# Patient Record
Sex: Female | Born: 1940 | Race: White | Hispanic: No | Smoking: Never smoker
Health system: Southern US, Community
[De-identification: ages and names within clinical notes are randomized; demographics above are authoritative.]

## PROBLEM LIST (undated history)

## (undated) DIAGNOSIS — Z79811 Long term (current) use of aromatase inhibitors: Secondary | ICD-10-CM

## (undated) DIAGNOSIS — E559 Vitamin D deficiency, unspecified: Secondary | ICD-10-CM

## (undated) DIAGNOSIS — C50919 Malignant neoplasm of unspecified site of unspecified female breast: Secondary | ICD-10-CM

## (undated) DIAGNOSIS — R112 Nausea with vomiting, unspecified: Secondary | ICD-10-CM

## (undated) DIAGNOSIS — R232 Flushing: Secondary | ICD-10-CM

## (undated) DIAGNOSIS — R0600 Dyspnea, unspecified: Secondary | ICD-10-CM

## (undated) DIAGNOSIS — E785 Hyperlipidemia, unspecified: Secondary | ICD-10-CM

## (undated) DIAGNOSIS — Z923 Personal history of irradiation: Secondary | ICD-10-CM

## (undated) DIAGNOSIS — C679 Malignant neoplasm of bladder, unspecified: Secondary | ICD-10-CM

## (undated) DIAGNOSIS — Z9889 Other specified postprocedural states: Secondary | ICD-10-CM

## (undated) DIAGNOSIS — K589 Irritable bowel syndrome without diarrhea: Secondary | ICD-10-CM

## (undated) DIAGNOSIS — B009 Herpesviral infection, unspecified: Secondary | ICD-10-CM

## (undated) DIAGNOSIS — M8580 Other specified disorders of bone density and structure, unspecified site: Secondary | ICD-10-CM

## (undated) DIAGNOSIS — M810 Age-related osteoporosis without current pathological fracture: Secondary | ICD-10-CM

## (undated) DIAGNOSIS — R7989 Other specified abnormal findings of blood chemistry: Secondary | ICD-10-CM

## (undated) DIAGNOSIS — L900 Lichen sclerosus et atrophicus: Secondary | ICD-10-CM

## (undated) DIAGNOSIS — I10 Essential (primary) hypertension: Secondary | ICD-10-CM

## (undated) DIAGNOSIS — K5790 Diverticulosis of intestine, part unspecified, without perforation or abscess without bleeding: Secondary | ICD-10-CM

## (undated) DIAGNOSIS — K219 Gastro-esophageal reflux disease without esophagitis: Secondary | ICD-10-CM

## (undated) DIAGNOSIS — N952 Postmenopausal atrophic vaginitis: Secondary | ICD-10-CM

## (undated) DIAGNOSIS — J383 Other diseases of vocal cords: Secondary | ICD-10-CM

## (undated) DIAGNOSIS — N816 Rectocele: Secondary | ICD-10-CM

## (undated) DIAGNOSIS — R945 Abnormal results of liver function studies: Secondary | ICD-10-CM

## (undated) HISTORY — PX: BLADDER TUMOR EXCISION: SHX238

## (undated) HISTORY — DX: Vitamin D deficiency, unspecified: E55.9

## (undated) HISTORY — DX: Flushing: R23.2

## (undated) HISTORY — DX: Other specified abnormal findings of blood chemistry: R79.89

## (undated) HISTORY — DX: Other diseases of vocal cords: J38.3

## (undated) HISTORY — DX: Irritable bowel syndrome, unspecified: K58.9

## (undated) HISTORY — PX: OTHER SURGICAL HISTORY: SHX169

## (undated) HISTORY — DX: Herpesviral infection, unspecified: B00.9

## (undated) HISTORY — PX: CHOLECYSTECTOMY: SHX55

## (undated) HISTORY — DX: Hyperlipidemia, unspecified: E78.5

## (undated) HISTORY — DX: Lichen sclerosus et atrophicus: L90.0

## (undated) HISTORY — DX: Postmenopausal atrophic vaginitis: N95.2

## (undated) HISTORY — DX: Malignant neoplasm of bladder, unspecified: C67.9

## (undated) HISTORY — PX: TONSILLECTOMY AND ADENOIDECTOMY: SUR1326

## (undated) HISTORY — DX: Abnormal results of liver function studies: R94.5

## (undated) HISTORY — DX: Dyspnea, unspecified: R06.00

## (undated) HISTORY — DX: Age-related osteoporosis without current pathological fracture: M81.0

## (undated) HISTORY — DX: Malignant neoplasm of unspecified site of unspecified female breast: C50.919

## (undated) HISTORY — DX: Diverticulosis of intestine, part unspecified, without perforation or abscess without bleeding: K57.90

## (undated) HISTORY — DX: Rectocele: N81.6

## (undated) HISTORY — PX: DILATION AND CURETTAGE OF UTERUS: SHX78

## (undated) HISTORY — PX: HYSTEROSCOPY: SHX211

## (undated) HISTORY — DX: Essential (primary) hypertension: I10

## (undated) HISTORY — PX: THROAT SURGERY: SHX803

## (undated) HISTORY — DX: Other specified disorders of bone density and structure, unspecified site: M85.80

## (undated) HISTORY — DX: Gastro-esophageal reflux disease without esophagitis: K21.9

---

## 1997-10-26 ENCOUNTER — Encounter: Payer: Self-pay | Admitting: Surgery

## 1997-10-26 ENCOUNTER — Ambulatory Visit (HOSPITAL_COMMUNITY): Admission: RE | Admit: 1997-10-26 | Discharge: 1997-10-27 | Payer: Self-pay | Admitting: Surgery

## 1997-11-09 ENCOUNTER — Ambulatory Visit (HOSPITAL_COMMUNITY): Admission: RE | Admit: 1997-11-09 | Discharge: 1997-11-09 | Payer: Self-pay | Admitting: Surgery

## 1997-11-10 ENCOUNTER — Ambulatory Visit (HOSPITAL_COMMUNITY): Admission: RE | Admit: 1997-11-10 | Discharge: 1997-11-10 | Payer: Self-pay | Admitting: Surgery

## 1998-04-15 ENCOUNTER — Other Ambulatory Visit: Admission: RE | Admit: 1998-04-15 | Discharge: 1998-04-15 | Payer: Self-pay | Admitting: Obstetrics and Gynecology

## 1998-11-30 ENCOUNTER — Encounter: Payer: Self-pay | Admitting: Urology

## 1998-12-01 ENCOUNTER — Ambulatory Visit (HOSPITAL_COMMUNITY): Admission: RE | Admit: 1998-12-01 | Discharge: 1998-12-01 | Payer: Self-pay | Admitting: Urology

## 1998-12-01 ENCOUNTER — Encounter (INDEPENDENT_AMBULATORY_CARE_PROVIDER_SITE_OTHER): Payer: Self-pay | Admitting: Specialist

## 1999-03-23 ENCOUNTER — Other Ambulatory Visit: Admission: RE | Admit: 1999-03-23 | Discharge: 1999-03-23 | Payer: Self-pay | Admitting: Urology

## 1999-05-03 ENCOUNTER — Other Ambulatory Visit: Admission: RE | Admit: 1999-05-03 | Discharge: 1999-05-03 | Payer: Self-pay | Admitting: Obstetrics and Gynecology

## 1999-08-24 ENCOUNTER — Other Ambulatory Visit: Admission: RE | Admit: 1999-08-24 | Discharge: 1999-08-24 | Payer: Self-pay | Admitting: Urology

## 2000-02-28 ENCOUNTER — Other Ambulatory Visit: Admission: RE | Admit: 2000-02-28 | Discharge: 2000-02-28 | Payer: Self-pay | Admitting: Obstetrics and Gynecology

## 2002-02-25 ENCOUNTER — Other Ambulatory Visit: Admission: RE | Admit: 2002-02-25 | Discharge: 2002-02-25 | Payer: Self-pay | Admitting: Urology

## 2005-11-08 ENCOUNTER — Encounter: Admission: RE | Admit: 2005-11-08 | Discharge: 2005-11-08 | Payer: Self-pay | Admitting: Internal Medicine

## 2006-07-05 ENCOUNTER — Ambulatory Visit: Payer: Self-pay | Admitting: Cardiology

## 2006-07-27 ENCOUNTER — Ambulatory Visit: Payer: Self-pay | Admitting: Cardiology

## 2006-08-01 ENCOUNTER — Ambulatory Visit: Payer: Self-pay | Admitting: Cardiology

## 2006-08-08 ENCOUNTER — Ambulatory Visit (HOSPITAL_COMMUNITY): Admission: RE | Admit: 2006-08-08 | Discharge: 2006-08-08 | Payer: Self-pay | Admitting: Obstetrics and Gynecology

## 2006-08-09 ENCOUNTER — Encounter: Admission: RE | Admit: 2006-08-09 | Discharge: 2006-08-09 | Payer: Self-pay | Admitting: Sports Medicine

## 2006-09-18 ENCOUNTER — Other Ambulatory Visit: Admission: RE | Admit: 2006-09-18 | Discharge: 2006-09-18 | Payer: Self-pay | Admitting: Obstetrics and Gynecology

## 2007-08-28 ENCOUNTER — Ambulatory Visit (HOSPITAL_COMMUNITY): Admission: RE | Admit: 2007-08-28 | Discharge: 2007-08-28 | Payer: Self-pay | Admitting: Internal Medicine

## 2007-09-05 ENCOUNTER — Encounter: Admission: RE | Admit: 2007-09-05 | Discharge: 2007-09-05 | Payer: Self-pay | Admitting: Internal Medicine

## 2007-12-09 ENCOUNTER — Ambulatory Visit: Payer: Self-pay | Admitting: Obstetrics and Gynecology

## 2008-05-13 ENCOUNTER — Encounter: Admission: RE | Admit: 2008-05-13 | Discharge: 2008-05-13 | Payer: Self-pay | Admitting: Neurology

## 2008-09-09 ENCOUNTER — Encounter (INDEPENDENT_AMBULATORY_CARE_PROVIDER_SITE_OTHER): Payer: Self-pay | Admitting: *Deleted

## 2008-09-10 ENCOUNTER — Encounter: Admission: RE | Admit: 2008-09-10 | Discharge: 2008-09-10 | Payer: Self-pay | Admitting: Obstetrics and Gynecology

## 2008-09-17 ENCOUNTER — Encounter: Admission: RE | Admit: 2008-09-17 | Discharge: 2008-09-17 | Payer: Self-pay | Admitting: Obstetrics and Gynecology

## 2008-09-22 ENCOUNTER — Other Ambulatory Visit: Admission: RE | Admit: 2008-09-22 | Discharge: 2008-09-22 | Payer: Self-pay | Admitting: Obstetrics and Gynecology

## 2008-09-22 ENCOUNTER — Encounter: Payer: Self-pay | Admitting: Obstetrics and Gynecology

## 2008-09-22 ENCOUNTER — Ambulatory Visit: Payer: Self-pay | Admitting: Obstetrics and Gynecology

## 2009-03-29 ENCOUNTER — Telehealth: Payer: Self-pay | Admitting: Gastroenterology

## 2009-09-21 ENCOUNTER — Encounter: Admission: RE | Admit: 2009-09-21 | Discharge: 2009-09-21 | Payer: Self-pay | Admitting: Internal Medicine

## 2009-10-01 ENCOUNTER — Ambulatory Visit: Payer: Self-pay | Admitting: Obstetrics and Gynecology

## 2010-04-03 ENCOUNTER — Encounter (HOSPITAL_BASED_OUTPATIENT_CLINIC_OR_DEPARTMENT_OTHER): Payer: Self-pay | Admitting: Internal Medicine

## 2010-04-14 NOTE — Progress Notes (Signed)
Summary: Schedule recall colon  Phone Note Outgoing Call Call back at [facility] Phone [phone]   Call placed by: Christie Nottingham CMA Duncan Dull),  March 29, 2009 9:34 AM Call placed to: Patient Summary of Call: called pt to schedule recall colonoscopy and pt's phone had no answer and no voicemail. Initial call taken by: Christie Nottingham CMA Duncan Dull),  March 29, 2009 9:34 AM  Follow-up for Phone Call        Left message on patients machine to call back.  Follow-up by: Harlow Mares CMA Duncan Dull),  April 07, 2009 9:49 AM

## 2010-06-08 ENCOUNTER — Encounter: Payer: Self-pay | Admitting: Cardiology

## 2010-06-29 ENCOUNTER — Ambulatory Visit (INDEPENDENT_AMBULATORY_CARE_PROVIDER_SITE_OTHER): Payer: Medicare Other | Admitting: Cardiology

## 2010-06-29 ENCOUNTER — Encounter: Payer: Self-pay | Admitting: Cardiology

## 2010-06-29 VITALS — BP 140/88 | HR 66 | Ht 62.0 in | Wt 189.0 lb

## 2010-06-29 DIAGNOSIS — R0600 Dyspnea, unspecified: Secondary | ICD-10-CM | POA: Insufficient documentation

## 2010-06-29 DIAGNOSIS — I10 Essential (primary) hypertension: Secondary | ICD-10-CM | POA: Insufficient documentation

## 2010-06-29 DIAGNOSIS — E785 Hyperlipidemia, unspecified: Secondary | ICD-10-CM | POA: Insufficient documentation

## 2010-06-29 DIAGNOSIS — R0989 Other specified symptoms and signs involving the circulatory and respiratory systems: Secondary | ICD-10-CM

## 2010-06-29 DIAGNOSIS — R232 Flushing: Secondary | ICD-10-CM

## 2010-06-29 MED ORDER — OLMESARTAN MEDOXOMIL-HCTZ 20-12.5 MG PO TABS: 1.0000 | ORAL_TABLET | Freq: Every day | ORAL | Status: DC

## 2010-06-29 NOTE — Progress Notes (Signed)
Joy Gonzales Date of Birth: 02-Nov-1940   History of Present Illness: Joy Gonzales is seen at the request of Dr. Eloise Harman for evaluation of labile hypertension and nausea. She is a pleasant 70 year old white female who reports that since 1995 she has had spikes in her blood pressure. Her blood pressure would get as high as 230/130. Since 1995 she would have episodes anywhere from 1-10 times per year. Often this would occur when she wakes up from sleep. It is worse if she has had a hard days' work. She did have a stress test in 1995 had a nuclear stress test in 2005 which were normal. Recently she experienced an episode where she felt very weak and shaky. She developed a dry mouth with a metallic taste and became very nauseated. Her blood pressure went up to 166/118 with a pulse of 75. 2 days later she had similar symptoms. Her medications were recently changed and since the change in her medication she is doing much better. She reports blood pressure readings of 100 systolic at home with diastolics of 68. In 2008 she did have VMA and metanephrine levels done which were normal. She does complain of dyspnea when she walks uphill. She denies any chest pain.  Current Outpatient Prescriptions on File Prior to Visit  Medication Sig Dispense Refill  . ALPRAZolam (XANAX) 0.25 MG tablet Take 0.25 mg by mouth daily as needed.        Marland Kitchen aspirin 81 MG tablet Take 81 mg by mouth daily.        Marland Kitchen esomeprazole (NEXIUM) 40 MG capsule Take 40 mg by mouth daily.        Marland Kitchen estradiol (ESTRING) 2 MG vaginal ring Place 2 mg vaginally as directed. follow package directions       . fenofibrate micronized (LOFIBRA) 200 MG capsule Take 200 mg by mouth daily.        . fexofenadine (ALLEGRA) 180 MG tablet Take 180 mg by mouth as needed.        . ranitidine (ZANTAC) 75 MG tablet Take 75 mg by mouth 2 (two) times daily.        Marland Kitchen topiramate (TOPAMAX) 25 MG tablet Take 25 mg by mouth daily.        . Multiple Vitamin (MULTIVITAMIN)  tablet Take 1 tablet by mouth daily.        Marland Kitchen olmesartan-hydrochlorothiazide (BENICAR HCT) 20-12.5 MG per tablet Take 1 tablet by mouth daily.  30 tablet  11  . DISCONTD: benazepril-hydrochlorthiazide (LOTENSIN HCT) 20-12.5 MG per tablet Take 1 tablet by mouth daily.         Allergies  Allergen Reactions  . Plendil     Past Medical History  Diagnosis Date  . HTN (hypertension)   . GERD (gastroesophageal reflux disease)   . Bladder cancer   . Hyperlipemia   . Elevated LFTs   . Tinnitus   . Flushing   . Diverticulosis   . Vocal cord granuloma   . Dyspnea     Past Surgical History  Procedure Date  . Tonsillectomy and adenoidectomy   . Throat surgery     LARYN. GRANULOMA  POLYP  . Colposcopy   . Cholecystectomy   . Bladder tumor excision     History  Smoking status  . Passive Smoker  Smokeless tobacco  . Not on file    History  Alcohol Use No    Family History  Problem Relation Age of Onset  . Cancer Father  ESOPHAGEAL  . Coronary artery disease Father   . Barrett's esophagus Brother   . Hypertension Brother   . Kidney cancer Sister   . Hypertension Sister   . Hypertension Sister     Review of Systems: The review of systems is positive for episodic nausea and vomiting associated with flushing and weakness. She has chronic hoarseness and has had 4 prior foot surgeries for granulomas. She states she is going to have another operation this year.  All other systems were reviewed and are negative.  Physical Exam: BP 140/88  Pulse 66  Ht 5\' 2"  (1.575 m)  Wt 189 lb (85.73 kg)  BMI 34.57 kg/m2 The patient is alert and oriented x 3.  The mood and affect are normal.  The skin is warm and dry.  Color is normal.  The HEENT exam reveals that the sclera are nonicteric.  The mucous membranes are moist.  The carotids are 2+ without bruits.  There is no thyromegaly.  There is no JVD.  The lungs are clear.  The chest wall is non tender.  The heart exam reveals a regular  rate with a normal S1 and S2.  There are no murmurs, gallops, or rubs.  The PMI is not displaced.   Abdominal exam reveals good bowel sounds.  There is no guarding or rebound.  There is no hepatosplenomegaly or tenderness.  There are no masses.  Exam of the legs reveal no clubbing, cyanosis, or edema.  The legs are without rashes.  The distal pulses are intact.  Cranial nerves II - XII are intact.  Motor and sensory functions are intact.  The gait is normal.  LABORATORY DATA: ECG today demonstrates normal sinus rhythm with a rate of 62 beats per minute and a normal ECG.  Assessment / Plan:

## 2010-06-29 NOTE — Assessment & Plan Note (Signed)
Symptoms are atypical but could represent an anginal equivalent. I recommended a stress Cardiolite study to rule out significant ischemia and to assess her left ventricular function.

## 2010-06-29 NOTE — Patient Instructions (Signed)
We will schedule you for a renal artery doppler to evaluate the possibility of renal artery stenosis.  We will check a blood test to rule excess serotonin as a cause of your elevated BP.  We will schedule you for a nuclear stress test.   Continue with your current medications.

## 2010-06-29 NOTE — Assessment & Plan Note (Signed)
She has labile blood pressure readings associated with flushing and nausea and vomiting. Evaluation for pheochromocytoma in the past have been negative. I recommended a renal duplex study to rule out renal artery stenosis. We will also obtain a serum serotonin level to rule out carcinoid disease. She appears to be an appropriate blood pressure medication at this time.

## 2010-07-04 ENCOUNTER — Other Ambulatory Visit (INDEPENDENT_AMBULATORY_CARE_PROVIDER_SITE_OTHER): Payer: Medicare Other | Admitting: *Deleted

## 2010-07-04 DIAGNOSIS — I10 Essential (primary) hypertension: Secondary | ICD-10-CM

## 2010-07-04 DIAGNOSIS — R232 Flushing: Secondary | ICD-10-CM

## 2010-07-04 DIAGNOSIS — E785 Hyperlipidemia, unspecified: Secondary | ICD-10-CM

## 2010-07-04 DIAGNOSIS — R0600 Dyspnea, unspecified: Secondary | ICD-10-CM

## 2010-07-08 ENCOUNTER — Telehealth: Payer: Self-pay | Admitting: *Deleted

## 2010-07-08 NOTE — Telephone Encounter (Signed)
Message copied by Murrell Redden on Fri Jul 08, 2010  9:16 AM ------      Message from: [place], PETER      Created: Thu Jul 07, 2010 12:54 PM       Normal serotonin level. Please report.

## 2010-07-08 NOTE — Telephone Encounter (Signed)
Notified of lab results. Will send to Dr, Eloise Harman.

## 2010-07-12 ENCOUNTER — Encounter: Payer: PRIVATE HEALTH INSURANCE | Admitting: *Deleted

## 2010-07-12 ENCOUNTER — Other Ambulatory Visit (HOSPITAL_COMMUNITY): Payer: PRIVATE HEALTH INSURANCE | Admitting: Radiology

## 2010-07-14 ENCOUNTER — Encounter: Payer: PRIVATE HEALTH INSURANCE | Admitting: Cardiology

## 2010-07-14 ENCOUNTER — Other Ambulatory Visit (HOSPITAL_COMMUNITY): Payer: PRIVATE HEALTH INSURANCE | Admitting: Radiology

## 2010-07-20 ENCOUNTER — Encounter (INDEPENDENT_AMBULATORY_CARE_PROVIDER_SITE_OTHER): Payer: Medicare Other | Admitting: Cardiology

## 2010-07-20 ENCOUNTER — Ambulatory Visit (HOSPITAL_COMMUNITY): Payer: Medicare Other | Attending: Cardiology | Admitting: Radiology

## 2010-07-20 VITALS — Ht 62.0 in | Wt 185.0 lb

## 2010-07-20 DIAGNOSIS — I4510 Unspecified right bundle-branch block: Secondary | ICD-10-CM

## 2010-07-20 DIAGNOSIS — R0600 Dyspnea, unspecified: Secondary | ICD-10-CM

## 2010-07-20 DIAGNOSIS — R0609 Other forms of dyspnea: Secondary | ICD-10-CM | POA: Insufficient documentation

## 2010-07-20 DIAGNOSIS — Z8249 Family history of ischemic heart disease and other diseases of the circulatory system: Secondary | ICD-10-CM | POA: Insufficient documentation

## 2010-07-20 DIAGNOSIS — R0989 Other specified symptoms and signs involving the circulatory and respiratory systems: Secondary | ICD-10-CM

## 2010-07-20 DIAGNOSIS — I10 Essential (primary) hypertension: Secondary | ICD-10-CM | POA: Insufficient documentation

## 2010-07-20 DIAGNOSIS — R5383 Other fatigue: Secondary | ICD-10-CM | POA: Insufficient documentation

## 2010-07-20 DIAGNOSIS — R112 Nausea with vomiting, unspecified: Secondary | ICD-10-CM | POA: Insufficient documentation

## 2010-07-20 DIAGNOSIS — R5381 Other malaise: Secondary | ICD-10-CM | POA: Insufficient documentation

## 2010-07-20 DIAGNOSIS — R0602 Shortness of breath: Secondary | ICD-10-CM | POA: Insufficient documentation

## 2010-07-20 DIAGNOSIS — I4949 Other premature depolarization: Secondary | ICD-10-CM

## 2010-07-20 MED ORDER — REGADENOSON 0.4 MG/5ML IV SOLN: 0.4000 mg | Freq: Once | INTRAVENOUS | Status: DC

## 2010-07-20 MED ORDER — TECHNETIUM TC 99M TETROFOSMIN IV KIT
11.0000 | PACK | Freq: Once | INTRAVENOUS | Status: AC | PRN
  Administered 2010-07-20: 11 via INTRAVENOUS

## 2010-07-20 MED ORDER — TECHNETIUM TC 99M TETROFOSMIN IV KIT
33.0000 | PACK | Freq: Once | INTRAVENOUS | Status: AC | PRN
  Administered 2010-07-20: 33 via INTRAVENOUS

## 2010-07-20 NOTE — Progress Notes (Signed)
[facility] SITE 3 NUCLEAR MED [address] [place] [address] [phone]  Cardiology Nuclear Med Study  Joy Gonzales is a 70 y.o. female [id number] [birth year]   Nuclear Med Background Indication for Stress Test:  Evaluation for Ischemia History: '05 Myocardial Perfusion Study: NL Cardiac Risk Factors: Family History - CAD, Hypertension and Lipids  Symptoms:  DOE, Fatigue, Nausea, SOB and Vomiting   Nuclear Pre-Procedure Caffeine/Decaff Intake:  None NPO After: 7:00pm   Lungs:  clear IV 0.9% NS with Angio Cath:  20g  IV Site: R Wrist  IV Started by:  Stanton Kidney, EMT-P  Chest Size (in):  40 Cup Size: C  Height: 5\' 2"  (1.575 m)  Weight:  185 lb (83.915 kg)  BMI:  Body mass index is 33.84 kg/(m^2). Tech Comments:  Toprol held > 24 hours, per patient.    Nuclear Med Study 1 or 2 day study: 1 day  Stress Test Type:  Stress  Reading MD: Marca Ancona, MD  Order Authorizing Provider:  P.Jordan  Resting Radionuclide: Technetium 57m Tetrofosmin  Resting Radionuclide Dose: 11.0 mCi   Stress Radionuclide:  Technetium 23m Tetrofosmin  Stress Radionuclide Dose: 33.0 mCi           Stress Protocol Rest HR:85 Stress HR: 148  Rest BP: 136/81 Stress BP: 193/100  Exercise Time (min): 4:35 METS: 6.4   Predicted Max HR: 149 bpm % Max HR: 99.33 bpm Rate Pressure Product: 21308   Dose of Adenosine (mg):  n/a Dose of Lexiscan: n/a mg  Dose of Atropine (mg): n/a Dose of Dobutamine: n/a mcg/kg/min (at max HR)  Stress Test Technologist: Milana Na, EMT-P  Nuclear Technologist:  Doyne Keel, CNMT     Rest Procedure:  Myocardial perfusion imaging was performed at rest 45 minutes following the intravenous administration of Technetium 77m Tetrofosmin. Rest ECG: NSR-RBBB  Stress Procedure:  The patient exercised for 4:35.  The patient stopped due to sob and denied any chest pain.  There were no significant ST-T wave changes.  Technetium 51m Tetrofosmin was  injected at peak exercise and myocardial perfusion imaging was performed after a brief delay. Stress ECG: Insignificant upsloping ST segment depression.  QPS Raw Data Images:  Two small nodular areas of radiotracer uptake on or near the surface of the left breast seen in stress planar image.  Stress Images:  Normal homogeneous uptake in all areas of the myocardium. Rest Images:  Normal homogeneous uptake in all areas of the myocardium. Subtraction (SDS):  There is no evidence of scar or ischemia. Transient Ischemic Dilatation (Normal <1.22):  0.74 Lung/Heart Ratio (Normal <0.45):  0.40  Quantitative Gated Spect Images QGS EDV:  37 ml QGS ESV:  4 ml QGS cine images:  Normal Wall Motion QGS EF: 89%  Impression Exercise Capacity:  Poor exercise capacity. BP Response:  Hypertensive blood pressure response. Clinical Symptoms:  Short of breath.  ECG Impression:  Insignificant upsloping ST segment depression.  Baseline RBBB.  Comparison with Prior Nuclear Study: No images to compare  Overall Impression:  Normal stress nuclear study.  There are two small nodular areas of radiotracer uptake on or near the surface of the left breast noted only on the stress planar image.   Maila Dukes [employer]

## 2010-07-21 ENCOUNTER — Encounter: Payer: Self-pay | Admitting: Cardiology

## 2010-07-21 NOTE — Progress Notes (Signed)
Normal nuclear study. Poor exercise tolerance. Needs to work on aerobic conditioning. Some uptake noted on left breast. Make sure she's up to date with mammogram. Copy to primary MD. Joy Gonzales

## 2010-07-21 NOTE — Progress Notes (Signed)
COPY ROUTED TO DR.JORDAN.Falecha Clark ° °

## 2010-07-21 NOTE — Progress Notes (Signed)
Notified of nuclear stress test results and lab work. Will see [place] 5/18

## 2010-07-26 NOTE — Assessment & Plan Note (Signed)
[facility]                            CARDIOLOGY OFFICE NOTE   NAME:Randon, [name]                          MRN:          [id number]  DATE:08/01/2006                            DOB:          01-Feb-1941    [name] comes in today for followup of her spells.  Please see my  extensive note from July 05, 2006.   A CardioNet monitor showed sinus rhythm with 4 different spells.  She  had 1 episode of heart racing where she had sinus tachycardia at a rate  of 108.  Otherwise, every symptom of heart racing or skipping was  associated with sinus rhythm.  This is a great relief, as I share with  her and her husband today.   Also, the Benicar hydrochlorothiazide, started by Dr. Eloise Harman, has  blunted her blood pressure during these spells to about 170.  This is a  remarkable improvement.   A 24 hour urinalysis was totally negative.  Her renal function is  normal.   I have had about a 20-minute discussion with the [facility].  I have  recommended Alprazolam, which she has prescribed p.r.n. for these  spells, or if she has had a particularly stressful day that might cause  her to wake up with a spell.  I have also advised her that she is not at  major risk for a stroke or serious cardiac problem based on what we are  seeing.  I have encouraged her to remain in a healthy state of mind and  do some traveling, which she and her husband like to do.  I will see her  back on a p.r.n. basis.     Thomas C. Daleen Squibb, MD, [facility]  Electronically Signed    TCW/MedQ  DD: 08/01/2006  DT: 08/01/2006  Job #: [id number]   cc:   Barry Dienes. Eloise Harman, M.D.

## 2010-07-29 ENCOUNTER — Ambulatory Visit (INDEPENDENT_AMBULATORY_CARE_PROVIDER_SITE_OTHER): Payer: Medicare Other | Admitting: Cardiology

## 2010-07-29 ENCOUNTER — Encounter: Payer: Self-pay | Admitting: Cardiology

## 2010-07-29 ENCOUNTER — Ambulatory Visit: Payer: Medicare Other | Admitting: Cardiology

## 2010-07-29 DIAGNOSIS — R0989 Other specified symptoms and signs involving the circulatory and respiratory systems: Secondary | ICD-10-CM

## 2010-07-29 DIAGNOSIS — I10 Essential (primary) hypertension: Secondary | ICD-10-CM

## 2010-07-29 DIAGNOSIS — R0600 Dyspnea, unspecified: Secondary | ICD-10-CM

## 2010-07-29 NOTE — Patient Instructions (Signed)
Continue Current medications.  We will see you back as needed.

## 2010-07-29 NOTE — Progress Notes (Signed)
Joy Gonzales Date of Birth: 08-17-1940   History of Present Illness: Joy Gonzales is seen 4 follow up today of her labile hypertension. She states she is feeling very well. She reports her blood pressure has been under good control on her Benicar HCT. She has had no further flushing episodes.  Current Outpatient Prescriptions on File Prior to Visit  Medication Sig Dispense Refill  . ALPRAZolam (XANAX) 0.25 MG tablet Take 0.25 mg by mouth daily as needed.        Marland Kitchen aspirin 81 MG tablet Take 81 mg by mouth daily.        Marland Kitchen esomeprazole (NEXIUM) 40 MG capsule Take 40 mg by mouth daily.        Marland Kitchen estradiol (ESTRING) 2 MG vaginal ring Place 2 mg vaginally as directed. follow package directions       . fenofibrate micronized (LOFIBRA) 200 MG capsule Take 200 mg by mouth daily.        . fexofenadine (ALLEGRA) 180 MG tablet Take 180 mg by mouth as needed.        Marland Kitchen olmesartan-hydrochlorothiazide (BENICAR HCT) 20-12.5 MG per tablet Take 1 tablet by mouth daily.  30 tablet  11  . ranitidine (ZANTAC) 75 MG tablet Take 75 mg by mouth at bedtime.       . topiramate (TOPAMAX) 25 MG tablet Take 25 mg by mouth daily.        Marland Kitchen DISCONTD: Multiple Vitamin (MULTIVITAMIN) tablet Take 1 tablet by mouth daily.          Allergies  Allergen Reactions  . Plendil     Past Medical History  Diagnosis Date  . HTN (hypertension)   . GERD (gastroesophageal reflux disease)   . Bladder cancer   . Hyperlipemia   . Elevated LFTs   . Tinnitus   . Flushing   . Diverticulosis   . Vocal cord granuloma   . Dyspnea     Past Surgical History  Procedure Date  . Tonsillectomy and adenoidectomy   . Throat surgery     LARYN. GRANULOMA  POLYP  . Colposcopy   . Cholecystectomy   . Bladder tumor excision     History  Smoking status  . Passive Smoker  Smokeless tobacco  . Not on file    History  Alcohol Use No    Family History  Problem Relation Age of Onset  . Cancer Father     ESOPHAGEAL  . Coronary  artery disease Father   . Barrett's esophagus Brother   . Hypertension Brother   . Kidney cancer Sister   . Hypertension Sister   . Hypertension Sister     Review of Systems: The review of systems is positive for increased hoarseness. She is planning to have repeat surgery for a granuloma on her vocal cord in [place]. All other systems were reviewed and are negative.  Physical Exam: BP 122/82  Pulse 60  Ht 5\' 2"  (1.575 m)  Wt 188 lb 12.8 oz (85.639 kg)  BMI 34.53 kg/m2 The patient is alert and oriented x 3.  The mood and affect are normal.  The skin is warm and dry.  Color is normal.  The HEENT exam reveals that the sclera are nonicteric.  The mucous membranes are moist.  The carotids are 2+ without bruits.  There is no thyromegaly.  There is no JVD.  The lungs are clear.  The chest wall is non tender.  The heart exam reveals a regular rate  with a normal S1 and S2.  There are no murmurs, gallops, or rubs.  The PMI is not displaced.   Abdominal exam reveals good bowel sounds.  There is no guarding or rebound.  There is no hepatosplenomegaly or tenderness.  There are no masses.  Exam of the legs reveal no clubbing, cyanosis, or edema.  The legs are without rashes.  The distal pulses are intact.  Cranial nerves II - XII are intact.  Motor and sensory functions are intact.  The gait is normal.  LABORATORY DATA: Serum serotonin level was normal. Renal artery ultrasound was negative for renal artery stenosis. Nuclear stress test was normal.  Assessment / Plan:

## 2010-07-29 NOTE — Assessment & Plan Note (Signed)
HEALTHCARE                            CARDIOLOGY OFFICE NOTE   NAME:Gonzales, Joy VASQUES                          MRN:          [id number]  DATE:07/05/2006                            DOB:          January 16, 1941    REASON FOR CONSULTATION:  I was asked by Dr. Dossie Arbour to evaluate  Joy Gonzales, a delightful 70 year old married white female, who has had  unexplained spells for the last 13 years.   Her spells always start with pain in the low center part of her back,  feeling like she is fading away, and then many times having a blood  pressure spike as high as 230/130. Her left arm starts to tingle. She  becomes short of breath and her vision sometimes gets blurred. She  denies any chest discomfort, per say. Has no diaphoresis, nausea, or  vomiting. She also has no headache with these spells. This has been  evaluated on a number of occasions including [facility] in  [place], when they lived at [facility], here at [facility]  as early as 1995, with nothing showing up. She has had EKG's, nuclear  stress study, and ambulatory blood pressure monitor with Dr. Eloise Harman.  These spells used to occur 3 to 4 times a year. Now, they are occurring  a couple of times a week.   She has had a long history of hypertension. Her blood pressure, she  says, is the best that it has ever been with Dr. Eloise Harman with the  Benicar/HCT.   She has had 24 hour urine collection in the distant past, which was  negative for any neurological hormonal disorders.   Recent blood work was unrevealing. Dr. Eloise Harman saw her today and is  going to collect another 24 hour urine specimen.   ALLERGIES:  She is intolerant of PLENDIL.   SOCIAL HISTORY:  She does not smoke and does not drink. Drinks 1 cup of  caffeinated beverage per day. She enjoys walking and working in her yard  and flowers. She has no exertional symptoms. Nothing to suggest ischemic  heart disease. She  moved back to [place] to be close to her  grandchildren. They lived in [place] for a number of years. She  retired in 1995. She has 2 children. Her husband is with her today and  is very knowledgeable and has taken extensive notes for the past number  of years for these events.   MEDICATIONS:  Include Benicar/HCT 20/12.5 daily, Aciphex 20 mg daily for  reflux, Ranitidine 75 mg p.o. q.h.s., Dicyclomine 10 mg 2 to 3 times per  week, Alprazolam 0.5 mg p.r.n.   PAST SURGICAL HISTORY:  She has had 4 surgeries of her vocal cords from  2005 to 2007.   FAMILY HISTORY:  Noncontributory.   REVIEW OF SYSTEMS:  Other than her HPI, is really negative. She has  occasional reflux. She has a history of some bladder cancer, apparently.   PHYSICAL EXAMINATION:  VITAL SIGNS:  Blood pressure 127/73, pulse 86 and  regular. She is 5 foot 2 and 3/4.  Weighs 178 pounds.  HEENT:  Normocephalic and atraumatic. PERRLA. Extraocular muscles  intact. Sclerae clear. Facial asymmetry is normal. There is no flushing  of the face. No malar rash.  NECK:  Supple. Carotid upstrokes equal bilaterally without bruits. No  jugular venous distention. Thyroid is not enlarged. Trachea is midline.  LUNGS:  Clear.  HEART:  Non-displaced PMI. She has normal S1 and S2 without murmur, rub,  or gallop.  ABDOMEN:  Soft. Good bowel sounds. There is no midline or frank bruit.  EXTREMITIES:  No clubbing, cyanosis, or edema. Pulses are present.  NEUROLOGIC:  Examination is intact.  SKIN:  Unremarkable.   DIAGNOSTIC STUDIES:  EKG shows sinus rhythm with a right bundle, which  is stable since October of 2006.   ASSESSMENT:  Spells associated with mid back discomfort, followed by  episodes of a hypertensive crisis, with negative workup to date. It  could be that her back discomfort triggers a vaso vagal event, without  syncope or pre-syncope. I suspect that her cardiac status is stable.  Neurologic endocrine issues need to be  ruled out per Dr. Eloise Harman. Also  need to be concerned about potential renal artery stenosis but this is  probably negative as well.  1. Hypertension, under good control at present.   PLAN:  1. Sublingual nitroglycerin 0.4 p.r.n. systolic blood pressure greater      than 200.  2. A 24 hour urine for Dr. Eloise Harman, for ruling out neurologic      endocrine abnormalities.  3. Cardiac monitor to record her heart rhythm and rate during these      episodes.  4. Followup with me again in 3 to 4 weeks.   I discussed this with Dr. Eloise Harman. We also will consider a renal  ultrasound to evaluate her aorta and renal vessels.     Thomas C. Daleen Squibb, MD, [facility]  Electronically Signed    TCW/MedQ  DD: 07/05/2006  DT: 07/05/2006  Job #: [id number]   cc:   Barry Dienes. Eloise Harman, M.D.

## 2010-07-29 NOTE — Assessment & Plan Note (Signed)
Normal stress nuclear study. I've reassured patient concerning these results.

## 2010-07-29 NOTE — Assessment & Plan Note (Signed)
Blood pressure is well controlled today. Her workup was negative for secondary causes of hypertension. She has essential hypertension. I would continue with her current medications and continue to stress the importance of sodium restriction and regular aerobic exercise. I will see her back as needed.

## 2010-08-19 ENCOUNTER — Other Ambulatory Visit (HOSPITAL_BASED_OUTPATIENT_CLINIC_OR_DEPARTMENT_OTHER): Payer: Self-pay | Admitting: Internal Medicine

## 2010-08-19 DIAGNOSIS — Z1231 Encounter for screening mammogram for malignant neoplasm of breast: Secondary | ICD-10-CM

## 2010-09-27 ENCOUNTER — Ambulatory Visit
Admission: RE | Admit: 2010-09-27 | Discharge: 2010-09-27 | Disposition: A | Discharge status: Home / Self Care | Payer: PRIVATE HEALTH INSURANCE | Source: Ambulatory Visit | Attending: Internal Medicine | Admitting: Internal Medicine

## 2010-09-27 DIAGNOSIS — Z1231 Encounter for screening mammogram for malignant neoplasm of breast: Secondary | ICD-10-CM

## 2010-10-11 ENCOUNTER — Ambulatory Visit (INDEPENDENT_AMBULATORY_CARE_PROVIDER_SITE_OTHER): Payer: Medicare Other | Admitting: Obstetrics and Gynecology

## 2010-10-11 ENCOUNTER — Encounter: Payer: Self-pay | Admitting: Obstetrics and Gynecology

## 2010-10-11 ENCOUNTER — Other Ambulatory Visit (HOSPITAL_COMMUNITY)
Admission: RE | Admit: 2010-10-11 | Discharge: 2010-10-11 | Disposition: A | Discharge status: Home / Self Care | Payer: Medicare Other | Source: Ambulatory Visit | Attending: Obstetrics and Gynecology | Admitting: Obstetrics and Gynecology

## 2010-10-11 VITALS — BP 130/80 | Ht 62.0 in | Wt 185.0 lb

## 2010-10-11 DIAGNOSIS — N762 Acute vulvitis: Secondary | ICD-10-CM

## 2010-10-11 DIAGNOSIS — M8580 Other specified disorders of bone density and structure, unspecified site: Secondary | ICD-10-CM

## 2010-10-11 DIAGNOSIS — B372 Candidiasis of skin and nail: Secondary | ICD-10-CM

## 2010-10-11 DIAGNOSIS — M899 Disorder of bone, unspecified: Secondary | ICD-10-CM

## 2010-10-11 DIAGNOSIS — N952 Postmenopausal atrophic vaginitis: Secondary | ICD-10-CM

## 2010-10-11 DIAGNOSIS — N760 Acute vaginitis: Secondary | ICD-10-CM

## 2010-10-11 DIAGNOSIS — Z01419 Encounter for gynecological examination (general) (routine) without abnormal findings: Secondary | ICD-10-CM

## 2010-10-11 DIAGNOSIS — Z124 Encounter for screening for malignant neoplasm of cervix: Secondary | ICD-10-CM | POA: Insufficient documentation

## 2010-10-11 DIAGNOSIS — M949 Disorder of cartilage, unspecified: Secondary | ICD-10-CM

## 2010-10-11 MED ORDER — CLOBETASOL PROPIONATE 0.05 % EX OINT: TOPICAL_OINTMENT | Freq: Two times a day (BID) | CUTANEOUS | Status: DC

## 2010-10-11 MED ORDER — OXICONAZOLE NITRATE 1 % EX LOTN: TOPICAL_LOTION | Freq: Two times a day (BID) | CUTANEOUS | Status: AC

## 2010-10-11 MED ORDER — ESTRADIOL 2 MG VA RING: 2.0000 mg | VAGINAL_RING | VAGINAL | Status: DC

## 2010-10-11 NOTE — Progress Notes (Signed)
Subjective:     Patient ID: Joy Gonzales, female   DOB: October 07, 1940, 70 y.o.   MRN: [id number]  HPIthe patient came back to see me today. She has a rash in areas of her body where there is overlapping of skin. It does itch. She does have low bone mass. She takes her calcium in her D. And has not had a fracture. She is due for her followup bone density. She is up-to-date on mammograms. Her hypertension is controlled by her internist. 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 She is having no vaginal bleeding and is using a vaginal ring with excellent results. Her rectocele does not give her any problems. She has no trouble eliminating stool. 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 She gets it around her mouth.    Review of Systems  Constitutional: Negative.   HENT: Negative.   Eyes: Negative.   Respiratory: Negative.   Cardiovascular: Negative.   Gastrointestinal: Positive for abdominal pain.  Musculoskeletal: Negative.   Skin: Positive for rash. Negative for wound.       Objective:   Physical ExamPhysical examination: HEENT within normal limits. Neck: Thyroid not large. No masses. Supraclavicular nodes: not enlarged. Breasts: Examined in both sitting midline position. No skin changes and no masses. Abdomen: Soft no guarding rebound or masses or hernia. Pelvic: External: Within normal limits. BUS: Within normal limits. Vaginal:within normal limits. Good estrogen effect. No evidence of cystocele  or enterocele, small rectocele. Cervix: clean. Uterus: Normal size and shape. Adnexa: No masses. Rectovaginal exam: Confirmatory and negative. Extremities: Within normal limits. Skin: It appears that she has a raised red rash. It looks most consistent with a skin yeast infection.    Assessment:   1. Atrophic vaginitis. 2. Osteopenia. 3. Skin  yeast infections. 4. Rectocele.    Plan:     She will continue her Estring vaginal ring. She will continue her Valtrex when she gets a recurrence. She will schedule bone density. She was treated with Oxistat.

## 2011-03-14 DIAGNOSIS — Z923 Personal history of irradiation: Secondary | ICD-10-CM

## 2011-03-14 HISTORY — DX: Personal history of irradiation: Z92.3

## 2011-04-13 ENCOUNTER — Ambulatory Visit (INDEPENDENT_AMBULATORY_CARE_PROVIDER_SITE_OTHER): Payer: Medicare Other

## 2011-04-13 ENCOUNTER — Ambulatory Visit (INDEPENDENT_AMBULATORY_CARE_PROVIDER_SITE_OTHER): Payer: Medicare Other | Admitting: Women's Health

## 2011-04-13 ENCOUNTER — Encounter: Payer: Self-pay | Admitting: Women's Health

## 2011-04-13 ENCOUNTER — Other Ambulatory Visit: Payer: Self-pay | Admitting: Obstetrics and Gynecology

## 2011-04-13 DIAGNOSIS — N950 Postmenopausal bleeding: Secondary | ICD-10-CM

## 2011-04-13 DIAGNOSIS — N898 Other specified noninflammatory disorders of vagina: Secondary | ICD-10-CM | POA: Diagnosis not present

## 2011-04-13 DIAGNOSIS — N83339 Acquired atrophy of ovary and fallopian tube, unspecified side: Secondary | ICD-10-CM

## 2011-04-13 DIAGNOSIS — N854 Malposition of uterus: Secondary | ICD-10-CM

## 2011-04-13 LAB — WET PREP FOR TRICH, YEAST, CLUE
Clue Cells Wet Prep HPF POC: NONE SEEN
Trich, Wet Prep: NONE SEEN
Yeast Wet Prep HPF POC: NONE SEEN

## 2011-04-13 MED ORDER — METRONIDAZOLE 0.75 % VA GEL: VAGINAL | Status: AC

## 2011-04-13 NOTE — Progress Notes (Signed)
Patient ID: GRAVIELA NODAL, female   DOB: 11-25-1940, 71 y.o.   MRN: [id number] Presents with a complaint of vaginal spotting and some abdominal cramping. Has used Estring for approximately 2-3 years without problem or bleeding. History of rectocele, lichen sclerosis, IBS and hypertension. Denies urinary symptoms, fever, or discharge.  Exam: Abdomen soft nontender, external genitalia lichen sclerosis at posterior fornix. Speculum exam moderate amount of yellow blood-streaked discharge, no odor. Cervix without lesion or polyp, vaginal walls without visible bleeding. Wet prep negative for yeast, positive for many bacteria, no clue cells. Ultrasound: Retroverted uterus, endometrium 3.6 mm with fluid noted in endometrial cavity 9x12 x 5 mm, right and left ovary atrophic. Left adnexal 12 x 10 mm pocket of free fluid. Negative cul-de-sac  Postmenopausal spotting Vaginal bacterial infection  Plan: MetroGel vaginal cream 1 applicator at bedtime for 2-3 nights. Schedule sonohystogram/bx  with Dr. Eda Paschal.

## 2011-05-09 ENCOUNTER — Other Ambulatory Visit: Payer: Self-pay | Admitting: Obstetrics and Gynecology

## 2011-05-09 DIAGNOSIS — N950 Postmenopausal bleeding: Secondary | ICD-10-CM

## 2011-05-11 ENCOUNTER — Ambulatory Visit: Payer: Medicare Other | Admitting: Obstetrics and Gynecology

## 2011-05-11 ENCOUNTER — Ambulatory Visit (INDEPENDENT_AMBULATORY_CARE_PROVIDER_SITE_OTHER): Payer: Medicare Other

## 2011-05-11 ENCOUNTER — Other Ambulatory Visit: Payer: Medicare Other

## 2011-05-11 ENCOUNTER — Ambulatory Visit (INDEPENDENT_AMBULATORY_CARE_PROVIDER_SITE_OTHER): Payer: Medicare Other | Admitting: Obstetrics and Gynecology

## 2011-05-11 DIAGNOSIS — N950 Postmenopausal bleeding: Secondary | ICD-10-CM

## 2011-05-11 NOTE — Progress Notes (Addendum)
Patient came in today for saline infusion histogram because of postmenopausal bleeding not associated with hormone replacement therapy. At the end of January she had an ultrasound which did not reveal pathology but she is spotted off and on for 3 weeks although not since the ultrasound. Her ultrasound was repeated today and is similar to the previous one( see report). On ultrasound the uterus remains retroverted with fluid in the endometrial cavity. The endometrial cavity was 4.1 mm. The posterior wall the cavity appears increase from prior exam. Both adnexa are normal. The patient has a severely retroverted uterus with a large rectocele and with much effort we were able to expose the cervix but never were able to grasp it firmly enough to allow [place] to finish the procedure. We changed the patient's position. We used different instrumentation. We spent 20+ minutes doing this but were never successful. I then talked to the patient in depth about what we should do next. I told her I was not overly suspicious but felt short of a hysteroscopy with biopsy under anesthesia we were at some risk of missing something important. She understands and is comfortable with [place] proceeding with day surgery. 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

## 2011-05-12 ENCOUNTER — Telehealth: Payer: Self-pay

## 2011-05-12 ENCOUNTER — Encounter (HOSPITAL_COMMUNITY): Payer: Self-pay

## 2011-05-12 NOTE — Telephone Encounter (Signed)
Patient called me back and asked about next Th or Fri for her surgery.  I scheduled her for Thursday, March 7 at 7:30am at [facility].  She will wait to hear from Gaylyn Rong in preadmissions dept to set her up for a consult with anesthesia per Dr. Timoteo Expose request.  That will be ahead of surgery day.

## 2011-05-12 NOTE — Telephone Encounter (Signed)
Left message for patient to call me regarding scheduling her outpatient surgery and to advise me what dates might work for her.

## 2011-05-15 NOTE — H&P (Signed)
Chief complaint: postmenopausal bleeding                                                                                                                                                                                                                                                                        HPI:  Pt is a 71 year old G2P2 with a three week history of postmenopausal bleeding. The patient is not on hormone replacement therapy. Ultrasound done in the office showed no pathology except for an enlarged endometrial cavity. An attempt was made to do a saline infusion hystogram with an endometrial biopsy but was unsuccessful due to cervical stenosis, severly retroverted uterus and large rectocele making cerivical exposure difficult. She now enters the operating room for hysterscopy, endometrial sampling and excision of any intrauterine pathology.                                                                                                                                                                                                                                       PMH, FH, SH, and ROS is in epic record.  Physical examination: HEENT within normal limits. Neck: Thyroid not large. No masses. Supraclavicular nodes: not enlarged. Breasts: Examined in both sitting midline position. No skin changes and no masses. Abdomen: Soft no guarding rebound or masses or hernia. Pelvic: External: Within normal limits. BUS: Within normal limits. Vaginal:within normal limits. Good estrogen effect. No evidence of cystocele rectocele or enterocele. Cervix: clean. Uterus: Normal size and shape. Adnexa: No masses. Rectovaginal exam: Confirmatory and  negative. Extremities: Within normal limits.                                                                                                                                                                                                                                                                                              Assessment: Postmenopausal bleeding                                                                                                      Plan: D&C, Hysterscopy

## 2011-05-17 ENCOUNTER — Encounter (HOSPITAL_COMMUNITY): Payer: Self-pay

## 2011-05-17 ENCOUNTER — Other Ambulatory Visit: Payer: Self-pay

## 2011-05-17 ENCOUNTER — Encounter (HOSPITAL_COMMUNITY)
Admission: RE | Admit: 2011-05-17 | Discharge: 2011-05-17 | Disposition: A | Discharge status: Home / Self Care | Payer: Medicare Other | Source: Ambulatory Visit | Attending: Obstetrics and Gynecology | Admitting: Obstetrics and Gynecology

## 2011-05-17 DIAGNOSIS — N950 Postmenopausal bleeding: Secondary | ICD-10-CM | POA: Diagnosis not present

## 2011-05-17 DIAGNOSIS — Z01818 Encounter for other preprocedural examination: Secondary | ICD-10-CM | POA: Diagnosis not present

## 2011-05-17 DIAGNOSIS — Z01812 Encounter for preprocedural laboratory examination: Secondary | ICD-10-CM | POA: Diagnosis not present

## 2011-05-17 LAB — CBC
HCT: 45.8 % (ref 36.0–46.0)
MCHC: 33.8 g/dL (ref 30.0–36.0)
MCV: 91.8 fL (ref 78.0–100.0)
RDW: 12.8 % (ref 11.5–15.5)
WBC: 7.9 10*3/uL (ref 4.0–10.5)

## 2011-05-17 LAB — BASIC METABOLIC PANEL
BUN: 19 mg/dL (ref 6–23)
Chloride: 102 mEq/L (ref 96–112)
Creatinine, Ser: 0.8 mg/dL (ref 0.50–1.10)
GFR calc Af Amer: 83 mL/min — ABNORMAL LOW (ref >90)
Glucose, Bld: 94 mg/dL (ref 70–99)

## 2011-05-17 MED ORDER — CEFAZOLIN SODIUM-DEXTROSE 2-3 GM-% IV SOLR
2.0000 g | INTRAVENOUS | Status: AC
  Administered 2011-05-18: 1 g via INTRAVENOUS
  Filled 2011-05-17: qty 50

## 2011-05-17 NOTE — Anesthesia Preprocedure Evaluation (Signed)
Anesthesia Evaluation  Patient identified by MRN, date of birth, ID band Patient awake    Reviewed: Allergy & Precautions, H&P , NPO status , Patient's Chart, lab work & pertinent test results  Airway Mallampati: II      Dental No notable dental hx.    Pulmonary neg pulmonary ROS, shortness of breath,  breath sounds clear to auscultation  Pulmonary exam normal       Cardiovascular Exercise Tolerance: Good hypertension, negative cardio ROS  Rhythm:regular Rate:Normal     Neuro/Psych negative neurological ROS  negative psych ROS   GI/Hepatic negative GI ROS, Neg liver ROS, GERD-  ,  Endo/Other  negative endocrine ROS  Renal/GU negative Renal ROS  negative genitourinary   Musculoskeletal   Abdominal Normal abdominal exam  (+)   Peds  Hematology negative hematology ROS (+)   Anesthesia Other Findings HTN (hypertension)     GERD (gastroesophageal reflux disease)        Hyperlipemia     Elevated LFTs        Tinnitus     Flushing        Diverticulosis     Vocal cord granuloma        IBS (irritable bowel syndrome)     Rectocele        Lichen sclerosus     Atrophic vaginitis        HSV-1 infection     Osteopenia        Dyspnea   on exertion Bladder cancer    Reproductive/Obstetrics (+) Pregnancy                           Anesthesia Physical Anesthesia Plan  ASA: II  Anesthesia Plan: Spinal   Post-op Pain Management:    Induction:   Airway Management Planned:   Additional Equipment:   Intra-op Plan:   Post-operative Plan:   Informed Consent: I have reviewed the patients History and Physical, chart, labs and discussed the procedure including the risks, benefits and alternatives for the proposed anesthesia with the patient or authorized representative who has indicated his/her understanding and acceptance.     Plan Discussed with: Anesthesiologist, CRNA and Surgeon  Anesthesia  Plan Comments:         Anesthesia Quick Evaluation

## 2011-05-17 NOTE — Patient Instructions (Signed)
YOUR PROCEDURE IS SCHEDULED ON:05/18/11  ENTER THROUGH THE MAIN ENTRANCE OF [facility] AT:6am  USE DESK PHONE AND DIAL [id number] TO INFORM us OF YOUR ARRIVAL  CALL [phone] IF YOU HAVE ANY QUESTIONS OR PROBLEMS PRIOR TO YOUR ARRIVAL.  REMEMBER: DO NOT EAT OR DRINK AFTER MIDNIGHT :tonight  SPECIAL INSTRUCTIONS:   YOU MAY BRUSH YOUR TEETH THE MORNING OF SURGERY   TAKE THESE MEDICINES THE DAY OF SURGERY WITH SIP OF WATER:BP meds, Nexium   DO NOT WEAR JEWELRY, EYE MAKEUP, LIPSTICK OR DARK FINGERNAIL POLISH DO NOT WEAR LOTIONS  DO NOT SHAVE FOR 48 HOURS PRIOR TO SURGERY  YOU WILL NOT BE ALLOWED TO DRIVE YOURSELF HOME.  NAME OF DRIVER: Onalee Hua

## 2011-05-18 ENCOUNTER — Ambulatory Visit (HOSPITAL_COMMUNITY): Payer: Medicare Other | Admitting: Anesthesiology

## 2011-05-18 ENCOUNTER — Ambulatory Visit (HOSPITAL_COMMUNITY)
Admission: RE | Admit: 2011-05-18 | Discharge: 2011-05-18 | Disposition: A | Discharge status: Home / Self Care | Payer: Medicare Other | Source: Ambulatory Visit | Attending: Obstetrics and Gynecology | Admitting: Obstetrics and Gynecology

## 2011-05-18 ENCOUNTER — Encounter (HOSPITAL_COMMUNITY): Payer: Self-pay | Admitting: Anesthesiology

## 2011-05-18 ENCOUNTER — Encounter (HOSPITAL_COMMUNITY): Payer: Self-pay | Admitting: *Deleted

## 2011-05-18 ENCOUNTER — Encounter (HOSPITAL_COMMUNITY)
Admission: RE | Disposition: A | Discharge status: Home / Self Care | Payer: Self-pay | Source: Ambulatory Visit | Attending: Obstetrics and Gynecology

## 2011-05-18 DIAGNOSIS — Z01812 Encounter for preprocedural laboratory examination: Secondary | ICD-10-CM | POA: Insufficient documentation

## 2011-05-18 DIAGNOSIS — N950 Postmenopausal bleeding: Secondary | ICD-10-CM

## 2011-05-18 DIAGNOSIS — Z01818 Encounter for other preprocedural examination: Secondary | ICD-10-CM | POA: Insufficient documentation

## 2011-05-18 HISTORY — PX: HYSTEROSCOPY W/D&C: SHX1775

## 2011-05-18 SURGERY — DILATATION AND CURETTAGE /HYSTEROSCOPY
Anesthesia: Spinal

## 2011-05-18 MED ORDER — ONDANSETRON HCL 4 MG/2ML IJ SOLN
INTRAMUSCULAR | Status: AC
  Filled 2011-05-18: qty 2

## 2011-05-18 MED ORDER — MIDAZOLAM HCL 2 MG/2ML IJ SOLN
INTRAMUSCULAR | Status: AC
  Filled 2011-05-18: qty 2

## 2011-05-18 MED ORDER — FENTANYL CITRATE 0.05 MG/ML IJ SOLN
INTRAMUSCULAR | Status: AC
  Filled 2011-05-18: qty 2

## 2011-05-18 MED ORDER — LACTATED RINGERS IV SOLN
INTRAVENOUS | Status: DC
  Administered 2011-05-18 (×2): via INTRAVENOUS
  Administered 2011-05-18: 125 mL/h via INTRAVENOUS

## 2011-05-18 MED ORDER — PHENYLEPHRINE 40 MCG/ML (10ML) SYRINGE FOR IV PUSH (FOR BLOOD PRESSURE SUPPORT)
PREFILLED_SYRINGE | INTRAVENOUS | Status: AC
  Filled 2011-05-18: qty 5

## 2011-05-18 MED ORDER — PROPOFOL 10 MG/ML IV EMUL: INTRAVENOUS | Status: DC | PRN

## 2011-05-18 MED ORDER — ACETAMINOPHEN 325 MG PO TABS: 325.0000 mg | ORAL_TABLET | ORAL | Status: DC | PRN

## 2011-05-18 MED ORDER — PHENYLEPHRINE HCL 10 MG/ML IJ SOLN
INTRAMUSCULAR | Status: DC | PRN
  Administered 2011-05-18 (×4): 40 ug via INTRAVENOUS

## 2011-05-18 MED ORDER — IBUPROFEN 600 MG PO TABS
600.0000 mg | ORAL_TABLET | ORAL | Status: AC
  Administered 2011-05-18: 600 mg via ORAL

## 2011-05-18 MED ORDER — KETOROLAC TROMETHAMINE 30 MG/ML IJ SOLN: 15.0000 mg | Freq: Once | INTRAMUSCULAR | Status: DC | PRN

## 2011-05-18 MED ORDER — PROPOFOL 10 MG/ML IV BOLUS
INTRAVENOUS | Status: DC | PRN
  Administered 2011-05-18 (×5): 20 mg via INTRAVENOUS

## 2011-05-18 MED ORDER — PROPOFOL 10 MG/ML IV EMUL
INTRAVENOUS | Status: AC
  Filled 2011-05-18: qty 20

## 2011-05-18 MED ORDER — ONDANSETRON HCL 4 MG/2ML IJ SOLN
INTRAMUSCULAR | Status: DC | PRN
  Administered 2011-05-18: 4 mg via INTRAVENOUS

## 2011-05-18 MED ORDER — LIDOCAINE HCL (CARDIAC) 20 MG/ML IV SOLN
INTRAVENOUS | Status: AC
  Filled 2011-05-18: qty 5

## 2011-05-18 MED ORDER — IBUPROFEN 600 MG PO TABS
ORAL_TABLET | ORAL | Status: AC
  Administered 2011-05-18: 600 mg via ORAL
  Filled 2011-05-18: qty 1

## 2011-05-18 MED ORDER — FENTANYL CITRATE 0.05 MG/ML IJ SOLN: 25.0000 ug | INTRAMUSCULAR | Status: DC | PRN

## 2011-05-18 SURGICAL SUPPLY — 16 items
CANISTER SUCTION 2500CC (MISCELLANEOUS) ×2 IMPLANT
CLOTH BEACON ORANGE TIMEOUT ST (SAFETY) ×2 IMPLANT
CONTAINER PREFILL 10% NBF 60ML (FORM) ×4 IMPLANT
DILATOR CANAL MILEX (MISCELLANEOUS) ×2 IMPLANT
ELECT REM PT RETURN 9FT ADLT (ELECTROSURGICAL)
ELECTRODE REM PT RTRN 9FT ADLT (ELECTROSURGICAL) IMPLANT
GLOVE BIOGEL PI IND STRL 7.5 (GLOVE) ×2 IMPLANT
GLOVE BIOGEL PI INDICATOR 7.5 (GLOVE) ×2
GLOVE ECLIPSE 7.0 STRL STRAW (GLOVE) ×2 IMPLANT
GOWN PREVENTION PLUS LG XLONG (DISPOSABLE) ×4 IMPLANT
GOWN STRL REIN XL XLG (GOWN DISPOSABLE) ×2 IMPLANT
LOOP ANGLED CUTTING 22FR (CUTTING LOOP) IMPLANT
PACK HYSTEROSCOPY LF (CUSTOM PROCEDURE TRAY) ×2 IMPLANT
SYR 20CC LL (SYRINGE) ×2 IMPLANT
TOWEL OR 17X24 6PK STRL BLUE (TOWEL DISPOSABLE) ×4 IMPLANT
WATER STERILE IRR 1000ML POUR (IV SOLUTION) ×2 IMPLANT

## 2011-05-18 NOTE — Transfer of Care (Signed)
Immediate Anesthesia Transfer of Care Note  Patient: Joy Gonzales  Procedure(s) Performed: Procedure(s) (LRB): DILATATION AND CURETTAGE /HYSTEROSCOPY (N/A)  Patient Location: PACU  Anesthesia Type: Spinal  Level of Consciousness: awake, alert , oriented and patient cooperative  Airway & Oxygen Therapy: Patient Spontanous Breathing and Patient connected to nasal cannula oxygen  Post-op Assessment: Report given to PACU RN and Post -op Vital signs reviewed and stable  Post vital signs: Reviewed and stable  Complications: No apparent anesthesia complications

## 2011-05-18 NOTE — Interval H&P Note (Signed)
History and Physical Interval Note:  05/18/2011 6:58 AM  Joy Gonzales  has presented today for surgery, with the diagnosis of post menopausal bleeding  The various methods of treatment have been discussed with the patient and family. After consideration of risks, benefits and other options for treatment, the patient has consented to  Procedure(s) (LRB): DILATATION AND CURETTAGE /HYSTEROSCOPY (N/A) as a surgical intervention .  The patients' history has been reviewed, patient examined, no change in status, stable for surgery.  I have reviewed the patients' chart and labs.  Questions were answered to the patient's satisfaction.     [name]  Date of Initial H&P: 05/15/2011  History reviewed, patient examined, no change in status, stable for surgery.

## 2011-05-18 NOTE — Progress Notes (Signed)
Attempted to ambulate patient to bathroom.  Unable to bear weight enough to walk safely.  Reclined back on stretcher.  Will try to ambulate late.

## 2011-05-18 NOTE — Anesthesia Postprocedure Evaluation (Signed)
Anesthesia Post Note  Patient: Joy Gonzales  Procedure(s) Performed: Procedure(s) (LRB): DILATATION AND CURETTAGE /HYSTEROSCOPY (N/A)  Anesthesia type: Spinal  Patient location: PACU  Post pain: Pain level controlled  Post assessment: Post-op Vital signs reviewed  Last Vitals:  Filed Vitals:   05/18/11 1030  BP: 111/50  Pulse: 72  Temp:   Resp: 23    Post vital signs: Reviewed  Level of consciousness: awake  Complications: No apparent anesthesia complications

## 2011-05-18 NOTE — Preoperative (Signed)
Beta Blockers   Reason not to administer Beta Blockers:medication taken prior to admission per daily order by patient

## 2011-05-18 NOTE — Op Note (Signed)
Operative note  Preoperative diagnosis: Postmenopausal bleeding Postoperative diagnosis: Postmenopausal bleeding Operation: Hysteroscopy, endometrial sampling Surgeon: Dr. Eda Paschal  Findings: Patient had a normal size, severely retroverted uterus. Patient had a large rectocele. No adnexal masses were palpable. External and the B US exam normal. Vagina normal except for rectocele. Cervix normal except for severe cervical stenosis. At the time of hysteroscopy top of the fundus, tubal ostia, anterior and posterior walls of the fundus, lower uterine segment, and endocervical canal were all adequately seen and were without pathology. Patient's endometrium was very atrophic.  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 Anesthesia was excellent. The patient's Estring was then removed and was to be given back to the patient. The patient was then prepped and draped in usual sterile manner. A weighted speculum was placed posteriorly to keep the rectocele out of the field. A Sims retractor was placed anteriorly. A single-tooth tenaculum was placed on the anterior lip of the cervix. Initially it was very difficult to open the external os with a dilator. Finally with small dilators this could be accomplished and the cervix could be adequately dilated. A diagnostic hysteroscope was introduced. One and a half percent glycine was used expand the  intrauterine cavity. An excellent view of the endometrium and intra-cervical canal was obtained. The patient's endometrium was atrophic. There was no pathology present. The hysteroscope was removed. Endometrial sampling was obtained with a sharp curette. Curettings were scant consistent with an atrophic endometrium. Curettings were sent to pathology for tissue diagnosis. The patient was then re hysteroscoped to be sure no pathology was missed and no perforation had occurred. All was still normal. Fluid deficit was minimal. The procedure was  terminated. Blood loss was minimal. The patient left the operating room in satisfactory condition.

## 2011-05-18 NOTE — Discharge Instructions (Signed)
Hysteroscopy Hysteroscopy is a procedure used for looking inside the womb (uterus). It may be done for many different reasons, including:  To evaluate abnormal bleeding, fibroid (benign, noncancerous) tumors, polyps, scar tissue (adhesions), and possibly cancer of the uterus.   To look for lumps (tumors) and other uterine growths.   To look for causes of why a woman cannot get pregnant (infertility), causes of recurrent loss of pregnancy (miscarriages), or a lost intrauterine device (IUD).   To perform a sterilization by blocking the fallopian tubes from inside the uterus.  A hysteroscopy should be done right after a menstrual period to be sure you are not pregnant. LET YOUR CAREGIVER KNOW ABOUT:   Allergies.   Medicines taken, including herbs, eyedrops, over-the-counter medicines, and creams.   Use of steroids (by mouth or creams).   Previous problems with anesthetics or numbing medicines.   History of bleeding or blood problems.   History of blood clots.   Possibility of pregnancy, if this applies.   Previous surgery.   Other health problems.  RISKS AND COMPLICATIONS   Putting a hole in the uterus.   Excessive bleeding.   Infection.   Damage to the cervix.   Injury to other organs.   Allergic reaction to medicines.   Too much fluid used in the uterus for the procedure.  BEFORE THE PROCEDURE   Do not take aspirin or blood thinners for a week before the procedure, or as directed. It can cause bleeding.   Arrive at least 60 minutes before the procedure or as directed to read and sign the necessary forms.   Arrange for someone to take you home after the procedure.   If you smoke, do not smoke for 2 weeks before the procedure.  PROCEDURE   Your caregiver may give you medicine to relax you. He or she may also give you a medicine that numbs the area around the cervix (local anesthetic) or a medicine that makes you sleep (general anesthesia).   Sometimes, a  medicine is placed in the cervix the day before the procedure. This medicine makes the cervix have a larger opening (dilate). This makes it easier for the instrument to be inserted into the uterus.   A small instrument (hysteroscope) is inserted through the vagina into the uterus. This instrument is similar to a pencil-sized telescope with a light.   During the procedure, air or a liquid is put into the uterus, which allows the surgeon to see better.   Sometimes, tissue is gently scraped from inside the uterus. These tissue samples are sent to a specialist who looks at tissue samples (pathologist). The pathologist will give a report to your caregiver. This will help your caregiver decide if further treatment is necessary. The report will also help your caregiver decide on the best treatment if the test comes back abnormal.  AFTER THE PROCEDURE   If you had a general anesthetic, you may be groggy for a couple hours after the procedure.   If you had a local anesthetic, you will be advised to rest at the surgical center or caregiver's office until you are stable and feel ready to go home.   You may have some cramping for a couple days.   You may have bleeding, which varies from light spotting for a few days to menstrual-like bleeding for up to 3 to 7 days. This is normal.   Have someone take you home.  FINDING OUT THE RESULTS OF YOUR TEST Not all test results   are available during your visit. If your test results are not back during the visit, make an appointment with your caregiver to find out the results. Do not assume everything is normal if you have not heard from your caregiver or the medical facility. It is important for you to follow up on all of your test results. HOME CARE INSTRUCTIONS   Do not drive for 24 hours or as instructed.   Only take over-the-counter or prescription medicines for pain, discomfort, or fever as directed by your caregiver.   Do not take aspirin. It can cause or  aggravate bleeding.   Do not drive or drink alcohol while taking pain medicine.   You may resume your usual diet.   Do not use tampons, douche, or have sexual intercourse for 2 weeks, or as advised by your caregiver.   Rest and sleep for the first 24 to 48 hours.   Take your temperature twice a day for 4 to 5 days. Write it down. Give these temperatures to your caregiver if they are abnormal (above 98.6 F or 37.0 C).   Take medicines your caregiver has ordered as directed.   Follow your caregiver's advice regarding diet, exercise, lifting, driving, and general activities.   Take showers instead of baths for 2 weeks, or as recommended by your caregiver.   If you develop constipation:   Take a mild laxative with the advice of your caregiver.   Eat bran foods.   Drink enough water and fluids to keep your urine clear or pale yellow.   Try to have someone with you or available to you for the first 24 to 48 hours, especially if you had a general anesthetic.   Make sure you and your family understand everything about your operation and recovery.   Follow your caregiver's advice regarding follow-up appointments and Pap smears.  SEEK MEDICAL CARE IF:   You feel dizzy or lightheaded.   You feel sick to your stomach (nauseous).   You develop abnormal vaginal discharge.   You develop a rash.   You have an abnormal reaction or allergy to your medicine.   You need stronger pain medicine.  SEEK IMMEDIATE MEDICAL CARE IF:   Bleeding is heavier than a normal menstrual period or you have blood clots.   You have an oral temperature above 102 F (38.9 C), not controlled by medicine.   You have increasing cramps or pains not relieved with medicine.   You develop belly (abdominal) pain that does not seem to be related to the same area of earlier cramping and pain.   You pass out.   You develop pain in the tops of your shoulders (shoulder strap areas).   You develop shortness of  breath.  MAKE SURE YOU:   Understand these instructions.   Will watch your condition.   Will get help right away if you are not doing well or get worse.  Document Released: 06/05/2000 Document Revised: 02/16/2011 Document Reviewed: 09/28/2008 [employer] Patient Information 2012 [employer]. 

## 2011-05-22 ENCOUNTER — Encounter (HOSPITAL_COMMUNITY): Payer: Self-pay | Admitting: Obstetrics and Gynecology

## 2011-06-02 ENCOUNTER — Ambulatory Visit (INDEPENDENT_AMBULATORY_CARE_PROVIDER_SITE_OTHER): Payer: Medicare Other | Admitting: Obstetrics and Gynecology

## 2011-06-02 ENCOUNTER — Encounter: Payer: Self-pay | Admitting: Obstetrics and Gynecology

## 2011-06-02 DIAGNOSIS — N63 Unspecified lump in unspecified breast: Secondary | ICD-10-CM | POA: Diagnosis not present

## 2011-06-02 NOTE — Progress Notes (Signed)
Patient came to see me today for 2 reasons. One was that this was a postoperative visit after hysteroscopy for postmenopausal bleeding. Findings at the time of surgery were normal. Endometrial biopsy came back normal. Patient has had no further bleeding. The patient needed to come today also because she is felt a lump in her right breast. It has been there for about a month. She thinks it's gotten a little bit bigger. She had her last mammogram in July, 2012  Exam: Joy Gonzales present. Breasts: Examined in both the sitting and lying position. There are no skin changes. Her left breast is normal. In her right breast at 12:00 one to 2 finger breaths above the nipple there is a slightly oblong mass of one to one and a half centimeters which is the dominant lesion.  Assessment: #1. New breast mass #2. Normal postoperative course  Plan: We will send the patient to the breast Center for diagnostic mammogram and ultrasound. She will report new vaginal bleeding to me. She will see me at her normal time in July for annual exam.

## 2011-06-06 ENCOUNTER — Telehealth: Payer: Self-pay | Admitting: *Deleted

## 2011-06-06 ENCOUNTER — Other Ambulatory Visit: Payer: Self-pay | Admitting: *Deleted

## 2011-06-06 DIAGNOSIS — N6310 Unspecified lump in the right breast, unspecified quadrant: Secondary | ICD-10-CM

## 2011-06-06 NOTE — Telephone Encounter (Signed)
Message copied by Libby Maw on Tue Jun 06, 2011  9:43 AM ------      Message from: Trellis Paganini      Created: Fri Jun 02, 2011  2:51 PM       Please schedule patient for diagnostic mammogram and ultrasound of right breast at the breast Center. She has a 1-1/2 cm new mass at 12:00 in her right breast.

## 2011-06-06 NOTE — Telephone Encounter (Signed)
Lm for patient to call.  appt set up with [facility] on 06/09/11 @ 1:40.

## 2011-06-06 NOTE — Telephone Encounter (Signed)
Patient was informed.

## 2011-06-09 ENCOUNTER — Other Ambulatory Visit: Payer: Self-pay | Admitting: Obstetrics and Gynecology

## 2011-06-09 ENCOUNTER — Ambulatory Visit
Admission: RE | Admit: 2011-06-09 | Discharge: 2011-06-09 | Disposition: A | Discharge status: Home / Self Care | Payer: Medicare Other | Source: Ambulatory Visit | Attending: Obstetrics and Gynecology | Admitting: Obstetrics and Gynecology

## 2011-06-09 DIAGNOSIS — N6310 Unspecified lump in the right breast, unspecified quadrant: Secondary | ICD-10-CM

## 2011-06-09 DIAGNOSIS — N63 Unspecified lump in unspecified breast: Secondary | ICD-10-CM | POA: Diagnosis not present

## 2011-06-13 ENCOUNTER — Other Ambulatory Visit: Payer: Medicare Other

## 2011-06-14 ENCOUNTER — Inpatient Hospital Stay: Admission: RE | Admit: 2011-06-14 | Payer: Medicare Other | Source: Ambulatory Visit

## 2011-06-15 ENCOUNTER — Other Ambulatory Visit: Payer: Medicare Other

## 2011-06-20 ENCOUNTER — Ambulatory Visit
Admission: RE | Admit: 2011-06-20 | Discharge: 2011-06-20 | Disposition: A | Discharge status: Home / Self Care | Payer: Medicare Other | Source: Ambulatory Visit | Attending: Obstetrics and Gynecology | Admitting: Obstetrics and Gynecology

## 2011-06-20 ENCOUNTER — Other Ambulatory Visit: Payer: Self-pay | Admitting: Obstetrics and Gynecology

## 2011-06-20 DIAGNOSIS — N63 Unspecified lump in unspecified breast: Secondary | ICD-10-CM | POA: Diagnosis not present

## 2011-06-20 DIAGNOSIS — M8580 Other specified disorders of bone density and structure, unspecified site: Secondary | ICD-10-CM

## 2011-06-20 DIAGNOSIS — N6310 Unspecified lump in the right breast, unspecified quadrant: Secondary | ICD-10-CM

## 2011-06-20 DIAGNOSIS — C50919 Malignant neoplasm of unspecified site of unspecified female breast: Secondary | ICD-10-CM | POA: Diagnosis not present

## 2011-06-20 HISTORY — PX: OTHER SURGICAL HISTORY: SHX169

## 2011-06-21 ENCOUNTER — Ambulatory Visit
Admission: RE | Admit: 2011-06-21 | Discharge: 2011-06-21 | Disposition: A | Discharge status: Home / Self Care | Payer: Medicare Other | Source: Ambulatory Visit | Attending: Obstetrics and Gynecology | Admitting: Obstetrics and Gynecology

## 2011-06-21 ENCOUNTER — Other Ambulatory Visit: Payer: Self-pay | Admitting: Obstetrics and Gynecology

## 2011-06-21 DIAGNOSIS — N6489 Other specified disorders of breast: Secondary | ICD-10-CM

## 2011-06-21 DIAGNOSIS — N63 Unspecified lump in unspecified breast: Secondary | ICD-10-CM | POA: Diagnosis not present

## 2011-06-21 DIAGNOSIS — C50919 Malignant neoplasm of unspecified site of unspecified female breast: Secondary | ICD-10-CM | POA: Diagnosis not present

## 2011-06-21 DIAGNOSIS — C50911 Malignant neoplasm of unspecified site of right female breast: Secondary | ICD-10-CM

## 2011-06-21 DIAGNOSIS — M8580 Other specified disorders of bone density and structure, unspecified site: Secondary | ICD-10-CM

## 2011-06-21 DIAGNOSIS — R928 Other abnormal and inconclusive findings on diagnostic imaging of breast: Secondary | ICD-10-CM | POA: Diagnosis not present

## 2011-06-22 ENCOUNTER — Other Ambulatory Visit: Payer: Self-pay | Admitting: *Deleted

## 2011-06-22 ENCOUNTER — Telehealth: Payer: Self-pay | Admitting: *Deleted

## 2011-06-22 DIAGNOSIS — C50419 Malignant neoplasm of upper-outer quadrant of unspecified female breast: Secondary | ICD-10-CM

## 2011-06-22 DIAGNOSIS — C50411 Malignant neoplasm of upper-outer quadrant of right female breast: Secondary | ICD-10-CM | POA: Insufficient documentation

## 2011-06-22 NOTE — Telephone Encounter (Signed)
Confirmed [facility] for 06/28/11 at 0800 .  Instructions and contact information given.  

## 2011-06-23 ENCOUNTER — Ambulatory Visit
Admission: RE | Admit: 2011-06-23 | Discharge: 2011-06-23 | Disposition: A | Discharge status: Home / Self Care | Payer: Medicare Other | Source: Ambulatory Visit | Attending: Obstetrics and Gynecology | Admitting: Obstetrics and Gynecology

## 2011-06-23 DIAGNOSIS — C50919 Malignant neoplasm of unspecified site of unspecified female breast: Secondary | ICD-10-CM | POA: Diagnosis not present

## 2011-06-23 DIAGNOSIS — C50911 Malignant neoplasm of unspecified site of right female breast: Secondary | ICD-10-CM

## 2011-06-23 DIAGNOSIS — N63 Unspecified lump in unspecified breast: Secondary | ICD-10-CM | POA: Diagnosis not present

## 2011-06-23 MED ORDER — GADOBENATE DIMEGLUMINE 529 MG/ML IV SOLN
17.0000 mL | Freq: Once | INTRAVENOUS | Status: AC | PRN
  Administered 2011-06-23: 17 mL via INTRAVENOUS

## 2011-06-28 ENCOUNTER — Other Ambulatory Visit (HOSPITAL_BASED_OUTPATIENT_CLINIC_OR_DEPARTMENT_OTHER): Payer: Medicare Other | Admitting: Lab

## 2011-06-28 ENCOUNTER — Encounter: Payer: Self-pay | Admitting: Radiation Oncology

## 2011-06-28 ENCOUNTER — Ambulatory Visit
Admission: RE | Admit: 2011-06-28 | Discharge: 2011-06-28 | Disposition: A | Discharge status: Home / Self Care | Payer: Medicare Other | Source: Ambulatory Visit | Attending: Radiation Oncology | Admitting: Radiation Oncology

## 2011-06-28 ENCOUNTER — Encounter: Payer: Self-pay | Admitting: Oncology

## 2011-06-28 ENCOUNTER — Encounter: Payer: Self-pay | Admitting: *Deleted

## 2011-06-28 ENCOUNTER — Ambulatory Visit (HOSPITAL_BASED_OUTPATIENT_CLINIC_OR_DEPARTMENT_OTHER): Payer: Medicare Other | Admitting: Oncology

## 2011-06-28 ENCOUNTER — Ambulatory Visit: Payer: Medicare Other

## 2011-06-28 ENCOUNTER — Encounter: Payer: Self-pay | Admitting: Specialist

## 2011-06-28 ENCOUNTER — Ambulatory Visit (HOSPITAL_BASED_OUTPATIENT_CLINIC_OR_DEPARTMENT_OTHER): Payer: Medicare Other | Admitting: General Surgery

## 2011-06-28 ENCOUNTER — Other Ambulatory Visit (INDEPENDENT_AMBULATORY_CARE_PROVIDER_SITE_OTHER): Payer: Self-pay | Admitting: General Surgery

## 2011-06-28 ENCOUNTER — Ambulatory Visit: Payer: Medicare Other | Attending: General Surgery | Admitting: Physical Therapy

## 2011-06-28 VITALS — BP 131/79 | HR 66 | Temp 98.7°F | Ht 61.5 in | Wt 187.4 lb

## 2011-06-28 DIAGNOSIS — C50919 Malignant neoplasm of unspecified site of unspecified female breast: Secondary | ICD-10-CM

## 2011-06-28 DIAGNOSIS — C50419 Malignant neoplasm of upper-outer quadrant of unspecified female breast: Secondary | ICD-10-CM

## 2011-06-28 DIAGNOSIS — Z170 Estrogen receptor positive status [ER+]: Secondary | ICD-10-CM | POA: Diagnosis not present

## 2011-06-28 DIAGNOSIS — M2569 Stiffness of other specified joint, not elsewhere classified: Secondary | ICD-10-CM | POA: Insufficient documentation

## 2011-06-28 DIAGNOSIS — IMO0001 Reserved for inherently not codable concepts without codable children: Secondary | ICD-10-CM | POA: Insufficient documentation

## 2011-06-28 DIAGNOSIS — R293 Abnormal posture: Secondary | ICD-10-CM | POA: Insufficient documentation

## 2011-06-28 LAB — CBC WITH DIFFERENTIAL/PLATELET
BASO%: 1.8 % (ref 0.0–2.0)
EOS%: 3.2 % (ref 0.0–7.0)
LYMPH%: 34.6 % (ref 14.0–49.7)
MCH: 30.9 pg (ref 25.1–34.0)
MCHC: 33.4 g/dL (ref 31.5–36.0)
MONO#: 0.5 10*3/uL (ref 0.1–0.9)
Platelets: 191 10*3/uL (ref 145–400)
RBC: 4.84 10*6/uL (ref 3.70–5.45)
WBC: 5.2 10*3/uL (ref 3.9–10.3)
lymph#: 1.8 10*3/uL (ref 0.9–3.3)
nRBC: 0 % (ref 0–0)

## 2011-06-28 LAB — COMPREHENSIVE METABOLIC PANEL
ALT: 43 U/L — ABNORMAL HIGH (ref 0–35)
AST: 33 U/L (ref 0–37)
Alkaline Phosphatase: 79 U/L (ref 39–117)
BUN: 16 mg/dL (ref 6–23)
Calcium: 9.5 mg/dL (ref 8.4–10.5)
Chloride: 102 mEq/L (ref 96–112)
Creatinine, Ser: 0.81 mg/dL (ref 0.50–1.10)

## 2011-06-28 LAB — CANCER ANTIGEN 27.29: CA 27.29: 12 U/mL (ref 0–39)

## 2011-06-28 NOTE — Progress Notes (Signed)
Mailed after appt letter to pt. 

## 2011-06-28 NOTE — Progress Notes (Signed)
I met with the patient and her husband in the [facility]. We reviewed the distress screening tool, which she rated at a "0".  She had a very positive attitude.  I told her about the Breast Cancer Support Group and [facility] Recovery and gave her a support program calendar.

## 2011-06-28 NOTE — Progress Notes (Signed)
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 ER 100% PR 100% Ki-67 50% HER-2/neu negative. T1cN0M0, stage I    HISTORY OF PRESENT ILLNESS:  Joy Gonzales is a 71 y.o. female who recently self palpated a lump in the 12:00 position of her right breast which prompted a mammogram. She noticed this around beginning of February. Mammography on 06/09/2011 demonstrated a mass corresponding to the area of self palpation. On that same day, an ultrasound demonstrated a 1.4 x 0.9 x 1.0 cm mass in the 12:00 position.   The patient underwent core needle biopsy and 06/20/2011. The pathology is notable for that described above. There was lymphovascular space invasion identified in the biopsy specimen. She underwent MRI of her breasts bilaterally and 06/23/2011. This demonstrated a 1.6 x 1.5 x 1.6 cm lesion in the 12:00 position of the right breast. There is no abnormal enhancement seen in the opposite breast. There is no enlarged axillary or internal mammary adenopathy. This is a solitary finding.  The patient has a history of bladder cancer which has been in remission since resection in 1999. She has multiple granulomas of the vocal cord that have been treated by multiple surgeries in [place] but no evidence of malignancy. The patient also had reports a history of vaginal spotting this year which was explored by [facility], workup negative for cancer. Her ECoG performance status, in my estimation is 1.  PAST MEDICAL HISTORY:  has a past medical history of HTN (hypertension); GERD (gastroesophageal reflux disease); Hyperlipemia; Elevated LFTs; Tinnitus; Flushing; Diverticulosis; Vocal cord granuloma; IBS (irritable bowel syndrome);  Rectocele; Lichen sclerosus; Atrophic vaginitis; HSV-1 infection; Osteopenia; Dyspnea; and Bladder cancer.     Gynecologic history:  the patient has an Estring ring which I have instructed her to stop using. She reports that she was on another form of hormone replacement therapy for about 10 years but she stopped this in year 2000. She has carried 2 children to term. Age of first live birth was 70. Age at first menstrual cycle was 10.   PAST SURGICAL HISTORY:  Past Surgical History  Procedure Date  . Tonsillectomy and adenoidectomy   . Throat surgery     LARYN. GRANULOMA  POLYP  . Colposcopy   . Cholecystectomy   . Bladder tumor excision   . Breast tumor removed   . Breast surgery   . Hysteroscopy w/d&c 05/18/2011    Procedure: DILATATION AND CURETTAGE /HYSTEROSCOPY;  Surgeon: Trellis Paganini, MD;  Location: [facility];  Service: Gynecology;  Laterality: N/A;  or Tuesday, March 12 7:30am? or Thursday, March 14 7:30am? or Tuesday March 19 7:30am?  . Dilation and curettage of uterus   . Hysteroscopy      FAMILY HISTORY: family history includes Barrett's esophagus in her brother; Breast cancer in her maternal grandmother; Cancer in her father and sister; Cancer (age of onset:60) in her maternal grandmother; Cancer (age of onset:70) in her maternal aunt; Coronary artery disease in her father; Diabetes in her sister; Heart failure in her father; Hypertension in her brother, father, and sisters; Kidney cancer in her sister; and Stroke in her sister.   SOCIAL HISTORY:  reports that she has never smoked. She  has never used smokeless tobacco. She reports that she drinks alcohol. She reports that she does not use illicit drugs.   ALLERGIES: Plendil   MEDICATIONS:  Current Outpatient Prescriptions  Medication Sig Dispense Refill  . ALPRAZolam (XANAX) 0.25 MG tablet Take 0.125 mg by mouth daily as needed. Take infrequently      . aspirin 81 MG tablet Take 81 mg by mouth daily.        Marland Kitchen  esomeprazole (NEXIUM) 40 MG capsule Take 40 mg by mouth daily.        Marland Kitchen estradiol (ESTRING) 2 MG vaginal ring Place 2 mg vaginally every 3 (three) months. follow package directions  1 each  4  . fenofibrate micronized (LOFIBRA) 200 MG capsule Take 200 mg by mouth daily.        . fexofenadine (ALLEGRA) 180 MG tablet Take 180 mg by mouth as needed.        . metoprolol succinate (TOPROL XL) 25 MG 24 hr tablet Take 25 mg by mouth daily.        Marland Kitchen olmesartan-hydrochlorothiazide (BENICAR HCT) 20-12.5 MG per tablet Take 1 tablet by mouth daily.  30 tablet  11  . ranitidine (ZANTAC) 75 MG tablet Take 75 mg by mouth at bedtime.       . valACYclovir (VALTREX) 500 MG tablet Take 500 mg by mouth 2 (two) times daily. When she has a fever blister only         REVIEW OF SYSTEMS:  A 14 point comprehensive review of systems was obtained and reviewed with the patient. It is notable for that above    PHYSICAL EXAM:  vitals were not taken for this visit.  General: Alert and oriented, in no acute distress HEENT: Head is normocephalic. Pupils are equally round and reactive to light. Extraocular movements are intact. Oropharynx is clear. Neck: Neck is supple, no palpable cervical or supraclavicular lymphadenopathy. Heart: Regular in rate and rhythm with no murmurs, rubs, or gallops. Chest: Clear to auscultation bilaterally, with no rhonchi, wheezes, or rales. Abdomen: Soft, nontender, nondistended, with no rigidity or guarding. Extremities: No cyanosis or edema. Lymphatics: No concerning lymphadenopathy. Skin: No concerning lesions. Musculoskeletal: symmetric strength and muscle tone throughout. Neurologic: Cranial nerves II through XII are grossly intact. No obvious focalities. Speech is fluent. Coordination is intact. Psychiatric: Judgment and insight are intact. Affect is appropriate. Breasts: Notable for a palpable hematoma in the upper outer quadrant of the right breast, corresponding with her biopsy site.  There are no palpable axillary or supraclavicular lymph nodes. No palpable lesions of concern in the left breast.    LABORATORY DATA:  Lab Results  Component Value Date   WBC 5.2 06/28/2011   HGB 14.9 06/28/2011   HCT 44.7 06/28/2011   MCV 92.3 06/28/2011   PLT 191 06/28/2011    PATHOLOGY: As above   RADIOLOGY: As above    IMPRESSION/PLAN: Is a very pleasant 70 year old woman, who is quite functional, with T1 CN 0M0 ER/PR positive HER-2/neu negative breast cancer. I social patient a mastectomy versus breast conservation. She is very interested in breast conservation. She plans to undergo lumpectomy and sentinel lymph node biopsy under the care of Dr. Johna Sheriff. I did acknowledge the data from the Dannial Monarch al study published in the [place] Journal of Medicine which demonstrates a small benefit in terms of local control from radiotherapy for elderly women with hormone receptor positive breast cancer. It did not show a benefit in terms of overall survival. I  think that since the patient is a relatively young 71 year old and could live for at least a couple more decades, she is more likely to benefit from radiotherapy than the average patient that enrolled on the study. She would like to be relatively aggressive in her care and is enthusiastic about adjuvant treatment.  As for systemic therapy, she may undergo Oncotype DX to determine the potential benefit of this.  It was a pleasure meeting the patient today. We discussed the risks, benefits, and side effects of radiotherapy. We discussed that radiation would take approximately 5-6 weeks to complete, possibly shorter if her anatomy appears amenable to hypofractionation as used in the trials published from [place] and [place].  We spoke about acute effects including skin irritation and fatigue as well as much less common late effects including lung irritation. We spoke about the latest technology that is used to minimize the risk of late effects  for breast cancer patients undergoing radiotherapy. No guarantees of treatment were given. The patient is enthusiastic about proceeding with treatment. I look forward to participating in the patient's care.

## 2011-06-28 NOTE — Patient Instructions (Signed)
1. I will see you back in 5 weeks time  2. Oncotype Dx ordered

## 2011-06-28 NOTE — Progress Notes (Signed)
Patient came in today as a new patient with her husband she has two insurance.I did explain to her our financial assistance program and co-pay assistance,but she said that she was [place] with her two insurance.

## 2011-06-28 NOTE — Progress Notes (Signed)
Subjective:   new diagnosis right breast cancer  Patient ID: Joy Gonzales, female   DOB: [birth year], 71 y.o.   MRN: [id number]  HPI Patient is a very pleasant 71 year old female referred by Dr. Judyann Munson from the breast imaging Center for a new diagnosis of invasive cancer of the right breast. The patient has had yearly mammograms for many years. She had a normal mammogram in July of last year. She recently felt a lump in the upper aspect of her right breast. She saw Dr. Oletha Blend and this was confirmed.subsequent diagnostic mammogram revealed only scattered densities but ultrasound confirmed a 1.4 cm hypoechoic mass in the area of the palpable abnormality. Subsequent large core needle biopsy was performed. This has revealed invasive mammary carcinoma grade 2-3, ER PR positive and HER-2/neu negative. Lymphovascular invasion was noted. Subsequent MRI has confirmed a 1.6 cm mass with no other abnormalities and no evidence of lymphadenopathy. The patient has not noted any other lumps in either breast. No nipple discharge or skin changes or unusual pain or other worrisome symptoms.  Past Medical History  Diagnosis Date  . HTN (hypertension)   . GERD (gastroesophageal reflux disease)   . Hyperlipemia   . Elevated LFTs   . Tinnitus   . Flushing   . Diverticulosis   . Vocal cord granuloma   . IBS (irritable bowel syndrome)   . Rectocele   . Lichen sclerosus   . Atrophic vaginitis   . HSV-1 infection   . Osteopenia   . Dyspnea     on exertion  . Bladder cancer    Past Surgical History  Procedure Date  . Tonsillectomy and adenoidectomy   . Throat surgery     LARYN. GRANULOMA  POLYP  . Colposcopy   . Cholecystectomy   . Bladder tumor excision   . Breast tumor removed   . Breast surgery   . Hysteroscopy w/d&c 05/18/2011    Procedure: DILATATION AND CURETTAGE /HYSTEROSCOPY;  Surgeon: Trellis Paganini, MD;  Location: [facility];  Service: Gynecology;  Laterality: N/A;  or Tuesday, March 12 7:30am? or  Thursday, March 14 7:30am? or Tuesday March 19 7:30am?  . Dilation and curettage of uterus   . Hysteroscopy    Current Outpatient Prescriptions  Medication Sig Dispense Refill  . ALPRAZolam (XANAX) 0.25 MG tablet Take 0.125 mg by mouth daily as needed. Take infrequently      . aspirin 81 MG tablet Take 81 mg by mouth daily.        Marland Kitchen esomeprazole (NEXIUM) 40 MG capsule Take 40 mg by mouth daily.        Marland Kitchen estradiol (ESTRING) 2 MG vaginal ring Place 2 mg vaginally every 3 (three) months. follow package directions  1 each  4  . fenofibrate micronized (LOFIBRA) 200 MG capsule Take 200 mg by mouth daily.        . fexofenadine (ALLEGRA) 180 MG tablet Take 180 mg by mouth as needed.        . metoprolol succinate (TOPROL XL) 25 MG 24 hr tablet Take 25 mg by mouth daily.        Marland Kitchen olmesartan-hydrochlorothiazide (BENICAR HCT) 20-12.5 MG per tablet Take 1 tablet by mouth daily.  30 tablet  11  . ranitidine (ZANTAC) 75 MG tablet Take 75 mg by mouth at bedtime.       . valACYclovir (VALTREX) 500 MG tablet Take 500 mg by mouth 2 (two) times daily. When she has a fever blister only  Allergies  Allergen Reactions  . Plendil Palpitations    tachycardia   History  Substance Use Topics  . Smoking status: Never Smoker   . Smokeless tobacco: Never Used  . Alcohol Use: 0.0 oz/week     very rare     Review of Systems  Constitutional: Negative.   HENT: Positive for sore throat and voice change.   Respiratory: Negative.   Cardiovascular: Negative.   Gastrointestinal: Positive for diarrhea and constipation.       Colonoscopy is up to date  Genitourinary: Positive for urgency.  Musculoskeletal: Negative.   Neurological: Negative.   Psychiatric/Behavioral: Negative.        Objective:   Physical Exam General: Alert, well-developed Caucasian female, in no distress Skin: Warm and dry without rash or infection. HEENT: No palpable masses or thyromegaly. Sclera nonicteric. Pupils equal round and  reactive. Oropharynx clear. Lymph nodes: No cervical, supraclavicular, or inguinal nodes palpable. Breasts: There is some bruising and a small palpable mass about 4 or 5 cm above the right nipple at the 12:00 position. Well-healed incision periareolar on the right. I cannot feel any other masses in either breast. Lungs: Breath sounds clear and equal without increased work of breathing Cardiovascular: Regular rate and rhythm without murmur. No JVD or edema. Peripheral pulses intact. Abdomen: Nondistended. Soft and nontender. No masses palpable. No organomegaly. No palpable hernias. Extremities: No edema or joint swelling or deformity. No chronic venous stasis changes. Neurologic: Alert and fully oriented. Gait normal.    Assessment:     New diagnosis clinical stage TI C. 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 We had an extensive discussion regarding the initial surgical treatment options. She would certainly be a candidate for breast conservation which is what she would prefer. We discussed the nature of the surgery and risks of bleeding, infection, slight risk of lymphedema, subsequent adjuvant treatment, and possibility of further surgery based on final pathology findings. I recommended needle localization with right breast lumpectomy and axillary sentinel lymph node biopsy. All her questions were answered.    Plan:     Needle localized right breast lumpectomy with axillary sentinel lymph node biopsy as an outpatient.

## 2011-06-28 NOTE — Progress Notes (Signed)
Joy Gonzales [id number] 1940/10/12 71 y.o. 06/28/2011 11:59 AM  CC  Garlan Fillers, MD, MD [address] [facility], [place]. [place] [address] Dr. Glenna Fellows Dr. 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  STAGE:  Cancer of upper-outer quadrant of female breast   Primary site: Breast (Right)   Staging method: AJCC 7th Edition   Clinical: Stage IA (T1c, N0, cM0)   Summary: Stage IA (T1c, N0, cM0)  REFERRING PHYSICIAN: Dr. Sharlet Salina Hoxworth  HISTORY OF PRESENT ILLNESS:  Joy Gonzales is a 71 y.o. female.  Without significant past medical history except for hypertension and gastroesophageal reflux disease. Patient recently on self breast examination found a right breast mass in the 12:00 position. She went on to have a mammogram performed on 06/09/2011 that showed a lobulated mass in the central upper right breast. Ultrasound showed hypoechoic irregular mass measuring 1.4 x 0.9 x 1.0 cm there were also noted to be right axillary lymph nodes that were normal in appearance. Patient had a needle core biopsy performed on 06/20/2011 that revealed a invasive ductal carcinoma grade 2 or 3. The tumor was estrogen receptor +100% progesterone receptor +100% HER-2/neu negative proliferation marker was 50% there was noted to be lymphovascular invasion. She went on to have MRI of the breasts performed that revealed at the 12:00 position 1.6 x 1.5 x 1.6 cm mass. She is now seen in the multidisciplinary breast clinic for discussion of treatment options. Her case was discussed at the multidisciplinary breast conference this morning. She is without any complaints.   Past Medical History: Past Medical History  Diagnosis Date  . HTN (hypertension)   . GERD (gastroesophageal reflux disease)     . Hyperlipemia   . Elevated LFTs   . Tinnitus   . Flushing   . Diverticulosis   . Vocal cord granuloma   . IBS (irritable bowel syndrome)   . Rectocele   . Lichen sclerosus   . Atrophic vaginitis   . HSV-1 infection   . Osteopenia   . Dyspnea     on exertion  . Bladder cancer     Past Surgical History: Past Surgical History  Procedure Date  . Tonsillectomy and adenoidectomy   . Throat surgery     LARYN. GRANULOMA  POLYP  . Colposcopy   . Cholecystectomy   . Bladder tumor excision   . Breast tumor removed   . Breast surgery   . Hysteroscopy w/d&c 05/18/2011    Procedure: DILATATION AND CURETTAGE /HYSTEROSCOPY;  Surgeon: Trellis Paganini, MD;  Location: [facility];  Service: Gynecology;  Laterality: N/A;  or Tuesday, March 12 7:30am? or Thursday, March 14 7:30am? or Tuesday March 19 7:30am?  . Dilation and curettage of uterus   . Hysteroscopy     Family History: Family History  Problem Relation Age of Onset  . Cancer Father     ESOPHAGEAL  . Coronary artery disease Father   . Hypertension Father   . Heart failure Father   . Barrett's esophagus Brother   . Hypertension Brother   . Kidney cancer Sister   . Hypertension Sister   . Diabetes Sister   . Cancer Sister     kidney  . Hypertension Sister   . Stroke Sister   . Breast cancer Maternal Grandmother   . Cancer Maternal  Grandmother 60    breast cancer  . Cancer Maternal Aunt 42    breast cancer    Social History History  Substance Use Topics  . Smoking status: Never Smoker   . Smokeless tobacco: Never Used  . Alcohol Use: 0.0 oz/week     very rare    Allergies: Allergies  Allergen Reactions  . Plendil Palpitations    tachycardia    Current Medications: Current Outpatient Prescriptions  Medication Sig Dispense Refill  . ALPRAZolam (XANAX) 0.25 MG tablet Take 0.125 mg by mouth daily as needed. Take infrequently      . aspirin 81 MG tablet Take 81 mg by mouth daily.        Marland Kitchen esomeprazole  (NEXIUM) 40 MG capsule Take 40 mg by mouth daily.        Marland Kitchen estradiol (ESTRING) 2 MG vaginal ring Place 2 mg vaginally every 3 (three) months. follow package directions  1 each  4  . fenofibrate micronized (LOFIBRA) 200 MG capsule Take 200 mg by mouth daily.        . fexofenadine (ALLEGRA) 180 MG tablet Take 180 mg by mouth as needed.        . metoprolol succinate (TOPROL XL) 25 MG 24 hr tablet Take 25 mg by mouth daily.        Marland Kitchen olmesartan-hydrochlorothiazide (BENICAR HCT) 20-12.5 MG per tablet Take 1 tablet by mouth daily.  30 tablet  11  . ranitidine (ZANTAC) 75 MG tablet Take 75 mg by mouth at bedtime.       . valACYclovir (VALTREX) 500 MG tablet Take 500 mg by mouth 2 (two) times daily. When she has a fever blister only        OB/GYN History: Menarche at age 27 patient has been on hormone replacement therapy for 10 years she used using Estring now and she will discontinue this. She's had 2 term pregnancies first live birth was at 42. She has had a colonoscopy in 2010 bone density 2006 last Pap smear was in 2012.   Prior History of Cancer: Patient previously was diagnosed with bladder cancer she was treated here at [facility] in 1999. She's also had skin cancer on the nose in 2009 appear  ECOG PERFORMANCE STATUS: 0 - Asymptomatic  Genetic Counseling/testing: A complete family history was reviewed the patient's maternal grandmother had breast cancer in her 41s maternal is cancer in 57s father esophageal cancer at 78 sister with kidney cancer in their 89s. Most cancer are late onset. We will discuss possibility of genetic counseling.  REVIEW OF SYSTEMS:  A comprehensive review of systems was negative.  PHYSICAL EXAMINATION: Blood pressure 131/79, pulse 66, temperature 98.7 F (37.1 C), height 5' 1.5" (1.562 m), weight 187 lb 6.4 oz (85.004 kg), last menstrual period 03/13/1990.  ZOX:WRUEA, healthy, no distress, well nourished and well developed SKIN: skin color, texture, turgor  are normal HEAD: Normocephalic EYES: PERRLA, EOMI, Conjunctiva are pink and non-injected, sclera clear EARS: External ears normal OROPHARYNX:no exudate and no erythema  NECK: supple, no adenopathy, thyroid normal size, non-tender, without nodularity LYMPH:  no palpable lymphadenopathy, no hepatosplenomegaly BREAST:abnormal mass palpable in right breast LUNGS: clear to auscultation and percussion HEART: regular rate & rhythm, no murmurs and no gallops ABDOMEN:abdomen soft, non-tender, normal bowel sounds and no masses or organomegaly BACK: Back symmetric, no curvature., No CVA tenderness EXTREMITIES:no edema, no clubbing, no cyanosis  NEURO: alert & oriented x 3 with fluent speech, no focal motor/sensory deficits, gait normal,  reflexes normal and symmetric   STUDIES/RESULTS: US Breast Right  06/09/2011  *RADIOLOGY REPORT*  Clinical Data:  Palpable mass 12 o'clock position right breast. She first noticed at 2 months ago believe that it has enlarged over that time.  DIGITAL DIAGNOSTIC RIGHT MAMMOGRAM WITH CAD AND RIGHT BREAST ULTRASOUND:  Comparison:  With priors  Findings:  There are scattered fibroglandular densities. A lobulated mass in the upper central right breast corresponds to the palpable area of concern. No suspicious microcalcifications are identified in the right breast.  No distortion is seen.  Mammographic images were processed with CAD.  On physical exam, I palpate an approximately 1 cm mass at 12 o'clock position 5 cm from the nipple.  Ultrasound is performed, showing a hypoechoic heterogeneous irregular mass that measures 1.4 x 0.9 x 1.0 cm.  No internal vascular flow is seen on color Doppler imaging. There is some posterior acoustic enhancement.  Ultrasound of the right axilla shows several normal lymph nodes. No lymphadenopathy is identified.  IMPRESSION: 1.4 cm mass 12 o'clock position right breast corresponds to the palpable area of concern.  Malignancy cannot be excluded.  Ultrasound-guided core needle biopsy is recommended and has been scheduled for Tuesday June 13, 2011.  BI-RADS CATEGORY 4:  Suspicious abnormality - biopsy should be considered.  Original Report Authenticated By: Britta Mccreedy, M.D.   Mr Breast Bilateral W Wo Contrast  06/23/2011  *RADIOLOGY REPORT*  Clinical Data: Newly diagnosed invasive mammary carcinoma in the 12 o'clock region of the right breast.  BILATERAL BREAST MRI WITH AND WITHOUT CONTRAST  Technique: Multiplanar, multisequence MR images of both breasts were obtained prior to and following the intravenous administration of 17ml of multihance.  Three dimensional images were evaluated at the independent DynaCad workstation.  Comparison:  Mammograms dated 06/21/2011, 06/20/2011, and 09/27/2010.  Findings: There is a moderate background parenchymal enhancement pattern.  In the 12 o'clock region of the right breast there is an irregular enhancing mass measuring 1.6 x 1.5 x 1.6 cm.  It is associated with a biopsy clip artifact.  It corresponds well with the known malignancy.  No abnormal enhancement is seen in the left breast.  There is no enlarged axillary or internal mammary adenopathy.  IMPRESSION: Solitary enhancing 1.6 cm mass in the 12 o'clock region of the right breast corresponding well with the known malignancy. Treatment planning is recommended.  THREE-DIMENSIONAL MR IMAGE RENDERING ON INDEPENDENT WORKSTATION:  Three-dimensional MR images were rendered by post-processing of the original MR data on an independent workstation.  The three- dimensional MR images were interpreted, and findings were reported in the accompanying complete MRI report for this study.  BI-RADS CATEGORY 6:  Known biopsy-proven malignancy - appropriate action should be taken.  Original Report Authenticated By: Littie Deeds. Judyann Munson, M.D.   [place] Core Biopsy  06/20/2011  *RADIOLOGY REPORT*  Clinical Data:  Suspicious right breast mass 12 o'clock location  ULTRASOUND GUIDED VACUUM ASSISTED  CORE BIOPSY OF THE RIGHT BREAST  Comparison: Prior exams  I met with the patient and we discussed the procedure of ultrasound- guided biopsy, including benefits and alternatives.  We discussed the high likelihood of a successful procedure. We discussed the risks of the procedure, including infection, bleeding, tissue injury, clip migration, and inadequate sampling.  Informed, written consent was given. Increased risk of bleeding due to use of 81 mg aspirin per day was specifically discussed.  Using sterile technique, 2% lidocaine, ultrasound guidance, and a 12 gauge vacuum assisted needle, biopsy was performed of right  breast mass 12 o'clock location using a lateral to medial approach. Two core samples were obtained.  At the conclusion of the procedure, a tissue marker clip was deployed into the biopsy cavity.  Follow-up 2-view mammogram was performed and dictated separately.  IMPRESSION: Ultrasound-guided biopsy of right breast mass, 12 o'clock location, with ribbon-shaped clip placement.  Pathology is pending.  No apparent complications.  Original Report Authenticated By: Harrel Lemon, M.D.   Mm Digital Diagnostic Unilat L  06/21/2011  *RADIOLOGY REPORT*  Clinical Data:  Newly-diagnosed right breast cancer.  Pre MRI exam.  DIGITAL DIAGNOSTIC LEFT MAMMOGRAM WITH CAD  Mammographic images were processed with CAD.  Comparison:  Prior exams  Findings:  The breast parenchyma demonstrates scattered fibroglandular densities.  No suspicious mass, calcification, or architectural distortion is seen.  Areas of nodular breast parenchyma are stable as are benign appearing calcifications most typical for involuting fibroadenomata.  IMPRESSION: No evidence for malignancy in the left breast.  Findings and recommendations discussed with the patient and provided in written form at the time of the exam.  BI-RADS CATEGORY 1:  Negative.  Recommendation:  Treatment plan  Original Report Authenticated By: Harrel Lemon, M.D.     Mm Digital Diagnostic Unilat R  06/20/2011  *RADIOLOGY REPORT*  Clinical Data:  Ultrasound-guided right breast biopsy  DIGITAL DIAGNOSTIC RIGHT MAMMOGRAM  Comparison:  Prior exams  Findings:  Films are performed following ultrasound guided biopsy of right breast mass 12 o'clock location.  A ribbon shaped clip is appropriately located at the mammographically evident palpable mass that was biopsied.  IMPRESSION: Appropriate ribbon shaped clip location at the site of the biopsied mass in right breast 12 o'clock location.  Original Report Authenticated By: Harrel Lemon, M.D.   Mm Digital Diagnostic Unilat R  06/09/2011  *RADIOLOGY REPORT*  Clinical Data:  Palpable mass 12 o'clock position right breast. She first noticed at 2 months ago believe that it has enlarged over that time.  DIGITAL DIAGNOSTIC RIGHT MAMMOGRAM WITH CAD AND RIGHT BREAST ULTRASOUND:  Comparison:  With priors  Findings:  There are scattered fibroglandular densities. A lobulated mass in the upper central right breast corresponds to the palpable area of concern. No suspicious microcalcifications are identified in the right breast.  No distortion is seen.  Mammographic images were processed with CAD.  On physical exam, I palpate an approximately 1 cm mass at 12 o'clock position 5 cm from the nipple.  Ultrasound is performed, showing a hypoechoic heterogeneous irregular mass that measures 1.4 x 0.9 x 1.0 cm.  No internal vascular flow is seen on color Doppler imaging. There is some posterior acoustic enhancement.  Ultrasound of the right axilla shows several normal lymph nodes. No lymphadenopathy is identified.  IMPRESSION: 1.4 cm mass 12 o'clock position right breast corresponds to the palpable area of concern.  Malignancy cannot be excluded. Ultrasound-guided core needle biopsy is recommended and has been scheduled for Tuesday June 13, 2011.  BI-RADS CATEGORY 4:  Suspicious abnormality - biopsy should be considered.  Original Report  Authenticated By: Britta Mccreedy, M.D.   Mm Radiologist Eval And Mgmt  06/21/2011  *RADIOLOGY REPORT*  ESTABLISHED PATIENT OFFICE VISIT - LEVEL II [phone])  Chief Complaint:  The patient presented with a questioned palpable finding in the right breast 12 o'clock location.  Right diagnostic mammogram and ultrasound were performed.  History:  Imaging demonstrated an abnormal mass in the right breast 12 o'clock location.  Percutaneous core needle biopsy was performed 06/20/2011.  Exam:  The incision site in the right breast 12 o'clock location is clean and dry without ecchymosis or hematoma.  Assessment and Plan:  Pathology demonstrates invasive mammary carcinoma, possibly lobular subtype.  This is concordant with the imaging appearance.  MRI is scheduled for 12/13 at 9:30 a.m. Multidisciplinary clinic is scheduled 06/28/2011.  All questions were answered.  Left diagnostic mammogram is performed today for pre MRI purposes.  This will be dictated separately.  Original Report Authenticated By: Harrel Lemon, M.D.     LABS:    Chemistry      Component Value Date/Time   NA 137 06/28/2011 0809   K 3.7 06/28/2011 0809   CL 102 06/28/2011 0809   CO2 26 06/28/2011 0809   BUN 16 06/28/2011 0809   CREATININE 0.81 06/28/2011 0809      Component Value Date/Time   CALCIUM 9.5 06/28/2011 0809   ALKPHOS 79 06/28/2011 0809   AST 33 06/28/2011 0809   ALT 43* 06/28/2011 0809   BILITOT 0.4 06/28/2011 0809      Lab Results  Component Value Date   WBC 5.2 06/28/2011   HGB 14.9 06/28/2011   HCT 44.7 06/28/2011   MCV 92.3 06/28/2011   PLT 191 06/28/2011       PATHOLOGY: REASON FOR ADDENDUM, AMENDMENT OR CORRECTION: [id number]: Addendum for E-Cadherin stain. kh 06/21/11 04:35:43 PM ADDITIONAL INFORMATION: CHROMOGENIC IN-SITU HYBRIDIZATION Interpretation HER-2/NEU BY CISH - NO AMPLIFICATION OF HER-2 DETECTED. THE RATIO OF HER-2: CEP 17 SIGNALS WAS 1.21. Reference range: Ratio: HER2:CEP17 < 1.8 - gene  amplification not observed Ratio: HER2:CEP 17 1.8-2.2 - equivocal result Ratio: HER2:CEP17 > 2.2 - gene amplification observed Abigail Miyamoto MD Pathologist, Electronic Signature ( Signed 06/22/2011) PROGNOSTIC INDICATORS - ACIS Results IMMUNOHISTOCHEMICAL AND MORPHOMETRIC ANALYSIS BY THE AUTOMATED CELLULAR IMAGING SYSTEM (ACIS) This invasive carcinoma shows the following breast prognostic profile. Estrogen Receptor (Negative, <1%): 100%,POSITIVE, STRONG STAINING INTENSITY Progesterone Receptor (Negative, <1%): 100%,POSITIVE, STRONG STAINING INTENSITY Proliferation Marker Ki67 by M IB-1 (Low<20%): 50% All controls stained appropriately Abigail Miyamoto MD Pathologist, Electronic Signature ( Signed 06/22/2011) 1 of 3 Amended copy Addendum FINAL for [place], Unknown C [phone]) FINAL DIAGNOSIS Diagnosis Breast, right, needle core biopsy, 12 o'clock, 5FN - INVASIVE MAMMARY CARCINOMA, SEE COMMENT. - LYMPHOVASCULAR INVASION IDENTIFIED. Microscopic Comment E-Cadherin stain is pending and will be reported in an addendum. Although the grade of tumor is best assessed at resection, with these biopsies, the tumor is Grade 2-3. Breast prognostic studies are pending and will be reported in an addendum. The case is reviewed with Dr. Raynald Blend who concurs. (CRR:gt, 06/21/11) ADDENDUM: The carcinoma demonstrates strong diffuse E-Cadherin expression, confirming the ductal origin. (CR:kh 06-21-11)  ASSESSMENT    71 year old female with:  1. 1.6 cm Invasive ductal carcinoma of the right breast, tumor was ER+/PR+, Her2Neu-, Ki-67 50, with LVI. Patient seen in multidisplinary breast clinic. Her case was discussed at the [facility] conference.  2. Family history of malignancies    PLAN:    1. Lumpectomy with SNL  2. Oncotype Dx to decide if patient may need adjuvant chemotherapy post lumpectomy   3. Possibility of genetic testing.  4. Radiation post lumpectomy         Thank you so much for  allowing me to participate in the care of Joy Gonzales. I will continue to follow up the patient with you and assist in her care.  All questions were answered. The patient knows to call the clinic with any problems, questions  or concerns. We can certainly see the patient much sooner if necessary.  I spent 60 minutes counseling the patient face to face. The total time spent in the appointment was 60 minutes.  Drue Second, MD Medical/Oncology [facility] [phone] (beeper) [phone] (Office)  06/28/2011, 11:59 AM 06/28/2011, 11:59 AM

## 2011-06-30 ENCOUNTER — Encounter (HOSPITAL_BASED_OUTPATIENT_CLINIC_OR_DEPARTMENT_OTHER): Payer: Self-pay | Admitting: *Deleted

## 2011-06-30 NOTE — Progress Notes (Signed)
To come in for labs  

## 2011-07-03 ENCOUNTER — Encounter (HOSPITAL_BASED_OUTPATIENT_CLINIC_OR_DEPARTMENT_OTHER)
Admission: RE | Admit: 2011-07-03 | Discharge: 2011-07-03 | Disposition: A | Discharge status: Home / Self Care | Payer: Medicare Other | Source: Ambulatory Visit | Attending: General Surgery | Admitting: General Surgery

## 2011-07-03 ENCOUNTER — Encounter: Payer: Self-pay | Admitting: *Deleted

## 2011-07-03 ENCOUNTER — Telehealth: Payer: Self-pay | Admitting: *Deleted

## 2011-07-03 DIAGNOSIS — J701 Chronic and other pulmonary manifestations due to radiation: Secondary | ICD-10-CM | POA: Diagnosis not present

## 2011-07-03 DIAGNOSIS — I10 Essential (primary) hypertension: Secondary | ICD-10-CM | POA: Diagnosis not present

## 2011-07-03 DIAGNOSIS — Z01812 Encounter for preprocedural laboratory examination: Secondary | ICD-10-CM | POA: Diagnosis not present

## 2011-07-03 DIAGNOSIS — C50919 Malignant neoplasm of unspecified site of unspecified female breast: Secondary | ICD-10-CM | POA: Diagnosis not present

## 2011-07-03 LAB — BASIC METABOLIC PANEL
CO2: 26 mEq/L (ref 19–32)
Chloride: 103 mEq/L (ref 96–112)
Potassium: 4 mEq/L (ref 3.5–5.1)
Sodium: 140 mEq/L (ref 135–145)

## 2011-07-03 NOTE — Telephone Encounter (Signed)
Left vm for pt to return call regarding [facility] from 06/28/11.

## 2011-07-05 ENCOUNTER — Encounter (HOSPITAL_BASED_OUTPATIENT_CLINIC_OR_DEPARTMENT_OTHER): Payer: Self-pay | Admitting: Anesthesiology

## 2011-07-05 ENCOUNTER — Encounter (HOSPITAL_BASED_OUTPATIENT_CLINIC_OR_DEPARTMENT_OTHER)
Admission: RE | Disposition: A | Discharge status: Home / Self Care | Payer: Self-pay | Source: Ambulatory Visit | Attending: General Surgery

## 2011-07-05 ENCOUNTER — Ambulatory Visit
Admission: RE | Admit: 2011-07-05 | Discharge: 2011-07-05 | Disposition: A | Discharge status: Home / Self Care | Payer: Medicare Other | Source: Ambulatory Visit | Attending: General Surgery | Admitting: General Surgery

## 2011-07-05 ENCOUNTER — Other Ambulatory Visit (HOSPITAL_COMMUNITY): Payer: Medicare Other

## 2011-07-05 ENCOUNTER — Ambulatory Visit (HOSPITAL_BASED_OUTPATIENT_CLINIC_OR_DEPARTMENT_OTHER): Payer: Medicare Other | Admitting: Anesthesiology

## 2011-07-05 ENCOUNTER — Ambulatory Visit (HOSPITAL_BASED_OUTPATIENT_CLINIC_OR_DEPARTMENT_OTHER)
Admission: RE | Admit: 2011-07-05 | Discharge: 2011-07-05 | Disposition: A | Discharge status: Home / Self Care | Payer: Medicare Other | Source: Ambulatory Visit | Attending: General Surgery | Admitting: General Surgery

## 2011-07-05 ENCOUNTER — Ambulatory Visit (HOSPITAL_COMMUNITY)
Admission: RE | Admit: 2011-07-05 | Discharge: 2011-07-05 | Disposition: A | Discharge status: Home / Self Care | Payer: Medicare Other | Source: Ambulatory Visit | Attending: General Surgery | Admitting: General Surgery

## 2011-07-05 ENCOUNTER — Encounter (HOSPITAL_BASED_OUTPATIENT_CLINIC_OR_DEPARTMENT_OTHER): Payer: Self-pay

## 2011-07-05 DIAGNOSIS — J701 Chronic and other pulmonary manifestations due to radiation: Secondary | ICD-10-CM | POA: Diagnosis not present

## 2011-07-05 DIAGNOSIS — C50919 Malignant neoplasm of unspecified site of unspecified female breast: Secondary | ICD-10-CM | POA: Insufficient documentation

## 2011-07-05 DIAGNOSIS — N63 Unspecified lump in unspecified breast: Secondary | ICD-10-CM | POA: Diagnosis not present

## 2011-07-05 DIAGNOSIS — I10 Essential (primary) hypertension: Secondary | ICD-10-CM | POA: Diagnosis not present

## 2011-07-05 DIAGNOSIS — Z853 Personal history of malignant neoplasm of breast: Secondary | ICD-10-CM | POA: Diagnosis not present

## 2011-07-05 DIAGNOSIS — C50419 Malignant neoplasm of upper-outer quadrant of unspecified female breast: Secondary | ICD-10-CM

## 2011-07-05 DIAGNOSIS — Z01812 Encounter for preprocedural laboratory examination: Secondary | ICD-10-CM | POA: Insufficient documentation

## 2011-07-05 DIAGNOSIS — D0590 Unspecified type of carcinoma in situ of unspecified breast: Secondary | ICD-10-CM | POA: Diagnosis not present

## 2011-07-05 HISTORY — PX: BREAST LUMPECTOMY: SHX2

## 2011-07-05 HISTORY — DX: Other specified postprocedural states: Z98.890

## 2011-07-05 HISTORY — DX: Other specified postprocedural states: R11.2

## 2011-07-05 SURGERY — BREAST LUMPECTOMY WITH NEEDLE LOCALIZATION AND AXILLARY SENTINEL LYMPH NODE BX
Anesthesia: General | Site: Breast | Laterality: Right | Wound class: Clean

## 2011-07-05 MED ORDER — SCOPOLAMINE 1 MG/3DAYS TD PT72
1.0000 | MEDICATED_PATCH | Freq: Once | TRANSDERMAL | Status: DC
  Administered 2011-07-05: 1.5 mg via TRANSDERMAL

## 2011-07-05 MED ORDER — HYDROCODONE-ACETAMINOPHEN 5-325 MG PO TABS: 1.0000 | ORAL_TABLET | ORAL | Status: DC | PRN

## 2011-07-05 MED ORDER — PROPOFOL 10 MG/ML IV EMUL
INTRAVENOUS | Status: DC | PRN
  Administered 2011-07-05: 200 mg via INTRAVENOUS

## 2011-07-05 MED ORDER — ONDANSETRON HCL 4 MG/2ML IJ SOLN: 4.0000 mg | Freq: Once | INTRAMUSCULAR | Status: DC | PRN

## 2011-07-05 MED ORDER — BUPIVACAINE-EPINEPHRINE 0.25% -1:200000 IJ SOLN
INTRAMUSCULAR | Status: DC | PRN
  Administered 2011-07-05: 30 mL

## 2011-07-05 MED ORDER — ACETAMINOPHEN 10 MG/ML IV SOLN
1000.0000 mg | Freq: Once | INTRAVENOUS | Status: AC
  Administered 2011-07-05: 1000 mg via INTRAVENOUS

## 2011-07-05 MED ORDER — METOCLOPRAMIDE HCL 5 MG/ML IJ SOLN
INTRAMUSCULAR | Status: DC | PRN
  Administered 2011-07-05: 10 mg via INTRAVENOUS

## 2011-07-05 MED ORDER — DEXAMETHASONE SODIUM PHOSPHATE 4 MG/ML IJ SOLN
INTRAMUSCULAR | Status: DC | PRN
  Administered 2011-07-05: 4 mg via INTRAVENOUS

## 2011-07-05 MED ORDER — LACTATED RINGERS IV SOLN: INTRAVENOUS | Status: DC

## 2011-07-05 MED ORDER — ONDANSETRON HCL 4 MG/2ML IJ SOLN
INTRAMUSCULAR | Status: DC | PRN
  Administered 2011-07-05: 4 mg via INTRAVENOUS

## 2011-07-05 MED ORDER — FENTANYL CITRATE 0.05 MG/ML IJ SOLN
INTRAMUSCULAR | Status: DC | PRN
  Administered 2011-07-05 (×2): 50 ug via INTRAVENOUS

## 2011-07-05 MED ORDER — TECHNETIUM TC 99M SULFUR COLLOID FILTERED
1.0000 | Freq: Once | INTRAVENOUS | Status: AC | PRN
  Administered 2011-07-05: 1 via INTRADERMAL

## 2011-07-05 MED ORDER — LACTATED RINGERS IV SOLN
INTRAVENOUS | Status: DC
  Administered 2011-07-05 (×2): via INTRAVENOUS

## 2011-07-05 MED ORDER — MORPHINE SULFATE 2 MG/ML IJ SOLN: 0.0500 mg/kg | INTRAMUSCULAR | Status: DC | PRN

## 2011-07-05 MED ORDER — LIDOCAINE HCL (CARDIAC) 20 MG/ML IV SOLN
INTRAVENOUS | Status: DC | PRN
  Administered 2011-07-05: 75 mg via INTRAVENOUS

## 2011-07-05 MED ORDER — EPHEDRINE SULFATE 50 MG/ML IJ SOLN
INTRAMUSCULAR | Status: DC | PRN
  Administered 2011-07-05 (×2): 10 mg via INTRAVENOUS

## 2011-07-05 MED ORDER — SODIUM CHLORIDE 0.9 % IJ SOLN: INTRAMUSCULAR | Status: DC | PRN

## 2011-07-05 MED ORDER — MIDAZOLAM HCL 5 MG/5ML IJ SOLN
INTRAMUSCULAR | Status: DC | PRN
  Administered 2011-07-05: 1 mg via INTRAVENOUS

## 2011-07-05 MED ORDER — MIDAZOLAM HCL 2 MG/2ML IJ SOLN
1.0000 mg | INTRAMUSCULAR | Status: DC | PRN
  Administered 2011-07-05: 1 mg via INTRAVENOUS

## 2011-07-05 MED ORDER — OXYCODONE HCL 5 MG PO TABS
5.0000 mg | ORAL_TABLET | Freq: Once | ORAL | Status: AC
  Administered 2011-07-05: 5 mg via ORAL

## 2011-07-05 MED ORDER — FENTANYL CITRATE 0.05 MG/ML IJ SOLN
50.0000 ug | INTRAMUSCULAR | Status: DC | PRN
  Administered 2011-07-05: 100 ug via INTRAVENOUS

## 2011-07-05 MED ORDER — HYDROMORPHONE HCL PF 1 MG/ML IJ SOLN: 0.2500 mg | INTRAMUSCULAR | Status: DC | PRN

## 2011-07-05 MED ORDER — SODIUM CHLORIDE 0.9 % IJ SOLN
INTRAMUSCULAR | Status: DC | PRN
  Administered 2011-07-05: 13:00:00 via INTRADERMAL

## 2011-07-05 MED ORDER — METHYLENE BLUE 1 % INJ SOLN: INTRAMUSCULAR | Status: DC | PRN

## 2011-07-05 MED ORDER — CEFAZOLIN SODIUM 1-5 GM-% IV SOLN
1.0000 g | INTRAVENOUS | Status: AC
  Administered 2011-07-05: 2 g via INTRAVENOUS

## 2011-07-05 SURGICAL SUPPLY — 57 items
APPLIER CLIP 11 MED OPEN (CLIP)
BINDER BREAST XXLRG (GAUZE/BANDAGES/DRESSINGS) ×2 IMPLANT
BLADE SURG 10 STRL SS (BLADE) ×2 IMPLANT
BLADE SURG 15 STRL LF DISP TIS (BLADE) ×1 IMPLANT
BLADE SURG 15 STRL SS (BLADE) ×1
CANISTER SUCTION 1200CC (MISCELLANEOUS) ×2 IMPLANT
CHLORAPREP W/TINT 26ML (MISCELLANEOUS) ×2 IMPLANT
CLIP APPLIE 11 MED OPEN (CLIP) IMPLANT
CLIP TI WIDE RED SMALL 6 (CLIP) ×2 IMPLANT
CLOTH BEACON ORANGE TIMEOUT ST (SAFETY) ×2 IMPLANT
COVER MAYO STAND STRL (DRAPES) ×2 IMPLANT
COVER PROBE W GEL 5X96 (DRAPES) ×2 IMPLANT
COVER TABLE BACK 60X90 (DRAPES) ×2 IMPLANT
DECANTER SPIKE VIAL GLASS SM (MISCELLANEOUS) IMPLANT
DERMABOND ADVANCED (GAUZE/BANDAGES/DRESSINGS) ×2
DERMABOND ADVANCED .7 DNX12 (GAUZE/BANDAGES/DRESSINGS) ×2 IMPLANT
DEVICE DUBIN W/COMP PLATE 8390 (MISCELLANEOUS) ×2 IMPLANT
DRAIN CHANNEL 19F RND (DRAIN) IMPLANT
DRAIN HEMOVAC 1/8 X 5 (WOUND CARE) IMPLANT
DRAPE LAPAROSCOPIC ABDOMINAL (DRAPES) ×2 IMPLANT
DRAPE UTILITY XL STRL (DRAPES) ×2 IMPLANT
ELECT COATED BLADE 2.86 ST (ELECTRODE) ×2 IMPLANT
ELECT REM PT RETURN 9FT ADLT (ELECTROSURGICAL) ×2
ELECTRODE REM PT RTRN 9FT ADLT (ELECTROSURGICAL) ×1 IMPLANT
EVACUATOR SILICONE 100CC (DRAIN) IMPLANT
GLOVE BIOGEL PI IND STRL 8 (GLOVE) ×1 IMPLANT
GLOVE BIOGEL PI INDICATOR 8 (GLOVE) ×1
GLOVE ECLIPSE 6.5 STRL STRAW (GLOVE) ×2 IMPLANT
GLOVE SS BIOGEL STRL SZ 7.5 (GLOVE) ×1 IMPLANT
GLOVE SUPERSENSE BIOGEL SZ 7.5 (GLOVE) ×1
GOWN PREVENTION PLUS XLARGE (GOWN DISPOSABLE) ×2 IMPLANT
GOWN PREVENTION PLUS XXLARGE (GOWN DISPOSABLE) ×2 IMPLANT
KIT MARKER MARGIN INK (KITS) ×2 IMPLANT
NDL SAFETY ECLIPSE 18X1.5 (NEEDLE) ×1 IMPLANT
NEEDLE HYPO 18GX1.5 SHARP (NEEDLE) ×1
NEEDLE HYPO 25X1 1.5 SAFETY (NEEDLE) ×4 IMPLANT
NS IRRIG 1000ML POUR BTL (IV SOLUTION) ×2 IMPLANT
PACK BASIN DAY SURGERY FS (CUSTOM PROCEDURE TRAY) ×2 IMPLANT
PAD ALCOHOL SWAB (MISCELLANEOUS) ×2 IMPLANT
PENCIL BUTTON HOLSTER BLD 10FT (ELECTRODE) ×2 IMPLANT
PIN SAFETY STERILE (MISCELLANEOUS) IMPLANT
SLEEVE SCD COMPRESS KNEE MED (MISCELLANEOUS) ×2 IMPLANT
SPONGE LAP 18X18 X RAY DECT (DISPOSABLE) IMPLANT
SPONGE LAP 4X18 X RAY DECT (DISPOSABLE) ×2 IMPLANT
SUT ETHILON 3 0 FSL (SUTURE) IMPLANT
SUT MON AB 4-0 PC3 18 (SUTURE) IMPLANT
SUT MON AB 5-0 PS2 18 (SUTURE) ×2 IMPLANT
SUT SILK 3 0 SH 30 (SUTURE) IMPLANT
SUT VIC AB 3-0 SH 27 (SUTURE) ×1
SUT VIC AB 3-0 SH 27X BRD (SUTURE) ×1 IMPLANT
SUT VICRYL 3-0 CR8 SH (SUTURE) IMPLANT
SYR CONTROL 10ML LL (SYRINGE) ×4 IMPLANT
TOWEL OR 17X24 6PK STRL BLUE (TOWEL DISPOSABLE) ×2 IMPLANT
TOWEL OR NON WOVEN STRL DISP B (DISPOSABLE) ×2 IMPLANT
TUBE CONNECTING 20X1/4 (TUBING) ×2 IMPLANT
WATER STERILE IRR 1000ML POUR (IV SOLUTION) IMPLANT
YANKAUER SUCT BULB TIP NO VENT (SUCTIONS) ×2 IMPLANT

## 2011-07-05 NOTE — Transfer of Care (Signed)
Immediate Anesthesia Transfer of Care Note  Patient: Joy Gonzales  Procedure(s) Performed: Procedure(s) (LRB): BREAST LUMPECTOMY WITH NEEDLE LOCALIZATION AND AXILLARY SENTINEL LYMPH NODE BX (Right)  Patient Location: PACU  Anesthesia Type: General  Level of Consciousness: awake  Airway & Oxygen Therapy: Patient Spontanous Breathing and Patient connected to face mask oxygen  Post-op Assessment: Report given to PACU RN and Post -op Vital signs reviewed and stable  Post vital signs: Reviewed and stable  Complications: No apparent anesthesia complications

## 2011-07-05 NOTE — Anesthesia Preprocedure Evaluation (Addendum)
Anesthesia Evaluation  Patient identified by MRN, date of birth, ID band Patient awake    Reviewed: Allergy & Precautions, H&P , NPO status , Patient's Chart, lab work & pertinent test results  Airway Mallampati: I TM Distance: >3 FB Neck ROM: Full    Dental  (+) Teeth Intact and Dental Advisory Given   Pulmonary  breath sounds clear to auscultation        Cardiovascular Rhythm:Regular Rate:Normal     Neuro/Psych    GI/Hepatic   Endo/Other  Morbid obesity  Renal/GU      Musculoskeletal   Abdominal   Peds  Hematology   Anesthesia Other Findings   Reproductive/Obstetrics                           Anesthesia Physical Anesthesia Plan  ASA: III  Anesthesia Plan: General   Post-op Pain Management:    Induction: Intravenous  Airway Management Planned: LMA  Additional Equipment:   Intra-op Plan:   Post-operative Plan: Extubation in OR  Informed Consent: I have reviewed the patients History and Physical, chart, labs and discussed the procedure including the risks, benefits and alternatives for the proposed anesthesia with the patient or authorized representative who has indicated his/her understanding and acceptance.   Dental advisory given  Plan Discussed with: CRNA, Anesthesiologist and Surgeon  Anesthesia Plan Comments:         Anesthesia Quick Evaluation  

## 2011-07-05 NOTE — Op Note (Signed)
Preoperative Diagnosis: cancer of the right breast  Postoprative Diagnosis: cancer of the right breast  Procedure: Procedure(s): BREAST LUMPECTOMY WITH NEEDLE LOCALIZATION AND AXILLARY SENTINEL LYMPH NODE BX and blue dye injection   Surgeon: Glenna Fellows T   Assistants: None   Anesthesia:  General LMA anesthesiaDiagnos  Indications:   Patient is a 71 year old female with clinical stage TI C. N0 M0 invasive mammary cancer of the upper right breast. After extensive discussion of treatment options and risks detailed elsewhere we have elected to proceed with needle localized lumpectomy and axillary lymph node biopsy.  Procedure Detail:  Patient is brought to the operating room, placed in supine position on the operating table, and laryngeal mask general anesthesia induced. She had preoperatively undergone injection of 1 mCi of technetium sulfur colloid intradermally around the right nipple. After patient timeout under sterile technique 5 cc of dilute methylene blue was injected subcutaneously beneath the right nipple and massaged. The entire right breast and anterior chest and upper arm were then widely sterilely prepped and draped. She received preoperative IV antibiotics and PAS were in place. The wire was directly through the mass at the 12:00 position. I made a curvilinear incision at the wire insertion site and dissection was carried down through the subcutaneous tissue. The mass at this point was palpable. Cautery dissection was used to completely excise the mass with a gross margin of normal subcutaneous and breast tissue.  The specimen was oriented with ink. Specimen mammography was obtained which showed good position of the clip and the mass in the central portion of the specimen. The neoprobe was used to localize a hot area in the right axilla. A small incision was made in a skin crease and dissection carried down through the subcutaneous tissue with cautery. The axilla was carefully  entered with blunt dissection. Using the neoprobe for guidance I was able to dissect down onto a hot area in the axilla and elevate this and with further blunt dissection identified a blue lymph node with high counts. This was excised with cautery with some small amount of surrounding tissue and ex vivo the node had counts in excess of 4500 and background in the axilla was less than 100. This was sent for permanent section as hot blue right axillary sentinel lymph node.  Both incisions were your gated and complete hemostasis assured. Soft tissue was infiltrated with Marcaine with epinephrine. The lumpectomy cavity was marked with clips. The breast tissue and subcutaneous tissue were closed with interrupted 3-0 Vicryl as was the deep subcutaneous tissue in the axilla and both skin incisions closed with subcuticular Monocryl and Dermabond. Sponge needle and instrument counts were correct.    Estimated Blood Loss:  Minimal         Drains: none  Blood Given: none          Specimens: #1 right breast lumpectomy #2 right axillary sentinel lymph node        Complications:  * No complications entered in OR log *         Disposition: PACU - hemodynamically stable.         Condition: stable  Mariella Saa MD, FACS  07/05/2011, 2:41 PM

## 2011-07-05 NOTE — Discharge Instructions (Signed)
[facility] °Office Phone Number [phone] ° °BREAST BIOPSY/ PARTIAL MASTECTOMY: POST OP INSTRUCTIONS ° °Always review your discharge instruction sheet given to you by the facility where your surgery was performed. ° °IF YOU HAVE DISABILITY OR FAMILY LEAVE FORMS, YOU MUST BRING THEM TO THE OFFICE FOR PROCESSING.  DO NOT GIVE THEM TO YOUR DOCTOR. ° °1. A prescription for pain medication may be given to you upon discharge.  Take your pain medication as prescribed, if needed.  If narcotic pain medicine is not needed, then you may take acetaminophen (Tylenol) or ibuprofen (Advil) as needed. °2. Take your usually prescribed medications unless otherwise directed °3. If you need a refill on your pain medication, please contact your pharmacy.  They will contact our office to request authorization.  Prescriptions will not be filled after 5pm or on week-ends. °4. You should eat very light the first 24 hours after surgery, such as soup, crackers, pudding, etc.  Resume your normal diet the day after surgery. °5. Most patients will experience some swelling and bruising in the breast.  Ice packs and a good support bra will help.  Swelling and bruising can take several days to resolve.  °6. It is common to experience some constipation if taking pain medication after surgery.  Increasing fluid intake and taking a stool softener will usually help or prevent this problem from occurring.  A mild laxative (Milk of Magnesia or Miralax) should be taken according to package directions if there are no bowel movements after 48 hours. °7. Unless discharge instructions indicate otherwise, you may remove your bandages 24-48 hours after surgery, and you may shower at that time.  You may have steri-strips (small skin tapes) in place directly over the incision.  These strips should be left on the skin for 7-10 days.  If your surgeon used skin glue on the incision, you may shower in 24 hours.  The glue will flake off over the  next 2-3 weeks.  Any sutures or staples will be removed at the office during your follow-up visit. °8. ACTIVITIES:  You may resume regular daily activities (gradually increasing) beginning the next day.  Wearing a good support bra or sports bra minimizes pain and swelling.  You may have sexual intercourse when it is comfortable. °a. You may drive when you no longer are taking prescription pain medication, you can comfortably wear a seatbelt, and you can safely maneuver your car and apply brakes. °b. RETURN TO WORK:  ______________________________________________________________________________________ °9. You should see your doctor in the office for a follow-up appointment approximately two weeks after your surgery.  Your doctor’s nurse will typically make your follow-up appointment when she calls you with your pathology report.  Expect your pathology report 2-3 business days after your surgery.  You may call to check if you do not hear from us after three days. °10. OTHER INSTRUCTIONS: _______________________________________________________________________________________________ _____________________________________________________________________________________________________________________________________ °_____________________________________________________________________________________________________________________________________ °_____________________________________________________________________________________________________________________________________ ° °WHEN TO CALL YOUR DOCTOR: °1. Fever over 101.0 °2. Nausea and/or vomiting. °3. Extreme swelling or bruising. °4. Continued bleeding from incision. °5. Increased pain, redness, or drainage from the incision. ° °The clinic staff is available to answer your questions during regular business hours.  Please don’t hesitate to call and ask to speak to one of the nurses for clinical concerns.  If you have a medical emergency, go to the nearest  emergency room or call 911.  A surgeon from [facility] is always on call at the hospital. ° °For further questions, please visit [email or web address]  ° ° °  Post Anesthesia Home Care Instructions ° °Activity: °Get plenty of rest for the remainder of the day. A responsible adult should stay with you for 24 hours following the procedure.  °For the next 24 hours, DO NOT: °-Drive a car °-Operate machinery °-Drink alcoholic beverages °-Take any medication unless instructed by your physician °-Make any legal decisions or sign important papers. ° °Meals: °Start with liquid foods such as gelatin or soup. Progress to regular foods as tolerated. Avoid greasy, spicy, heavy foods. If nausea and/or vomiting occur, drink only clear liquids until the nausea and/or vomiting subsides. Call your physician if vomiting continues. ° °Special Instructions/Symptoms: °Your throat may feel dry or sore from the anesthesia or the breathing tube placed in your throat during surgery. If this causes discomfort, gargle with warm salt water. The discomfort should disappear within 24 hours. ° °

## 2011-07-05 NOTE — Anesthesia Procedure Notes (Signed)
Procedure Name: LMA Insertion Date/Time: 07/05/2011 1:07 PM Performed by: Zenia Resides D Pre-anesthesia Checklist: Patient identified, Emergency Drugs available, Suction available, Patient being monitored and Timeout performed Patient Re-evaluated:Patient Re-evaluated prior to inductionOxygen Delivery Method: Circle System Utilized Preoxygenation: Pre-oxygenation with 100% oxygen Intubation Type: IV induction Ventilation: Mask ventilation without difficulty LMA: LMA with gastric port inserted LMA Size: 4.0 Number of attempts: 1 Placement Confirmation: positive ETCO2 and breath sounds checked- equal and bilateral Tube secured with: Tape Dental Injury: Teeth and Oropharynx as per pre-operative assessment

## 2011-07-05 NOTE — Anesthesia Postprocedure Evaluation (Signed)
Anesthesia Post Note  Patient: Joy Gonzales  Procedure(s) Performed: Procedure(s) (LRB): BREAST LUMPECTOMY WITH NEEDLE LOCALIZATION AND AXILLARY SENTINEL LYMPH NODE BX (Right)  Anesthesia type: General  Patient location: PACU  Post pain: Pain level controlled  Post assessment: Patient's Cardiovascular Status Stable  Last Vitals:  Filed Vitals:   07/05/11 1500  BP: 132/58  Pulse: 101  Temp:   Resp: 23    Post vital signs: Reviewed and stable  Level of consciousness: alert  Complications: No apparent anesthesia complications

## 2011-07-05 NOTE — H&P (View-Only) (Signed)
Subjective:   new diagnosis right breast cancer  Patient ID: Joy Gonzales, female   DOB: 06/06/1940, 71 y.o.   MRN: [id number]  HPI Patient is a very pleasant 71-year-old female referred by Dr. Arceo from the breast imaging Center for a new diagnosis of invasive cancer of the right breast. The patient has had yearly mammograms for many years. She had a normal mammogram in July of last year. She recently felt a lump in the upper aspect of her right breast. She saw Dr. Gottsegan and this was confirmed.subsequent diagnostic mammogram revealed only scattered densities but ultrasound confirmed a 1.4 cm hypoechoic mass in the area of the palpable abnormality. Subsequent large core needle biopsy was performed. This has revealed invasive mammary carcinoma grade 2-3, ER PR positive and HER-2/neu negative. Lymphovascular invasion was noted. Subsequent MRI has confirmed a 1.6 cm mass with no other abnormalities and no evidence of lymphadenopathy. The patient has not noted any other lumps in either breast. No nipple discharge or skin changes or unusual pain or other worrisome symptoms.  Past Medical History  Diagnosis Date  . HTN (hypertension)   . GERD (gastroesophageal reflux disease)   . Hyperlipemia   . Elevated LFTs   . Tinnitus   . Flushing   . Diverticulosis   . Vocal cord granuloma   . IBS (irritable bowel syndrome)   . Rectocele   . Lichen sclerosus   . Atrophic vaginitis   . HSV-1 infection   . Osteopenia   . Dyspnea     on exertion  . Bladder cancer    Past Surgical History  Procedure Date  . Tonsillectomy and adenoidectomy   . Throat surgery     LARYN. GRANULOMA  POLYP  . Colposcopy   . Cholecystectomy   . Bladder tumor excision   . Breast tumor removed   . Breast surgery   . Hysteroscopy w/d&c 05/18/2011    Procedure: DILATATION AND CURETTAGE /HYSTEROSCOPY;  Surgeon: Daniel L Gottsegen, MD;  Location: [facility];  Service: Gynecology;  Laterality: N/A;  or Tuesday, March 12 7:30am? or  Thursday, March 14 7:30am? or Tuesday March 19 7:30am?  . Dilation and curettage of uterus   . Hysteroscopy    Current Outpatient Prescriptions  Medication Sig Dispense Refill  . ALPRAZolam (XANAX) 0.25 MG tablet Take 0.125 mg by mouth daily as needed. Take infrequently      . aspirin 81 MG tablet Take 81 mg by mouth daily.        . esomeprazole (NEXIUM) 40 MG capsule Take 40 mg by mouth daily.        . estradiol (ESTRING) 2 MG vaginal ring Place 2 mg vaginally every 3 (three) months. follow package directions  1 each  4  . fenofibrate micronized (LOFIBRA) 200 MG capsule Take 200 mg by mouth daily.        . fexofenadine (ALLEGRA) 180 MG tablet Take 180 mg by mouth as needed.        . metoprolol succinate (TOPROL XL) 25 MG 24 hr tablet Take 25 mg by mouth daily.        . olmesartan-hydrochlorothiazide (BENICAR HCT) 20-12.5 MG per tablet Take 1 tablet by mouth daily.  30 tablet  11  . ranitidine (ZANTAC) 75 MG tablet Take 75 mg by mouth at bedtime.       . valACYclovir (VALTREX) 500 MG tablet Take 500 mg by mouth 2 (two) times daily. When she has a fever blister only         Allergies  Allergen Reactions  . Plendil Palpitations    tachycardia   History  Substance Use Topics  . Smoking status: Never Smoker   . Smokeless tobacco: Never Used  . Alcohol Use: 0.0 oz/week     very rare     Review of Systems  Constitutional: Negative.   HENT: Positive for sore throat and voice change.   Respiratory: Negative.   Cardiovascular: Negative.   Gastrointestinal: Positive for diarrhea and constipation.       Colonoscopy is up to date  Genitourinary: Positive for urgency.  Musculoskeletal: Negative.   Neurological: Negative.   Psychiatric/Behavioral: Negative.        Objective:   Physical Exam General: Alert, well-developed Caucasian female, in no distress Skin: Warm and dry without rash or infection. HEENT: No palpable masses or thyromegaly. Sclera nonicteric. Pupils equal round and  reactive. Oropharynx clear. Lymph nodes: No cervical, supraclavicular, or inguinal nodes palpable. Breasts: There is some bruising and a small palpable mass about 4 or 5 cm above the right nipple at the 12:00 position. Well-healed incision periareolar on the right. I cannot feel any other masses in either breast. Lungs: Breath sounds clear and equal without increased work of breathing Cardiovascular: Regular rate and rhythm without murmur. No JVD or edema. Peripheral pulses intact. Abdomen: Nondistended. Soft and nontender. No masses palpable. No organomegaly. No palpable hernias. Extremities: No edema or joint swelling or deformity. No chronic venous stasis changes. Neurologic: Alert and fully oriented. Gait normal.    Assessment:     New diagnosis clinical stage TI C. 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 We had an extensive discussion regarding the initial surgical treatment options. She would certainly be a candidate for breast conservation which is what she would prefer. We discussed the nature of the surgery and risks of bleeding, infection, slight risk of lymphedema, subsequent adjuvant treatment, and possibility of further surgery based on final pathology findings. I recommended needle localization with right breast lumpectomy and axillary sentinel lymph node biopsy. All her questions were answered.    Plan:     Needle localized right breast lumpectomy with axillary sentinel lymph node biopsy as an outpatient.      

## 2011-07-05 NOTE — Interval H&P Note (Signed)
History and Physical Interval Note:  07/05/2011 12:48 PM  Joy Gonzales  has presented today for surgery, with the diagnosis of cancer of the right breast  The various methods of treatment have been discussed with the patient and family. After consideration of risks, benefits and other options for treatment, the patient has consented to  Procedure(s) (LRB): BREAST LUMPECTOMY WITH NEEDLE LOCALIZATION AND AXILLARY SENTINEL LYMPH NODE BX (Right) as a surgical intervention .  The patients' history has been reviewed, patient examined, no change in status, stable for surgery.  I have reviewed the patients' chart and labs.  Questions were answered to the patient's satisfaction.     [name]

## 2011-07-06 ENCOUNTER — Telehealth (INDEPENDENT_AMBULATORY_CARE_PROVIDER_SITE_OTHER): Payer: Self-pay | Admitting: General Surgery

## 2011-07-10 ENCOUNTER — Ambulatory Visit (INDEPENDENT_AMBULATORY_CARE_PROVIDER_SITE_OTHER): Payer: Medicare Other | Admitting: General Surgery

## 2011-07-10 ENCOUNTER — Encounter (INDEPENDENT_AMBULATORY_CARE_PROVIDER_SITE_OTHER): Payer: Self-pay | Admitting: General Surgery

## 2011-07-10 ENCOUNTER — Telehealth (INDEPENDENT_AMBULATORY_CARE_PROVIDER_SITE_OTHER): Payer: Self-pay | Admitting: General Surgery

## 2011-07-10 VITALS — BP 123/75 | HR 86 | Temp 97.0°F | Resp 16 | Ht 63.0 in | Wt 188.4 lb

## 2011-07-10 DIAGNOSIS — L259 Unspecified contact dermatitis, unspecified cause: Secondary | ICD-10-CM | POA: Insufficient documentation

## 2011-07-10 MED ORDER — HYDROCORTISONE 1 % EX CREA: TOPICAL_CREAM | CUTANEOUS | Status: DC

## 2011-07-10 NOTE — Telephone Encounter (Signed)
Joy Gonzales scheduled in Urgent Office w/Dr. Carolynne Edouard for further evaluation of skin rash.

## 2011-07-10 NOTE — Progress Notes (Signed)
Subjective:     Patient ID: Joy Gonzales, female   DOB: [birth year], 71 y.o.   MRN: [id number]  HPI The patient is a 71 year old white female who is about 5 days out from a right breast lumpectomy and sentinel mode mapping. Her main concern is that she has developed a red raised rash over her sternal area. The area is not painful for her.  Review of Systems     Objective:   Physical Exam On exam her right lumpectomy and axillary incisions are healing well. She does have some redness centered around the nipple areola which is not tender. She also has a papular linear and clustered rash over her sternum between her breasts    Assessment:     Status post right lumpectomy and sentinel node mapping now with what appears to be contact dermatitis over her sternal area. I believe the retina centered around the nipple and areola is a reaction to the nuclear tracer.    Plan:     At this point all recommend some hydrocortisone cream to the affected areas of rash. She has a follow up appointment next week with Dr. Johna Sheriff already scheduled. She agrees to call us if the rash does not improve before the weekend.

## 2011-07-10 NOTE — Patient Instructions (Signed)
Apply steroid to affected area 3 times a day

## 2011-07-10 NOTE — Telephone Encounter (Signed)
Pt calling to report rash has developed (Friday onset) under the Rt breast.  It is slightly itching, but red and raw.  She used Neosporin over weekend, with little effect.  She is concerned it may be a staph infection.  Advised to stop Neosporin until advised otherwise.

## 2011-07-11 ENCOUNTER — Encounter: Payer: Self-pay | Admitting: *Deleted

## 2011-07-11 NOTE — Progress Notes (Signed)
Ordered Oncotype Dx test w/ [facility].  Faxed request to [facility]. 

## 2011-07-20 ENCOUNTER — Ambulatory Visit (INDEPENDENT_AMBULATORY_CARE_PROVIDER_SITE_OTHER): Payer: Medicare Other | Admitting: General Surgery

## 2011-07-20 VITALS — BP 130/84 | HR 70 | Temp 97.0°F | Resp 16 | Ht 63.0 in | Wt 187.4 lb

## 2011-07-20 DIAGNOSIS — L259 Unspecified contact dermatitis, unspecified cause: Secondary | ICD-10-CM

## 2011-07-20 NOTE — Progress Notes (Signed)
History: Patient returns 2 weeks following right breast lumpectomy and sentinel lymph node biopsy. She reports she is doing well as far as her surgery and incisions.  Exam: Both incisions are healing well without complication. There is some mild to moderate induration beneath the breast incision and some faint erythema around the nipple but it does not really look infected.  We reviewed her pathology. Presents although was negative. She had a grade 3, 1.7 cm tumor ER positive. One margin was within 1 mm but microscopically negative and all of the margins clearly negative.  Oncotype is pending.  Assessment and plan: Doing well postoperatively without complication. She has one close margin but it is microscopically negative with invasive disease and I don't believe that further surgery is needed. She has followup planned in the cancer center and I will see her back for further wound check in 4 weeks.

## 2011-07-21 ENCOUNTER — Encounter: Payer: Self-pay | Admitting: *Deleted

## 2011-07-21 ENCOUNTER — Telehealth: Payer: Self-pay | Admitting: *Deleted

## 2011-07-21 NOTE — Telephone Encounter (Signed)
per orders from 07-21-2011 moved patient's appointment to 08-21-2011 at 3:30pm patient confirmed over the phone on 07-21-2011

## 2011-07-27 ENCOUNTER — Ambulatory Visit: Payer: Medicare Other | Admitting: Oncology

## 2011-07-31 DIAGNOSIS — C50919 Malignant neoplasm of unspecified site of unspecified female breast: Secondary | ICD-10-CM | POA: Diagnosis not present

## 2011-08-01 ENCOUNTER — Encounter: Payer: Self-pay | Admitting: *Deleted

## 2011-08-01 NOTE — Progress Notes (Signed)
Received Oncotype Dx results of 9.  Gave copy to MD.  Took copy to Med Rec to scan.  

## 2011-08-04 ENCOUNTER — Telehealth: Payer: Self-pay | Admitting: *Deleted

## 2011-08-04 NOTE — Telephone Encounter (Signed)
Spoke to pt about the delay in f/u with Dr. Welton Flakes.  Explained about the delay in sending out her Oncotype until 14 day post surgery and that it takes approximately 2 wks for the test results to return.  Pt denies further needs or concerns at this time.  Encourage pt to call with questions.  Received verbal understanding.  Contact information given.

## 2011-08-08 ENCOUNTER — Telehealth: Payer: Self-pay | Admitting: *Deleted

## 2011-08-08 NOTE — Telephone Encounter (Signed)
Gave oncotype results of 9.  Informed we will be calling to schedule f/u appt with Dr. Basilio Cairo and confirmed appt for 6/10 with Dr. Welton Flakes.

## 2011-08-11 ENCOUNTER — Ambulatory Visit (INDEPENDENT_AMBULATORY_CARE_PROVIDER_SITE_OTHER): Payer: Medicare Other | Admitting: General Surgery

## 2011-08-11 ENCOUNTER — Encounter (INDEPENDENT_AMBULATORY_CARE_PROVIDER_SITE_OTHER): Payer: Self-pay | Admitting: General Surgery

## 2011-08-11 VITALS — BP 140/88 | HR 62 | Temp 97.7°F | Resp 14 | Ht 63.0 in | Wt 187.2 lb

## 2011-08-11 DIAGNOSIS — C50419 Malignant neoplasm of upper-outer quadrant of unspecified female breast: Secondary | ICD-10-CM

## 2011-08-11 NOTE — Progress Notes (Signed)
History: The patient returns for further postoperative followup after right breast lumpectomy and sentinel lymph node biopsy for T1 C. N0 carcinoma right breast. She reports a little bit of firmness next the nipple but is feeling well. She has appointment scheduled with medical and radiation oncology.  Exam: Breast and axillary wounds are well healed with just a little minimal induration as expected of the breast wound.  Assessment plan: Doing well postoperatively. Medical and radiation oncology followups are scheduled. I will see her back in 6 months.

## 2011-08-16 ENCOUNTER — Ambulatory Visit
Admission: RE | Admit: 2011-08-16 | Discharge: 2011-08-16 | Disposition: A | Discharge status: Home / Self Care | Payer: Medicare Other | Source: Ambulatory Visit | Attending: Radiation Oncology | Admitting: Radiation Oncology

## 2011-08-16 ENCOUNTER — Encounter: Payer: Self-pay | Admitting: Radiation Oncology

## 2011-08-16 VITALS — BP 111/69 | HR 66 | Wt 187.5 lb

## 2011-08-16 DIAGNOSIS — C50419 Malignant neoplasm of upper-outer quadrant of unspecified female breast: Secondary | ICD-10-CM

## 2011-08-16 DIAGNOSIS — Z79899 Other long term (current) drug therapy: Secondary | ICD-10-CM | POA: Insufficient documentation

## 2011-08-16 DIAGNOSIS — Z510 Encounter for antineoplastic radiation therapy: Secondary | ICD-10-CM | POA: Diagnosis not present

## 2011-08-16 NOTE — Progress Notes (Signed)
Radiation Oncology         [phone] ________________________________  Name: ARDIE DRAGOO MRN: [id number]  Date: 08/16/2011  DOB: Nov 12, 1940  Follow-Up Visit Note  CC: Garlan Fillers, MD, MD  Johna Sheriff Lorne Skeens, MD  Diagnosis:  [id number] ER/PR positive HER-2/neu negative grade 3 invasive ductal carcinoma of the upper-outer quadrant of the right breast Ki-67 50%  Narrative:  The patient returns today for followup after undergoing lumpectomy and sentinel lymph node biopsy on 07/05/2011. She is doing well. She underwent Oncotype DX testing and she is felt to be low risk. Her single sentinel lymph node was negative at surgery. Her margins are negative but she does have a close margin at 0.1 mm anteriorly. The DCIS closest margin is 0.7 cm. The tumor was 1.7 cm, grade 3. We reviewed the patient's imaging and pathology this morning at tumor board.        She is concerned about a dark mole at the inframammary fold of the right breast that she's not noticed before             ALLERGIES:  is allergic to adhesive and felodipine er.  Meds: Current Outpatient Prescriptions  Medication Sig Dispense Refill  . ALPRAZolam (XANAX) 0.25 MG tablet Take 0.125 mg by mouth daily as needed. Take infrequently      . aspirin 81 MG tablet Take 81 mg by mouth daily.        . clobetasol ointment (TEMOVATE) 0.05 %       . esomeprazole (NEXIUM) 40 MG capsule Take 40 mg by mouth daily.        . fenofibrate micronized (LOFIBRA) 200 MG capsule Take 200 mg by mouth daily.        . fexofenadine (ALLEGRA) 180 MG tablet Take 180 mg by mouth as needed.        Marland Kitchen HYDROcodone-acetaminophen (NORCO) 5-325 MG per tablet       . hydrocortisone cream 1 % Apply to affected area 3 times daily  30 g  1  . metoprolol succinate (TOPROL XL) 25 MG 24 hr tablet Take 25 mg by mouth daily.        Marland Kitchen olmesartan-hydrochlorothiazide (BENICAR HCT) 20-12.5 MG per tablet Take 1 tablet by mouth daily.      . ranitidine (ZANTAC) 75 MG tablet Take  75 mg by mouth at bedtime.       . valACYclovir (VALTREX) 500 MG tablet Take 500 mg by mouth 2 (two) times daily. When she has a fever blister only        Physical Findings: The patient is in no acute distress. Patient is alert and oriented.  weight is 187 lb 8 oz (85.049 kg). Her blood pressure is 111/69 and her pulse is 66. Marland Kitchen  She is in no acute distress. She has healed well from surgery. Right breast exam is notable for a well-healed lumpectomy scar as well as a scar at the inferior aspect of the right areola from a previous biopsy of a benign lesion years ago. There is little bit of postsurgical firmness in the lumpectomy region but no severe seroma. She has a little bit of right nipple inversion. There are no palpable lesions of concern in either breast nor any palpable adenopathy in the axillary region. Skin exam is notable for somewhat scaly and dark moles consistent with seborrheic keratoses, including at least 2 over the right breast.   Lab Findings: Lab Results  Component Value Date   WBC 5.2 06/28/2011  HGB 14.9 06/28/2011   HCT 44.7 06/28/2011   MCV 92.3 06/28/2011   PLT 191 06/28/2011    CMP     Component Value Date/Time   NA 140 07/03/2011 1330   K 4.0 07/03/2011 1330   CL 103 07/03/2011 1330   CO2 26 07/03/2011 1330   GLUCOSE 117* 07/03/2011 1330   BUN 14 07/03/2011 1330   CREATININE 0.76 07/03/2011 1330   CALCIUM 9.7 07/03/2011 1330   PROT 6.9 06/28/2011 0809   ALBUMIN 4.1 06/28/2011 0809   AST 33 06/28/2011 0809   ALT 43* 06/28/2011 0809   ALKPHOS 79 06/28/2011 0809   BILITOT 0.4 06/28/2011 0809   GFRNONAA 82* 07/03/2011 1330   GFRAA >90 07/03/2011 1330      Impression/Plan:  Ms. Stegemann is recovering well from surgery. Her Oncotype DX score was low. She does have a close anterior margin. I think it's reasonable that she proceed with radiotherapy. I can boost the lumpectomy cavity to provide additional dose in light of her close margin. I have scheduled the patient for CT  simulation in 2 days. I spoke with the patient and her husband about the treatment planning process as well as the acute and potential late effects of radiotherapy for right breast cancer. I explained that she'll probably receive 6 weeks of radiotherapy in 30 fractions. However, after taking measurements, I may be able to safely hypofractionate her treatment in which case she will finish in about 4 weeks.  She is concerned about the mole at the inframammary fold of her right breast and I explained her that it appears very consistent with seborrheic keratosis. I told her that it may become  more raised or scaly  and possibly flake off during radiation treatment. She knows to inform me if it grows significantly in the months following radiotherapy. In that case I would refer her to a dermatologist.  A consent form has been signed and placed in the patient's chart. I look forward to participating in her care   -----------------------------------  Lonie Peak, MD

## 2011-08-16 NOTE — Progress Notes (Signed)
[facility] Radiation Oncology Review Of Systems  Name: Joy Gonzales MRN: [id number]  Date:  08/16/2011            DOB: Jun 26, 1940  Status:outpatient   Drug Allergies:  Allergies  Allergen Reactions  . Felodipine Er Palpitations    tachycardia    Symptoms since last visit at this office:  CONSTITUTIONAL: None  EYES:Glasses for reading  EARS/NOSE/THROAT: See Surgeries in hx on vocal cords  HEART:None  LUNG: Shortness of Breath when walking up stairs  Stairs and Coughing frequently  Dry  STOMACH/BOWEL: Reflux  GENITOURINARY: Cystoscopy yearly for her Hx of Bladder Cancer  BREASTS: Dx with Breast Cancer on 06/20/11  SKIN: large Dark mole underneath right breast  MUSCLE/BONES: None  NERVOUS SYSTEM:None  MENTAL HEALTH:Anxiety  HORMONES/REPRODUCTIVE: Hot Flashes  BLOOD/LYMPH SYSTEM: None  IMMUNE: None  ADDITIONAL INFORMATION/CONCERNS:

## 2011-08-16 NOTE — Progress Notes (Signed)
[place] today.  Reports soreness in her right breast since surgery.  Joy Gonzales is concerned about a "large, dark mole" underneath her right breast.  Dr. Basilio Cairo informed.  Accompanied by her husband today.  ROS completed.

## 2011-08-16 NOTE — Progress Notes (Signed)
Encounter addended by: Delynn Flavin, RN on: 08/16/2011  1:26 PM<BR>     Documentation filed: Charges VN

## 2011-08-18 ENCOUNTER — Ambulatory Visit
Admission: RE | Admit: 2011-08-18 | Discharge: 2011-08-18 | Disposition: A | Discharge status: Home / Self Care | Payer: Medicare Other | Source: Ambulatory Visit | Attending: Radiation Oncology | Admitting: Radiation Oncology

## 2011-08-18 DIAGNOSIS — C50419 Malignant neoplasm of upper-outer quadrant of unspecified female breast: Secondary | ICD-10-CM

## 2011-08-18 DIAGNOSIS — Z79899 Other long term (current) drug therapy: Secondary | ICD-10-CM | POA: Diagnosis not present

## 2011-08-18 DIAGNOSIS — Z510 Encounter for antineoplastic radiation therapy: Secondary | ICD-10-CM | POA: Diagnosis not present

## 2011-08-18 NOTE — Progress Notes (Signed)
Met with patient to discuss RO billing.  Rad Tx: [id number] Extrl Beam  Dx: 174.4 Upper-outer quadrant of breast  Attending Rad: Dr. Basilio Cairo

## 2011-08-18 NOTE — Progress Notes (Signed)
Simulation Treatment Planning Note for Right Breast cancer  Ms. Joy Gonzales will receive whole breast radiotherapy. The patient was laid in the supine position on the treatment table with her arms over her head. I placed adhesive wiring over her lumpectomy scar and around the borders of her right breast tissue . High-resolution CT axial imaging was obtained of the patient's chest. An isocenter was placed in her anterior right lung. Skin markings were made and she tolerated the procedure well without any complications.  Treatment planning note: the patient will be treated with opposed tangential fields using MLCs for custom blocks. I plan to prescribe 50 Gy in 25 fractions to the right breast (due to her torso/breast size, hypofractionation would not be ideal).  Then, she will receive a boost of 10 Gy in 5 fractions to the lumpectomy cavity with electrons.  I am requested a special port plan and custom electron cutout for the boost.

## 2011-08-21 ENCOUNTER — Encounter: Payer: Self-pay | Admitting: Oncology

## 2011-08-21 ENCOUNTER — Ambulatory Visit (HOSPITAL_BASED_OUTPATIENT_CLINIC_OR_DEPARTMENT_OTHER): Payer: Medicare Other | Admitting: Lab

## 2011-08-21 ENCOUNTER — Ambulatory Visit (HOSPITAL_BASED_OUTPATIENT_CLINIC_OR_DEPARTMENT_OTHER): Payer: Medicare Other | Admitting: Oncology

## 2011-08-21 ENCOUNTER — Telehealth: Payer: Self-pay | Admitting: Oncology

## 2011-08-21 VITALS — BP 120/79 | HR 67 | Temp 98.7°F | Ht 63.0 in | Wt 187.9 lb

## 2011-08-21 DIAGNOSIS — Z170 Estrogen receptor positive status [ER+]: Secondary | ICD-10-CM | POA: Diagnosis not present

## 2011-08-21 DIAGNOSIS — C50419 Malignant neoplasm of upper-outer quadrant of unspecified female breast: Secondary | ICD-10-CM

## 2011-08-21 LAB — CBC WITH DIFFERENTIAL/PLATELET
Eosinophils Absolute: 0.2 10*3/uL (ref 0.0–0.5)
LYMPH%: 38.1 % (ref 14.0–49.7)
MONO#: 0.6 10*3/uL (ref 0.1–0.9)
NEUT#: 3.2 10*3/uL (ref 1.5–6.5)
Platelets: 183 10*3/uL (ref 145–400)
RBC: 4.76 10*6/uL (ref 3.70–5.45)
RDW: 13 % (ref 11.2–14.5)
WBC: 6.8 10*3/uL (ref 3.9–10.3)

## 2011-08-21 LAB — COMPREHENSIVE METABOLIC PANEL
Albumin: 3.9 g/dL (ref 3.5–5.2)
CO2: 28 mEq/L (ref 19–32)
Glucose, Bld: 93 mg/dL (ref 70–99)
Potassium: 3.5 mEq/L (ref 3.5–5.3)
Sodium: 143 mEq/L (ref 135–145)
Total Protein: 6.7 g/dL (ref 6.0–8.3)

## 2011-08-21 NOTE — Progress Notes (Signed)
OFFICE PROGRESS NOTE  CC  Joy Fillers, MD, MD [address] [facility], [place]. [place] [address] Dr. Lonie Peak Dr. 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  PRIOR THERAPY:   #1 patient was originally seen at the multidisciplinary breast clinic with new diagnosis of right breast mass. She has since gone on to have a right breast lumpectomy.  #2 the pathology showed a 1.7 cm invasive ductal carcinoma grade 3 without LV I but associated ductal carcinoma in situ. One sentinel node was negative for metastatic disease.the tumor was ER +100% PR +100% Ki-67 50% and HER-2/neu negative  #3 patient had an Oncotype DX testing done that showed a breast cancer recurrence score of 9 giving her a 5 year risk of distant recurrence of 6%    #4 patient is now beginning radiation therapy to the residual right breast.  CURRENT THERAPY:she will begin radiation therapy to the right breast.  INTERVAL HISTORY: Joy Gonzales 71 y.o. female returns for followup visit post lumpectomy. She has RE been seen by Dr. Doristine Devoid and has been simulated for radiation. Patient had Oncotype DX testing performed that showed a breast cancer recurrence score of only 9 giving her 5 year risk of distant recurrence of about 6% with antiestrogen therapy. I have explained this to the patient and her husband. Clinically today patient seems to be doing well she is without any significant complaints she denies any fevers chills night sweats headaches shortness of breath chest pains palpitations she has no myalgias or arthralgias. Remainder of the 10 point review of systems is negative.  MEDICAL HISTORY: Past Medical History  Diagnosis Date  . HTN (hypertension)   . GERD (gastroesophageal reflux disease)   . Hyperlipemia   . Elevated LFTs     . Tinnitus   . Diverticulosis   . Vocal cord granuloma   . IBS (irritable bowel syndrome)   . Rectocele   . Lichen sclerosus   . Atrophic vaginitis   . HSV-1 infection   . Osteopenia   . Bladder cancer   . Flushing   . Dyspnea     on exertion  . PONV (postoperative nausea and vomiting)     ALLERGIES:  is allergic to adhesive and felodipine er.  MEDICATIONS:  Current Outpatient Prescriptions  Medication Sig Dispense Refill  . ALPRAZolam (XANAX) 0.25 MG tablet Take 0.125 mg by mouth daily as needed. Take infrequently      . aspirin 81 MG tablet Take 81 mg by mouth daily.        Marland Kitchen esomeprazole (NEXIUM) 40 MG capsule Take 40 mg by mouth daily.        . fenofibrate micronized (LOFIBRA) 200 MG capsule Take 200 mg by mouth daily.        . fexofenadine (ALLEGRA) 180 MG tablet Take 180 mg by mouth as needed.        . metoprolol succinate (TOPROL XL) 25 MG 24 hr tablet Take 25 mg by mouth daily.        Marland Kitchen olmesartan-hydrochlorothiazide (BENICAR HCT) 20-12.5 MG per tablet Take 1 tablet by mouth daily.      . ranitidine (ZANTAC) 75 MG tablet Take 75 mg by mouth at bedtime.       . clobetasol ointment (TEMOVATE) 0.05 %       . HYDROcodone-acetaminophen (NORCO) 5-325 MG  per tablet       . hydrocortisone cream 1 % Apply to affected area 3 times daily  30 g  1  . valACYclovir (VALTREX) 500 MG tablet Take 500 mg by mouth 2 (two) times daily. When she has a fever blister only        SURGICAL HISTORY:  Past Surgical History  Procedure Date  . Tonsillectomy and adenoidectomy   . Throat surgery     LARYN. GRANULOMA  POLYP  . Colposcopy   . Cholecystectomy   . Bladder tumor excision   . Breast tumor removed   . Hysteroscopy w/d&c 05/18/2011    Procedure: DILATATION AND CURETTAGE /HYSTEROSCOPY;  Surgeon: Trellis Paganini, MD;  Location: [facility];  Service: Gynecology;  Laterality: N/A;  or Tuesday, March 12 7:30am? or Thursday, March 14 7:30am? or Tuesday March 19 7:30am?  . Dilation and  curettage of uterus   . Hysteroscopy   . Needle core biospy 06/20/2011    Right Breast, 12'Oclock - Invasive Mammary Carcinoma with Lymphovascular Invasion: ER/PR 100%, Ki67 50%  . Breast lumpectomy 07/05/2011    Right Breast- Invasive Ductal , Grade III, ducatal Carrcinoma  In-situ; 0/1 Node Negative    REVIEW OF SYSTEMS:  Pertinent items are noted in HPI.   PHYSICAL EXAMINATION: General appearance: alert, cooperative and appears stated age Lymph nodes: Cervical, supraclavicular, and axillary nodes normal. Resp: clear to auscultation bilaterally and normal percussion bilaterally Cardio: regular rate and rhythm, S1, S2 normal, no murmur, click, rub or gallop GI: soft, non-tender; bowel sounds normal; no masses,  no organomegaly Extremities: extremities normal, atraumatic, no cyanosis or edema Neurologic: Grossly normal Right breast exam reveals a healing incisional scar as does the axilla there is no evidence of infections. No nipple discharge. Left breast no masses or nipple discharge.  ECOG PERFORMANCE STATUS: 0 - Asymptomatic  Blood pressure 120/79, pulse 67, temperature 98.7 F (37.1 C), temperature source Oral, height 5\' 3"  (1.6 m), weight 187 lb 14.4 oz (85.231 kg), last menstrual period 03/13/1990.  LABORATORY DATA: Lab Results  Component Value Date   WBC 6.8 08/21/2011   HGB 14.7 08/21/2011   HCT 43.7 08/21/2011   MCV 91.8 08/21/2011   PLT 183 08/21/2011      Chemistry      Component Value Date/Time   NA 140 07/03/2011 1330   K 4.0 07/03/2011 1330   CL 103 07/03/2011 1330   CO2 26 07/03/2011 1330   BUN 14 07/03/2011 1330   CREATININE 0.76 07/03/2011 1330      Component Value Date/Time   CALCIUM 9.7 07/03/2011 1330   ALKPHOS 79 06/28/2011 0809   AST 33 06/28/2011 0809   ALT 43* 06/28/2011 0809   BILITOT 0.4 06/28/2011 0809     Patient Name: Joy Gonzales Accession #: [id number] DOB: 1941-01-23 Age: 35 Gender: F Client Name [facility] Collected Date:  07/05/2011 Received Date: 07/05/2011 Physician: Joy Gonzales Chart #: MRN # : [id number] Physician cc: Joy Lim, RN Christiana Pellant, MD Race:W Visit #: [id number] Joy Gunnels, MD REPORT OF SURGICAL PATHOLOGY ADDITIONAL INFORMATION: 1. A sample (Block 1A) was sent to [facility] for Oncotype testing. The patient's recurrence score is 9. Those patients with a recurrence score of 9 had an average rate of distant recurrence of 6%. (JK:mw 08-01-11) Pecola Leisure MD Pathologist, Electronic Signature ( Signed 08/01/2011) 1. CHROMOGENIC IN-SITU HYBRIDIZATION Interpretation HER-2/NEU BY CISH - NO AMPLIFICATION OF HER-2 DETECTED. THE RATIO OF HER-2: CEP 17 SIGNALS  WAS 1.21. Reference range: Ratio: HER2:CEP17 < 1.8 - gene amplification not observed Ratio: HER2:CEP 17 1.8-2.2 - equivocal result Ratio: HER2:CEP17 > 2.2 - gene amplification observed Pecola Leisure MD Pathologist, Electronic Signature ( Signed 07/11/2011) FINAL DIAGNOSIS 1 of 3 FINAL for Barnick, Ronniesha C ([id number]) Diagnosis 1. Breast, lumpectomy, Right - INVASIVE DUCTAL CARCINOMA, GRADE III (1.7 CM). - INVASIVE TUMOR IS 0.1MM FROM NEAREST MARGIN (ANTERIOR) - NO LYMPHOVASCULAR INVASION IDENTIFIED. - DUCTAL CARCINOMA IN SITU. - SEE TUMOR SYNOPTIC TEMPLATE BELOW. 2. Lymph node, sentinel, biopsy, Right - ONE LYMPH NODE, NEGATIVE FOR TUMOR (0/1) SEE COMMENT Microscopic Comment 1. BREAST, INVASIVE TUMOR, WITH LYMPH NODE SAMPLING Specimen, including laterality: Right breast. Procedure: Lumpectomy. Grade: III of III Tubule formation: 2 Nuclear pleomorphism: 3 Mitotic:2 Tumor size (gross measurement): 1.7 cm Margins: Invasive, distance to closest margin: 0.42mm In-situ, distance to closest margin: 0.7cm If margin positive, focally or broadly: N/A Lymphovascular invasion: Absent Ductal carcinoma in situ: Present. Grade: II-III Extensive intraductal component: Absent. Lobular neoplasia: Absent. Tumor focality:  Unifocal Treatment effect: None. If present, treatment effect in breast tissue, lymph nodes or both: None. Extent of tumor: Skin: N/A Nipple: N/A Skeletal muscle: N/A Lymph nodes: # examined: 1 Lymph nodes with metastasis: 0 Extracapsular extension: n/a Breast prognostic profile: Estrogen receptor: Not repeated, previous study demonstrated 100% positivity ([id number]) Progesterone receptor: Not repeated, previous study demonstrated 100% positivity ([id number]) Her 2 neu: Repeated, previous study demonstrated no amplification (1.21), ([id number]) Ki-67: Not repeated, previous study demonstrated 50% proliferation rate ([id number]). Non-neoplastic breast: Fibrocystic change and fibrosis. TNM: pT1c, pN0, pMX (CRR:gt, 07/07/11) 2. Cytokeratin Ae1/3 immunostain does not demonstrate any intranodal metastatic epithelial tumor deposits. There are hyalinized and calcified intra-nodal nodules present without necrosis. GMS stain is negative for yeast and fungi. [place] RUND DO Pathologist, Electronic Signature  RADIOGRAPHIC STUDIES:  No results found.  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 Tumor was ER +100% PR +100% HER-2/neu negative with Ki-67 of 50%.  #2 Oncotype testing revealed a distant recurrence of only 6% dose patient is proceeding with radiation therapy.  #3 once patient completes radiation we will start her on antiestrogen therapy adjuvantly. Risks and benefits of this has been discussed with her. The rationale also has been discussed with her.   PLAN:   #1 patient will proceed with her radiation.  #2 I will plan on seeing her back in 2 months time in follow p. However I certainly can see her sooner if need arises.   All questions were answered. The patient knows to call the clinic with any  problems, questions or concerns. We can certainly see the patient much sooner if necessary.  I spent 25 minutes counseling the patient face to face. The total time spent in the appointment was 30 minutes.

## 2011-08-21 NOTE — Telephone Encounter (Signed)
gve the pt her aug 2013 appt calendar 

## 2011-08-21 NOTE — Patient Instructions (Signed)
1. Proceed with radiation therapy as schedueld by Dr. Basilio Cairo  2. Recommend taking vitamin D3  3. We discussed your Oncotype Dx score. You will not need chemotherapy but after completion of radiation we will begin anti-estrogen therapy with Arimidex. We discussed the side effects and benefits. More information as below.  4. I will plan to see you back in August.

## 2011-08-23 ENCOUNTER — Encounter: Payer: Self-pay | Admitting: Radiation Oncology

## 2011-08-23 ENCOUNTER — Ambulatory Visit
Admission: RE | Admit: 2011-08-23 | Discharge: 2011-08-23 | Disposition: A | Discharge status: Home / Self Care | Payer: Medicare Other | Source: Ambulatory Visit | Attending: Radiation Oncology | Admitting: Radiation Oncology

## 2011-08-23 DIAGNOSIS — C50419 Malignant neoplasm of upper-outer quadrant of unspecified female breast: Secondary | ICD-10-CM | POA: Diagnosis not present

## 2011-08-23 DIAGNOSIS — Z79899 Other long term (current) drug therapy: Secondary | ICD-10-CM | POA: Diagnosis not present

## 2011-08-23 DIAGNOSIS — Z510 Encounter for antineoplastic radiation therapy: Secondary | ICD-10-CM | POA: Diagnosis not present

## 2011-08-23 NOTE — Progress Notes (Signed)
VERIFICATION SIMULATION NOTE  The patient was laid in the correct position on the treatment table for simulation verification. Portal imaging was obtained and I verified the fields and MLCs to be accurate for her breast fields. The patient tolerated the procedure well.  -----------------------------------------------------  Lonie Peak, MD

## 2011-08-24 ENCOUNTER — Ambulatory Visit
Admission: RE | Admit: 2011-08-24 | Discharge: 2011-08-24 | Disposition: A | Discharge status: Home / Self Care | Payer: Medicare Other | Source: Ambulatory Visit | Attending: Radiation Oncology | Admitting: Radiation Oncology

## 2011-08-24 DIAGNOSIS — Z510 Encounter for antineoplastic radiation therapy: Secondary | ICD-10-CM | POA: Diagnosis not present

## 2011-08-24 DIAGNOSIS — C50419 Malignant neoplasm of upper-outer quadrant of unspecified female breast: Secondary | ICD-10-CM | POA: Diagnosis not present

## 2011-08-24 DIAGNOSIS — Z79899 Other long term (current) drug therapy: Secondary | ICD-10-CM | POA: Diagnosis not present

## 2011-08-25 ENCOUNTER — Ambulatory Visit
Admission: RE | Admit: 2011-08-25 | Discharge: 2011-08-25 | Disposition: A | Discharge status: Home / Self Care | Payer: Medicare Other | Source: Ambulatory Visit | Attending: Radiation Oncology | Admitting: Radiation Oncology

## 2011-08-25 ENCOUNTER — Ambulatory Visit: Payer: Medicare Other | Admitting: Oncology

## 2011-08-25 DIAGNOSIS — Z510 Encounter for antineoplastic radiation therapy: Secondary | ICD-10-CM | POA: Diagnosis not present

## 2011-08-25 DIAGNOSIS — Z79899 Other long term (current) drug therapy: Secondary | ICD-10-CM | POA: Diagnosis not present

## 2011-08-25 DIAGNOSIS — C50419 Malignant neoplasm of upper-outer quadrant of unspecified female breast: Secondary | ICD-10-CM | POA: Diagnosis not present

## 2011-08-28 ENCOUNTER — Ambulatory Visit
Admission: RE | Admit: 2011-08-28 | Discharge: 2011-08-28 | Disposition: A | Discharge status: Home / Self Care | Payer: Medicare Other | Source: Ambulatory Visit | Attending: Radiation Oncology | Admitting: Radiation Oncology

## 2011-08-28 DIAGNOSIS — Z79899 Other long term (current) drug therapy: Secondary | ICD-10-CM | POA: Diagnosis not present

## 2011-08-28 DIAGNOSIS — C50419 Malignant neoplasm of upper-outer quadrant of unspecified female breast: Secondary | ICD-10-CM | POA: Diagnosis not present

## 2011-08-28 DIAGNOSIS — Z510 Encounter for antineoplastic radiation therapy: Secondary | ICD-10-CM | POA: Diagnosis not present

## 2011-08-29 ENCOUNTER — Ambulatory Visit
Admission: RE | Admit: 2011-08-29 | Discharge: 2011-08-29 | Disposition: A | Discharge status: Home / Self Care | Payer: Medicare Other | Source: Ambulatory Visit | Attending: Radiation Oncology | Admitting: Radiation Oncology

## 2011-08-29 ENCOUNTER — Encounter: Payer: Self-pay | Admitting: Radiation Oncology

## 2011-08-29 VITALS — BP 125/72 | HR 58 | Temp 98.2°F | Resp 20 | Wt 186.9 lb

## 2011-08-29 DIAGNOSIS — C50419 Malignant neoplasm of upper-outer quadrant of unspecified female breast: Secondary | ICD-10-CM | POA: Diagnosis not present

## 2011-08-29 DIAGNOSIS — Z79899 Other long term (current) drug therapy: Secondary | ICD-10-CM | POA: Diagnosis not present

## 2011-08-29 DIAGNOSIS — Z510 Encounter for antineoplastic radiation therapy: Secondary | ICD-10-CM | POA: Diagnosis not present

## 2011-08-29 MED ORDER — ALRA NON-METALLIC DEODORANT (RAD-ONC)
1.0000 "application " | Freq: Once | TOPICAL | Status: AC
  Administered 2011-08-29: 1 via TOPICAL

## 2011-08-29 MED ORDER — RADIAPLEXRX EX GEL
Freq: Once | CUTANEOUS | Status: AC
  Administered 2011-08-29: 10:00:00 via TOPICAL

## 2011-08-29 NOTE — Progress Notes (Signed)
   Weekly Management Note: T1cN0M0 right breast cancer Current Dose:  800 cGy  Projected Dose: 6000 cGy   Narrative:  The patient presents for routine under treatment assessment.  CBCT/MVCT images/Port film x-rays were reviewed.  The chart was checked.  Physical Findings:  weight is 186 lb 14.4 oz (84.777 kg). Her oral temperature is 98.2 F (36.8 C). Her blood pressure is 125/72 and her pulse is 58. Her respiration is 20.  No skin changes thus far over right breast  Impression:  The patient is tolerating radiotherapy.  Plan:  Continue radiotherapy as planned.  ________________________________   Lonie Peak, M.D.

## 2011-08-29 NOTE — Progress Notes (Signed)
Patient,alert,oriented x3, post sim teaching done, radiation therapy and you given with alra deod, radioplex gel, and flyer skin products ,sees MD weekly/prn, patient denies pain or discomfort ,patient gave verbal teach back , slight erythema 10:11 AM

## 2011-08-30 ENCOUNTER — Ambulatory Visit
Admission: RE | Admit: 2011-08-30 | Discharge: 2011-08-30 | Disposition: A | Discharge status: Home / Self Care | Payer: Medicare Other | Source: Ambulatory Visit | Attending: Radiation Oncology | Admitting: Radiation Oncology

## 2011-08-30 DIAGNOSIS — C50419 Malignant neoplasm of upper-outer quadrant of unspecified female breast: Secondary | ICD-10-CM | POA: Diagnosis not present

## 2011-08-30 DIAGNOSIS — Z510 Encounter for antineoplastic radiation therapy: Secondary | ICD-10-CM | POA: Diagnosis not present

## 2011-08-30 DIAGNOSIS — Z79899 Other long term (current) drug therapy: Secondary | ICD-10-CM | POA: Diagnosis not present

## 2011-08-31 ENCOUNTER — Ambulatory Visit
Admission: RE | Admit: 2011-08-31 | Discharge: 2011-08-31 | Disposition: A | Discharge status: Home / Self Care | Payer: Medicare Other | Source: Ambulatory Visit | Attending: Radiation Oncology | Admitting: Radiation Oncology

## 2011-08-31 DIAGNOSIS — Z510 Encounter for antineoplastic radiation therapy: Secondary | ICD-10-CM | POA: Diagnosis not present

## 2011-08-31 DIAGNOSIS — C50419 Malignant neoplasm of upper-outer quadrant of unspecified female breast: Secondary | ICD-10-CM | POA: Diagnosis not present

## 2011-08-31 DIAGNOSIS — Z79899 Other long term (current) drug therapy: Secondary | ICD-10-CM | POA: Diagnosis not present

## 2011-09-01 ENCOUNTER — Ambulatory Visit
Admission: RE | Admit: 2011-09-01 | Discharge: 2011-09-01 | Disposition: A | Discharge status: Home / Self Care | Payer: Medicare Other | Source: Ambulatory Visit | Attending: Radiation Oncology | Admitting: Radiation Oncology

## 2011-09-01 DIAGNOSIS — C50419 Malignant neoplasm of upper-outer quadrant of unspecified female breast: Secondary | ICD-10-CM | POA: Diagnosis not present

## 2011-09-01 DIAGNOSIS — Z79899 Other long term (current) drug therapy: Secondary | ICD-10-CM | POA: Diagnosis not present

## 2011-09-01 DIAGNOSIS — Z510 Encounter for antineoplastic radiation therapy: Secondary | ICD-10-CM | POA: Diagnosis not present

## 2011-09-04 ENCOUNTER — Encounter: Payer: Self-pay | Admitting: Radiation Oncology

## 2011-09-04 ENCOUNTER — Ambulatory Visit
Admission: RE | Admit: 2011-09-04 | Discharge: 2011-09-04 | Disposition: A | Discharge status: Home / Self Care | Payer: Medicare Other | Source: Ambulatory Visit | Attending: Radiation Oncology | Admitting: Radiation Oncology

## 2011-09-04 VITALS — BP 110/72 | HR 63 | Temp 97.3°F | Resp 20 | Wt 188.3 lb

## 2011-09-04 DIAGNOSIS — C50419 Malignant neoplasm of upper-outer quadrant of unspecified female breast: Secondary | ICD-10-CM

## 2011-09-04 DIAGNOSIS — Z79899 Other long term (current) drug therapy: Secondary | ICD-10-CM | POA: Diagnosis not present

## 2011-09-04 DIAGNOSIS — Z510 Encounter for antineoplastic radiation therapy: Secondary | ICD-10-CM | POA: Diagnosis not present

## 2011-09-04 NOTE — Progress Notes (Signed)
   Weekly Management Note: T1 CN 0 M0 right breast cancer Current Dose:  1600 cGy  Projected Dose: 6000 cGy   Narrative:  The patient presents for routine under treatment assessment.  CBCT/MVCT images/Port film x-rays were reviewed.  The chart was checked. Doing well with no complaints.  Physical Findings:  weight is 188 lb 4.8 oz (85.412 kg). Her oral temperature is 97.3 F (36.3 C). Her blood pressure is 110/72 and her pulse is 63. Her respiration is 20.  very early erythema over the right breast.  Impression:  The patient is tolerating radiotherapy.  Plan:  Continue radiotherapy as planned.  ________________________________   Lonie Peak, M.D.

## 2011-09-04 NOTE — Progress Notes (Signed)
Pt doing well, denies pain, fatigue, loss of appetite. Applying Radiaplex to right breast tx area.

## 2011-09-05 ENCOUNTER — Ambulatory Visit
Admission: RE | Admit: 2011-09-05 | Discharge: 2011-09-05 | Disposition: A | Discharge status: Home / Self Care | Payer: Medicare Other | Source: Ambulatory Visit | Attending: Radiation Oncology | Admitting: Radiation Oncology

## 2011-09-05 DIAGNOSIS — Z510 Encounter for antineoplastic radiation therapy: Secondary | ICD-10-CM | POA: Diagnosis not present

## 2011-09-05 DIAGNOSIS — C50419 Malignant neoplasm of upper-outer quadrant of unspecified female breast: Secondary | ICD-10-CM | POA: Diagnosis not present

## 2011-09-05 DIAGNOSIS — Z79899 Other long term (current) drug therapy: Secondary | ICD-10-CM | POA: Diagnosis not present

## 2011-09-06 ENCOUNTER — Ambulatory Visit
Admission: RE | Admit: 2011-09-06 | Discharge: 2011-09-06 | Disposition: A | Discharge status: Home / Self Care | Payer: Medicare Other | Source: Ambulatory Visit | Attending: Radiation Oncology | Admitting: Radiation Oncology

## 2011-09-06 DIAGNOSIS — Z510 Encounter for antineoplastic radiation therapy: Secondary | ICD-10-CM | POA: Diagnosis not present

## 2011-09-06 DIAGNOSIS — Z79899 Other long term (current) drug therapy: Secondary | ICD-10-CM | POA: Diagnosis not present

## 2011-09-06 DIAGNOSIS — C50419 Malignant neoplasm of upper-outer quadrant of unspecified female breast: Secondary | ICD-10-CM | POA: Diagnosis not present

## 2011-09-07 ENCOUNTER — Ambulatory Visit
Admission: RE | Admit: 2011-09-07 | Discharge: 2011-09-07 | Disposition: A | Discharge status: Home / Self Care | Payer: Medicare Other | Source: Ambulatory Visit | Attending: Radiation Oncology | Admitting: Radiation Oncology

## 2011-09-07 DIAGNOSIS — Z510 Encounter for antineoplastic radiation therapy: Secondary | ICD-10-CM | POA: Diagnosis not present

## 2011-09-07 DIAGNOSIS — Z79899 Other long term (current) drug therapy: Secondary | ICD-10-CM | POA: Diagnosis not present

## 2011-09-07 DIAGNOSIS — C50419 Malignant neoplasm of upper-outer quadrant of unspecified female breast: Secondary | ICD-10-CM | POA: Diagnosis not present

## 2011-09-08 ENCOUNTER — Ambulatory Visit
Admission: RE | Admit: 2011-09-08 | Discharge: 2011-09-08 | Disposition: A | Discharge status: Home / Self Care | Payer: Medicare Other | Source: Ambulatory Visit | Attending: Radiation Oncology | Admitting: Radiation Oncology

## 2011-09-08 DIAGNOSIS — Z510 Encounter for antineoplastic radiation therapy: Secondary | ICD-10-CM | POA: Diagnosis not present

## 2011-09-08 DIAGNOSIS — Z79899 Other long term (current) drug therapy: Secondary | ICD-10-CM | POA: Diagnosis not present

## 2011-09-08 DIAGNOSIS — C50419 Malignant neoplasm of upper-outer quadrant of unspecified female breast: Secondary | ICD-10-CM | POA: Diagnosis not present

## 2011-09-11 ENCOUNTER — Ambulatory Visit
Admission: RE | Admit: 2011-09-11 | Discharge: 2011-09-11 | Disposition: A | Discharge status: Home / Self Care | Payer: Medicare Other | Source: Ambulatory Visit | Attending: Radiation Oncology | Admitting: Radiation Oncology

## 2011-09-11 ENCOUNTER — Encounter: Payer: Self-pay | Admitting: Radiation Oncology

## 2011-09-11 VITALS — BP 125/71 | HR 63 | Temp 97.8°F | Wt 188.2 lb

## 2011-09-11 DIAGNOSIS — C50419 Malignant neoplasm of upper-outer quadrant of unspecified female breast: Secondary | ICD-10-CM | POA: Diagnosis not present

## 2011-09-11 DIAGNOSIS — Z79899 Other long term (current) drug therapy: Secondary | ICD-10-CM | POA: Diagnosis not present

## 2011-09-11 DIAGNOSIS — Z510 Encounter for antineoplastic radiation therapy: Secondary | ICD-10-CM | POA: Diagnosis not present

## 2011-09-11 NOTE — Progress Notes (Signed)
   Weekly Management Note, T1 CN 0 M0 right breast cancer Current Dose: 2600  cGy  Projected Dose: 6000 cGy   Narrative:  The patient presents for routine under treatment assessment.  CBCT/MVCT images/Port film x-rays were reviewed.  The chart was checked. She is doing well. Has had a cough chronically, not changed since starting RT, but it has somewhat increased since the beginning of the year however. It is dry. History of GERD. No fevers or new shortness of breath.  Physical Findings:  weight is 188 lb 3.2 oz (85.367 kg). Her temperature is 97.8 F (36.6 C). Her blood pressure is 125/71 and her pulse is 63.   erythema notable over the right breast. Chest clear to auscultation bilaterally  Impression:  The patient is tolerating radiotherapy.  Plan:  Continue radiotherapy as planned. Reviewed the patient's imaging with her. No recent chest x-rays. Reassured the patient that this is not related to her radiotherapy. Suggested she talk to her primary Dr. about her increased cough. ________________________________   Lonie Peak, M.D.

## 2011-09-11 NOTE — Progress Notes (Signed)
Reports increase in coughing since starting radiation even though this cough started prior to treatment.   Redness noted on right breast surrounding the areola.  The inframmary fold without redness and skin intact.  No complaints of pain.  Reports more sluggishness this weekend but "I feel pretty good today".

## 2011-09-12 ENCOUNTER — Ambulatory Visit
Admission: RE | Admit: 2011-09-12 | Discharge: 2011-09-12 | Disposition: A | Discharge status: Home / Self Care | Payer: Medicare Other | Source: Ambulatory Visit | Attending: Radiation Oncology | Admitting: Radiation Oncology

## 2011-09-12 DIAGNOSIS — Z79899 Other long term (current) drug therapy: Secondary | ICD-10-CM | POA: Diagnosis not present

## 2011-09-12 DIAGNOSIS — C50419 Malignant neoplasm of upper-outer quadrant of unspecified female breast: Secondary | ICD-10-CM | POA: Diagnosis not present

## 2011-09-12 DIAGNOSIS — Z8551 Personal history of malignant neoplasm of bladder: Secondary | ICD-10-CM | POA: Diagnosis not present

## 2011-09-12 DIAGNOSIS — N281 Cyst of kidney, acquired: Secondary | ICD-10-CM | POA: Diagnosis not present

## 2011-09-12 DIAGNOSIS — N393 Stress incontinence (female) (male): Secondary | ICD-10-CM | POA: Diagnosis not present

## 2011-09-12 DIAGNOSIS — Z510 Encounter for antineoplastic radiation therapy: Secondary | ICD-10-CM | POA: Diagnosis not present

## 2011-09-13 ENCOUNTER — Ambulatory Visit
Admission: RE | Admit: 2011-09-13 | Discharge: 2011-09-13 | Disposition: A | Discharge status: Home / Self Care | Payer: Medicare Other | Source: Ambulatory Visit | Attending: Radiation Oncology | Admitting: Radiation Oncology

## 2011-09-13 DIAGNOSIS — Z510 Encounter for antineoplastic radiation therapy: Secondary | ICD-10-CM | POA: Diagnosis not present

## 2011-09-13 DIAGNOSIS — C50419 Malignant neoplasm of upper-outer quadrant of unspecified female breast: Secondary | ICD-10-CM | POA: Diagnosis not present

## 2011-09-13 DIAGNOSIS — Z79899 Other long term (current) drug therapy: Secondary | ICD-10-CM | POA: Diagnosis not present

## 2011-09-15 ENCOUNTER — Ambulatory Visit
Admission: RE | Admit: 2011-09-15 | Discharge: 2011-09-15 | Disposition: A | Discharge status: Home / Self Care | Payer: Medicare Other | Source: Ambulatory Visit | Attending: Radiation Oncology | Admitting: Radiation Oncology

## 2011-09-15 DIAGNOSIS — C50419 Malignant neoplasm of upper-outer quadrant of unspecified female breast: Secondary | ICD-10-CM | POA: Diagnosis not present

## 2011-09-15 DIAGNOSIS — Z79899 Other long term (current) drug therapy: Secondary | ICD-10-CM | POA: Diagnosis not present

## 2011-09-15 DIAGNOSIS — Z510 Encounter for antineoplastic radiation therapy: Secondary | ICD-10-CM | POA: Diagnosis not present

## 2011-09-18 ENCOUNTER — Encounter: Payer: Self-pay | Admitting: Radiation Oncology

## 2011-09-18 ENCOUNTER — Ambulatory Visit
Admission: RE | Admit: 2011-09-18 | Discharge: 2011-09-18 | Disposition: A | Discharge status: Home / Self Care | Payer: Medicare Other | Source: Ambulatory Visit | Attending: Radiation Oncology | Admitting: Radiation Oncology

## 2011-09-18 VITALS — BP 105/69 | HR 62 | Temp 98.6°F | Resp 20 | Wt 187.6 lb

## 2011-09-18 DIAGNOSIS — C50419 Malignant neoplasm of upper-outer quadrant of unspecified female breast: Secondary | ICD-10-CM | POA: Diagnosis not present

## 2011-09-18 DIAGNOSIS — Z510 Encounter for antineoplastic radiation therapy: Secondary | ICD-10-CM | POA: Diagnosis not present

## 2011-09-18 DIAGNOSIS — Z79899 Other long term (current) drug therapy: Secondary | ICD-10-CM | POA: Diagnosis not present

## 2011-09-18 NOTE — Progress Notes (Signed)
   Weekly Management Note T1 CN 0 M0 right breast cancer Current Dose:   3400 cGy  Projected Dose:  6000 cGy   Narrative:  The patient presents for routine under treatment assessment.  CBCT/MVCT images/Port film x-rays were reviewed.  The chart was checked. No complaints.  Physical Findings:  weight is 187 lb 9.6 oz (85.095 kg). Her oral temperature is 98.6 F (37 C). Her blood pressure is 105/69 and her pulse is 62. Her respiration is 20.  erythema throughout the right breast with early radiation dermatitis in the upper inner quadrant. Skin  intact.  Impression:  The patient is tolerating radiotherapy.  Plan:  Continue radiotherapy as planned. Continue radiaplex -recommended doing this 3 times a day. I talked to her about using 1% hydrocortisone cream if the area of radiation dermatitis becomes itchy ________________________________   Lonie Peak, M.D.

## 2011-09-18 NOTE — Progress Notes (Signed)
Pt denies pain, fatigue, loss of appetite. Applying Radiaplex to right breast tx area bid.

## 2011-09-19 ENCOUNTER — Ambulatory Visit
Admission: RE | Admit: 2011-09-19 | Discharge: 2011-09-19 | Disposition: A | Discharge status: Home / Self Care | Payer: Medicare Other | Source: Ambulatory Visit | Attending: Radiation Oncology | Admitting: Radiation Oncology

## 2011-09-19 DIAGNOSIS — Z79899 Other long term (current) drug therapy: Secondary | ICD-10-CM | POA: Diagnosis not present

## 2011-09-19 DIAGNOSIS — Z510 Encounter for antineoplastic radiation therapy: Secondary | ICD-10-CM | POA: Diagnosis not present

## 2011-09-19 DIAGNOSIS — C50419 Malignant neoplasm of upper-outer quadrant of unspecified female breast: Secondary | ICD-10-CM | POA: Diagnosis not present

## 2011-09-20 ENCOUNTER — Ambulatory Visit
Admission: RE | Admit: 2011-09-20 | Discharge: 2011-09-20 | Disposition: A | Discharge status: Home / Self Care | Payer: Medicare Other | Source: Ambulatory Visit | Attending: Radiation Oncology | Admitting: Radiation Oncology

## 2011-09-20 DIAGNOSIS — C50419 Malignant neoplasm of upper-outer quadrant of unspecified female breast: Secondary | ICD-10-CM | POA: Diagnosis not present

## 2011-09-20 DIAGNOSIS — Z510 Encounter for antineoplastic radiation therapy: Secondary | ICD-10-CM | POA: Diagnosis not present

## 2011-09-20 DIAGNOSIS — Z79899 Other long term (current) drug therapy: Secondary | ICD-10-CM | POA: Diagnosis not present

## 2011-09-21 ENCOUNTER — Ambulatory Visit
Admission: RE | Admit: 2011-09-21 | Discharge: 2011-09-21 | Disposition: A | Discharge status: Home / Self Care | Payer: Medicare Other | Source: Ambulatory Visit | Attending: Radiation Oncology | Admitting: Radiation Oncology

## 2011-09-21 DIAGNOSIS — C50419 Malignant neoplasm of upper-outer quadrant of unspecified female breast: Secondary | ICD-10-CM | POA: Diagnosis not present

## 2011-09-21 DIAGNOSIS — Z79899 Other long term (current) drug therapy: Secondary | ICD-10-CM | POA: Diagnosis not present

## 2011-09-21 DIAGNOSIS — Z510 Encounter for antineoplastic radiation therapy: Secondary | ICD-10-CM | POA: Diagnosis not present

## 2011-09-22 ENCOUNTER — Ambulatory Visit
Admission: RE | Admit: 2011-09-22 | Discharge: 2011-09-22 | Disposition: A | Discharge status: Home / Self Care | Payer: Medicare Other | Source: Ambulatory Visit | Attending: Radiation Oncology | Admitting: Radiation Oncology

## 2011-09-22 ENCOUNTER — Encounter: Payer: Self-pay | Admitting: Radiation Oncology

## 2011-09-22 DIAGNOSIS — C50419 Malignant neoplasm of upper-outer quadrant of unspecified female breast: Secondary | ICD-10-CM | POA: Diagnosis not present

## 2011-09-22 DIAGNOSIS — Z510 Encounter for antineoplastic radiation therapy: Secondary | ICD-10-CM | POA: Diagnosis not present

## 2011-09-22 DIAGNOSIS — Z79899 Other long term (current) drug therapy: Secondary | ICD-10-CM | POA: Diagnosis not present

## 2011-09-25 ENCOUNTER — Ambulatory Visit
Admission: RE | Admit: 2011-09-25 | Discharge: 2011-09-25 | Disposition: A | Discharge status: Home / Self Care | Payer: Medicare Other | Source: Ambulatory Visit | Attending: Radiation Oncology | Admitting: Radiation Oncology

## 2011-09-25 ENCOUNTER — Encounter: Payer: Self-pay | Admitting: Radiation Oncology

## 2011-09-25 VITALS — BP 115/71 | HR 67 | Temp 97.9°F | Resp 20 | Wt 188.3 lb

## 2011-09-25 DIAGNOSIS — C50419 Malignant neoplasm of upper-outer quadrant of unspecified female breast: Secondary | ICD-10-CM

## 2011-09-25 DIAGNOSIS — Z79899 Other long term (current) drug therapy: Secondary | ICD-10-CM | POA: Diagnosis not present

## 2011-09-25 DIAGNOSIS — Z510 Encounter for antineoplastic radiation therapy: Secondary | ICD-10-CM | POA: Diagnosis not present

## 2011-09-25 NOTE — Progress Notes (Signed)
Patient arrived alert/oriented x3, no c/o pain, completed 22/30 right breast rad txs,t=97.9,b/p=115/71,p=67,rr=-20, right breast skin  With erythema and dermatitis on chect and under breast fold, switched to biafine  Cream, c/o itching, and almost out of radiaplex gel, use the same as radiaplex,  Tid, patient gave teach back of use of biafine, pateint still has knt below nipple size of golf ball,no pain, 9:18 AM

## 2011-09-25 NOTE — Progress Notes (Signed)
   Weekly Management Note, right breast Current Dose:   44Gy  Projected Dose:  60Gy   Narrative:  The patient presents for routine under treatment assessment.  CBCT/MVCT images/Port film x-rays were reviewed.  The chart was checked.doing well. Noting some more erythema over her skin. notes some swelling underneath her nipple.  Physical Findings:  weight is 188 lb 4.8 oz (85.412 kg). Her temperature is 97.9 F (36.6 C). Her blood pressure is 115/71 and her pulse is 67. Her respiration is 20. diffuse erythema throughout the right breast. Skin is intact. There is firmness in the retroareolar region, consistent with postsurgical edema/swelling compounded by inflammatory effects of radiotherapy. Nipple is slightly inverted. No nipple discharge.  Impression:  The patient is tolerating radiotherapy.  Plan:  Continue radiotherapy as planned.patient given a tube of Biafine for her skin. informed the patient that I will be out of town next week, so my colleague will see her next Monday. card given for a one-month followup thereafter. ________________________________   Lonie Peak, M.D.

## 2011-09-26 ENCOUNTER — Ambulatory Visit
Admission: RE | Admit: 2011-09-26 | Discharge: 2011-09-26 | Disposition: A | Discharge status: Home / Self Care | Payer: Medicare Other | Source: Ambulatory Visit | Attending: Radiation Oncology | Admitting: Radiation Oncology

## 2011-09-26 DIAGNOSIS — Z79899 Other long term (current) drug therapy: Secondary | ICD-10-CM | POA: Diagnosis not present

## 2011-09-26 DIAGNOSIS — C50419 Malignant neoplasm of upper-outer quadrant of unspecified female breast: Secondary | ICD-10-CM | POA: Diagnosis not present

## 2011-09-26 DIAGNOSIS — Z510 Encounter for antineoplastic radiation therapy: Secondary | ICD-10-CM | POA: Diagnosis not present

## 2011-09-27 ENCOUNTER — Ambulatory Visit
Admission: RE | Admit: 2011-09-27 | Discharge: 2011-09-27 | Disposition: A | Discharge status: Home / Self Care | Payer: Medicare Other | Source: Ambulatory Visit | Attending: Radiation Oncology | Admitting: Radiation Oncology

## 2011-09-27 DIAGNOSIS — C50419 Malignant neoplasm of upper-outer quadrant of unspecified female breast: Secondary | ICD-10-CM | POA: Diagnosis not present

## 2011-09-27 DIAGNOSIS — Z510 Encounter for antineoplastic radiation therapy: Secondary | ICD-10-CM | POA: Diagnosis not present

## 2011-09-27 DIAGNOSIS — Z79899 Other long term (current) drug therapy: Secondary | ICD-10-CM | POA: Diagnosis not present

## 2011-09-28 ENCOUNTER — Ambulatory Visit
Admission: RE | Admit: 2011-09-28 | Discharge: 2011-09-28 | Disposition: A | Discharge status: Home / Self Care | Payer: Medicare Other | Source: Ambulatory Visit | Attending: Radiation Oncology | Admitting: Radiation Oncology

## 2011-09-28 DIAGNOSIS — Z79899 Other long term (current) drug therapy: Secondary | ICD-10-CM | POA: Diagnosis not present

## 2011-09-28 DIAGNOSIS — Z510 Encounter for antineoplastic radiation therapy: Secondary | ICD-10-CM | POA: Diagnosis not present

## 2011-09-28 DIAGNOSIS — C50419 Malignant neoplasm of upper-outer quadrant of unspecified female breast: Secondary | ICD-10-CM | POA: Diagnosis not present

## 2011-09-29 ENCOUNTER — Ambulatory Visit
Admission: RE | Admit: 2011-09-29 | Discharge: 2011-09-29 | Disposition: A | Discharge status: Home / Self Care | Payer: Medicare Other | Source: Ambulatory Visit | Attending: Radiation Oncology | Admitting: Radiation Oncology

## 2011-09-29 DIAGNOSIS — C50419 Malignant neoplasm of upper-outer quadrant of unspecified female breast: Secondary | ICD-10-CM | POA: Diagnosis not present

## 2011-09-29 DIAGNOSIS — Z510 Encounter for antineoplastic radiation therapy: Secondary | ICD-10-CM | POA: Diagnosis not present

## 2011-09-29 DIAGNOSIS — Z79899 Other long term (current) drug therapy: Secondary | ICD-10-CM | POA: Diagnosis not present

## 2011-10-02 ENCOUNTER — Ambulatory Visit
Admission: RE | Admit: 2011-10-02 | Discharge: 2011-10-02 | Disposition: A | Discharge status: Home / Self Care | Payer: Medicare Other | Source: Ambulatory Visit | Attending: Radiation Oncology | Admitting: Radiation Oncology

## 2011-10-02 DIAGNOSIS — Z510 Encounter for antineoplastic radiation therapy: Secondary | ICD-10-CM | POA: Diagnosis not present

## 2011-10-02 DIAGNOSIS — C50419 Malignant neoplasm of upper-outer quadrant of unspecified female breast: Secondary | ICD-10-CM | POA: Diagnosis not present

## 2011-10-02 DIAGNOSIS — Z79899 Other long term (current) drug therapy: Secondary | ICD-10-CM | POA: Diagnosis not present

## 2011-10-03 ENCOUNTER — Ambulatory Visit
Admission: RE | Admit: 2011-10-03 | Discharge: 2011-10-03 | Disposition: A | Discharge status: Home / Self Care | Payer: Medicare Other | Source: Ambulatory Visit | Attending: Radiation Oncology | Admitting: Radiation Oncology

## 2011-10-03 VITALS — BP 116/71 | HR 58 | Temp 97.9°F | Resp 20 | Wt 188.5 lb

## 2011-10-03 DIAGNOSIS — C50419 Malignant neoplasm of upper-outer quadrant of unspecified female breast: Secondary | ICD-10-CM

## 2011-10-03 DIAGNOSIS — Z79899 Other long term (current) drug therapy: Secondary | ICD-10-CM | POA: Diagnosis not present

## 2011-10-03 DIAGNOSIS — Z510 Encounter for antineoplastic radiation therapy: Secondary | ICD-10-CM | POA: Diagnosis not present

## 2011-10-03 NOTE — Progress Notes (Signed)
Patient alert,oriented x3 , right breast ca rad txs=25/28 on 3/5 boost, bright erythenma,dermatitis, on top of breast, under fold of breast raw looking and skin thinning looks like start of peeling, using biafine cream, no c/o pain, just tenderness, already has f/u appt 1 month, completed this Thursday,eating and drinking okay, "i'm real blessed ,no fatigue stated 9:33 AM

## 2011-10-03 NOTE — Progress Notes (Signed)
Weekly Management Note:  Site:R Breast Current Dose:  5600  cGy Projected Dose: 6000  cGy  Narrative: The patient is seen today for routine under treatment assessment. CBCT/MVCT images/port films were reviewed. The chart was reviewed.   She is without new complaints today. She recently started using Biafine which has been helpful. No significant fatigue.  Physical Examination:  Filed Vitals:   10/03/11 0929  BP: 116/71  Pulse: 58  Temp: 97.9 F (36.6 C)  Resp: 20  .  Weight: 188 lb 8 oz (85.503 kg). There is erythema the skin along the right breast with patchy dry desquamation along the inframammary region. No areas of moist desquamation.  Impression: Tolerating radiation therapy well.  Plan: Continue radiation therapy as planned. She'll finish her treatment this Thursday and then see Dr. Basilio Cairo for a followup visit in one month.

## 2011-10-04 ENCOUNTER — Ambulatory Visit
Admission: RE | Admit: 2011-10-04 | Discharge: 2011-10-04 | Disposition: A | Discharge status: Home / Self Care | Payer: Medicare Other | Source: Ambulatory Visit | Attending: Radiation Oncology | Admitting: Radiation Oncology

## 2011-10-04 DIAGNOSIS — Z510 Encounter for antineoplastic radiation therapy: Secondary | ICD-10-CM | POA: Diagnosis not present

## 2011-10-04 DIAGNOSIS — C50419 Malignant neoplasm of upper-outer quadrant of unspecified female breast: Secondary | ICD-10-CM | POA: Diagnosis not present

## 2011-10-04 DIAGNOSIS — Z79899 Other long term (current) drug therapy: Secondary | ICD-10-CM | POA: Diagnosis not present

## 2011-10-05 ENCOUNTER — Encounter: Payer: Self-pay | Admitting: Gastroenterology

## 2011-10-05 ENCOUNTER — Encounter: Payer: Self-pay | Admitting: Radiation Oncology

## 2011-10-05 ENCOUNTER — Ambulatory Visit
Admission: RE | Admit: 2011-10-05 | Discharge: 2011-10-05 | Disposition: A | Discharge status: Home / Self Care | Payer: Medicare Other | Source: Ambulatory Visit | Attending: Radiation Oncology | Admitting: Radiation Oncology

## 2011-10-05 DIAGNOSIS — Z510 Encounter for antineoplastic radiation therapy: Secondary | ICD-10-CM | POA: Diagnosis not present

## 2011-10-05 DIAGNOSIS — Z79899 Other long term (current) drug therapy: Secondary | ICD-10-CM | POA: Diagnosis not present

## 2011-10-05 DIAGNOSIS — C50419 Malignant neoplasm of upper-outer quadrant of unspecified female breast: Secondary | ICD-10-CM | POA: Diagnosis not present

## 2011-10-09 NOTE — Progress Notes (Signed)
  Radiation Oncology         ([phone]) [phone] ________________________________  Name: Joy Gonzales MRN: [id number]  Date: 10/05/2011  DOB: 1940/09/12  End of Treatment Note  Diagnosis:  T1cN0M0 ER/PR positive HER-2/neu negative grade 3 invasive ductal carcinoma of the upper-outer quadrant of the right breast Ki-67 50%  Indication for treatment:  curative  Radiation treatment dates:  08/24/2011-10/05/2011  Site/dose:   1) Right Breast / 50 Gy/25 fractions 2) Right breast boost / 10Gy/5 fractions  Beams/energy:   1) opposed tangents  / 6 and 10 MV photons 2) electron boost / 15 MeV electrons  Narrative: The patient tolerated radiation treatment relatively well with erythema and dry desquamation over her breast.     Plan: The patient has completed radiation treatment. The patient will return to radiation oncology clinic for routine followup in one month. I advised them to call or return sooner if they have any questions or concerns related to their recovery or treatment.  -----------------------------------  Lonie Peak, MD

## 2011-10-24 NOTE — Progress Notes (Signed)
[employer], operational Note  Diagnosis: Breast Cancer   The patient's CT images from her initial simulation were reviewed to plan her boost treatment to her right breast  lumpectomy cavity.  Measurements were made regarding the size and depth of the surgical bed. The boost to the lumpectomy cavity will be delivered with 15 MeV electrons prescribed to the 99% isodose line.  10 Gy in 5 fractions has been prescribed.   A special port plan was reviewed and approved. A custom electron cut-out will be used for her boost field.

## 2011-10-25 ENCOUNTER — Telehealth: Payer: Self-pay | Admitting: *Deleted

## 2011-10-25 ENCOUNTER — Ambulatory Visit (HOSPITAL_BASED_OUTPATIENT_CLINIC_OR_DEPARTMENT_OTHER): Payer: Medicare Other | Admitting: Oncology

## 2011-10-25 ENCOUNTER — Encounter: Payer: Self-pay | Admitting: Oncology

## 2011-10-25 VITALS — BP 104/71 | HR 61 | Temp 98.9°F | Resp 20 | Ht 63.0 in | Wt 188.0 lb

## 2011-10-25 DIAGNOSIS — Z170 Estrogen receptor positive status [ER+]: Secondary | ICD-10-CM | POA: Diagnosis not present

## 2011-10-25 DIAGNOSIS — C50419 Malignant neoplasm of upper-outer quadrant of unspecified female breast: Secondary | ICD-10-CM | POA: Diagnosis not present

## 2011-10-25 DIAGNOSIS — E559 Vitamin D deficiency, unspecified: Secondary | ICD-10-CM

## 2011-10-25 DIAGNOSIS — Z79811 Long term (current) use of aromatase inhibitors: Secondary | ICD-10-CM

## 2011-10-25 HISTORY — DX: Long term (current) use of aromatase inhibitors: Z79.811

## 2011-10-25 MED ORDER — ANASTROZOLE 1 MG PO TABS: 1.0000 mg | ORAL_TABLET | Freq: Every day | ORAL | Status: DC

## 2011-10-25 NOTE — Telephone Encounter (Signed)
Made patient appointment for 01-25-2012 at 10:30am starting with labs

## 2011-10-25 NOTE — Progress Notes (Signed)
OFFICE PROGRESS NOTE  CC  Garlan Fillers, MD [address] [facility], [place]. [place] [address] Dr. Lonie Peak Dr. 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  PRIOR THERAPY:   #1 patient was originally seen at the multidisciplinary breast clinic with new diagnosis of right breast mass. She has since gone on to have a right breast lumpectomy.  #2 the pathology showed a 1.7 cm invasive ductal carcinoma grade 3 without LV I but associated ductal carcinoma in situ. One sentinel node was negative for metastatic disease.the tumor was ER +100% PR +100% Ki-67 50% and HER-2/neu negative  #3 patient had an Oncotype DX testing done that showed a breast cancer recurrence score of 9 giving her a 5 year risk of distant recurrence of 6%   #4.Patient is now status post radiation therapy to the right breast. She completed this on 09/23/2011  #4 patient will now begin anastrozole 1 mg daily starting 10/24/2011.A total of 5 years of therapy is planned.  CURRENT THERAPY: Anastrozole 1 mg daily  INTERVAL HISTORY: Joy Gonzales 71 y.o. female returns for followup visit After completing her radiation therapy. Overall she tolerated the radiation quite nicely without significant desquamation of the skin. Today she denies any fevers chills night sweats headaches no shortness of breath no chest pains or palpitations no myalgias or arthralgias. Patient is concerned about some moles on her right breast which have gotten a little bit more dark since the radiation. We did discuss the possibility of her going to see her dermatologist for evaluation. Although I do think that the are not of great concern. She has no nausea or vomiting no dominant no pain no diarrhea or constipation no vaginal discharge. Remainder of the 10 point review of  systems is negative. MEDICAL HISTORY: Past Medical History  Diagnosis Date  . HTN (hypertension)   . GERD (gastroesophageal reflux disease)   . Hyperlipemia   . Elevated LFTs   . Tinnitus   . Diverticulosis   . Vocal cord granuloma   . IBS (irritable bowel syndrome)   . Rectocele   . Lichen sclerosus   . Atrophic vaginitis   . HSV-1 infection   . Osteopenia   . Bladder cancer   . Flushing   . Dyspnea     on exertion  . PONV (postoperative nausea and vomiting)     ALLERGIES:  is allergic to adhesive and felodipine er.  MEDICATIONS:  Current Outpatient Prescriptions  Medication Sig Dispense Refill  . ALPRAZolam (XANAX) 0.25 MG tablet Take 0.125 mg by mouth daily as needed. Take infrequently      . aspirin 81 MG tablet Take 81 mg by mouth daily.        . calcium carbonate (TITRALAC) 420 MG CHEW Chew 420 mg by mouth.      . clobetasol ointment (TEMOVATE) 0.05 %       . esomeprazole (NEXIUM) 40 MG capsule Take 40 mg by mouth daily.        . fenofibrate micronized (LOFIBRA) 200 MG capsule Take 200 mg by mouth daily.        . fexofenadine (ALLEGRA) 180 MG tablet Take 180 mg by mouth as needed.        . metoprolol succinate (TOPROL XL) 25 MG 24 hr tablet Take 25 mg by mouth daily.        Marland Kitchen  olmesartan-hydrochlorothiazide (BENICAR HCT) 20-12.5 MG per tablet Take 1 tablet by mouth daily.      . ranitidine (ZANTAC) 75 MG tablet Take 75 mg by mouth at bedtime.       . valACYclovir (VALTREX) 500 MG tablet Take 500 mg by mouth 2 (two) times daily. When she has a fever blister only        SURGICAL HISTORY:  Past Surgical History  Procedure Date  . Tonsillectomy and adenoidectomy   . Throat surgery     LARYN. GRANULOMA  POLYP  . Colposcopy   . Cholecystectomy   . Bladder tumor excision   . Breast tumor removed   . Hysteroscopy w/d&Gonzales 05/18/2011    Procedure: DILATATION AND CURETTAGE /HYSTEROSCOPY;  Surgeon: Trellis Paganini, MD;  Location: [facility];  Service: Gynecology;  Laterality:  N/A;  or Tuesday, March 12 7:30am? or Thursday, March 14 7:30am? or Tuesday March 19 7:30am?  . Dilation and curettage of uterus   . Hysteroscopy   . Needle core biospy 06/20/2011    Right Breast, 12'Oclock - Invasive Mammary Carcinoma with Lymphovascular Invasion: ER/PR 100%, Ki67 50%  . Breast lumpectomy 07/05/2011    Right Breast- Invasive Ductal , Grade III, ducatal Carrcinoma  In-situ; 0/1 Node Negative    REVIEW OF SYSTEMS:  Pertinent items are noted in HPI.   PHYSICAL EXAMINATION: General appearance: alert, cooperative and appears stated age Lymph nodes: Cervical, supraclavicular, and axillary nodes normal. Resp: clear to auscultation bilaterally and normal percussion bilaterally Cardio: regular rate and rhythm, S1, S2 normal, no murmur, click, rub or gallop GI: soft, non-tender; bowel sounds normal; no masses,  no organomegaly Extremities: extremities normal, atraumatic, no cyanosis or edema Neurologic: Grossly normal Right breast exam reveals a healing incisional scar as does the axilla there is no evidence of infections. No nipple discharge. Left breast no masses or nipple discharge.  ECOG PERFORMANCE STATUS: 0 - Asymptomatic  Blood pressure 104/71, pulse 61, temperature 98.9 F (37.2 Gonzales), temperature source Oral, resp. rate 20, height 5\' 3"  (1.6 m), weight 188 lb (85.276 kg), last menstrual period 03/13/1990.  LABORATORY DATA: Lab Results  Component Value Date   WBC 6.8 08/21/2011   HGB 14.7 08/21/2011   HCT 43.7 08/21/2011   MCV 91.8 08/21/2011   PLT 183 08/21/2011      Chemistry      Component Value Date/Time   NA 143 08/21/2011 1544   K 3.5 08/21/2011 1544   CL 105 08/21/2011 1544   CO2 28 08/21/2011 1544   BUN 15 08/21/2011 1544   CREATININE 0.78 08/21/2011 1544      Component Value Date/Time   CALCIUM 9.4 08/21/2011 1544   ALKPHOS 85 08/21/2011 1544   AST 32 08/21/2011 1544   ALT 45* 08/21/2011 1544   BILITOT 0.2* 08/21/2011 1544     Patient Name: Joy Gonzales, Joy Gonzales  Accession #: [id number] DOB: 1941-02-19 Age: 66 Gender: F Client Name [facility] Collected Date: 07/05/2011 Received Date: 07/05/2011 Physician: Glenna Fellows Chart #: MRN # : [id number] Physician cc: Kaylyn Lim, RN Christiana Pellant, MD Race:W Visit #: [id number] Edyth Gunnels, MD REPORT OF SURGICAL PATHOLOGY ADDITIONAL INFORMATION: 1. A sample (Block 1A) was sent to [facility] for Oncotype testing. The patient's recurrence score is 9. Those patients with a recurrence score of 9 had an average rate of distant recurrence of 6%. (JK:mw 08-01-11) Pecola Leisure MD Pathologist, Electronic Signature ( Signed 08/01/2011) 1. CHROMOGENIC IN-SITU HYBRIDIZATION Interpretation HER-2/NEU BY CISH -  NO AMPLIFICATION OF HER-2 DETECTED. THE RATIO OF HER-2: CEP 17 SIGNALS WAS 1.21. Reference range: Ratio: HER2:CEP17 < 1.8 - gene amplification not observed Ratio: HER2:CEP 17 1.8-2.2 - equivocal result Ratio: HER2:CEP17 > 2.2 - gene amplification observed Pecola Leisure MD Pathologist, Electronic Signature ( Signed 07/11/2011) FINAL DIAGNOSIS 1 of 3 FINAL for Joy Gonzales, Joy Gonzales ([id number]) Diagnosis 1. Breast, lumpectomy, Right - INVASIVE DUCTAL CARCINOMA, GRADE III (1.7 CM). - INVASIVE TUMOR IS 0.1MM FROM NEAREST MARGIN (ANTERIOR) - NO LYMPHOVASCULAR INVASION IDENTIFIED. - DUCTAL CARCINOMA IN SITU. - SEE TUMOR SYNOPTIC TEMPLATE BELOW. 2. Lymph node, sentinel, biopsy, Right - ONE LYMPH NODE, NEGATIVE FOR TUMOR (0/1) SEE COMMENT Microscopic Comment 1. BREAST, INVASIVE TUMOR, WITH LYMPH NODE SAMPLING Specimen, including laterality: Right breast. Procedure: Lumpectomy. Grade: III of III Tubule formation: 2 Nuclear pleomorphism: 3 Mitotic:2 Tumor size (gross measurement): 1.7 cm Margins: Invasive, distance to closest margin: 0.68mm In-situ, distance to closest margin: 0.7cm If margin positive, focally or broadly: N/A Lymphovascular invasion: Absent Ductal carcinoma in  situ: Present. Grade: II-III Extensive intraductal component: Absent. Lobular neoplasia: Absent. Tumor focality: Unifocal Treatment effect: None. If present, treatment effect in breast tissue, lymph nodes or both: None. Extent of tumor: Skin: N/A Nipple: N/A Skeletal muscle: N/A Lymph nodes: # examined: 1 Lymph nodes with metastasis: 0 Extracapsular extension: n/a Breast prognostic profile: Estrogen receptor: Not repeated, previous study demonstrated 100% positivity ([id number]) Progesterone receptor: Not repeated, previous study demonstrated 100% positivity ([id number]) Her 2 neu: Repeated, previous study demonstrated no amplification (1.21), ([id number]) Ki-67: Not repeated, previous study demonstrated 50% proliferation rate ([id number]). Non-neoplastic breast: Fibrocystic change and fibrosis. TNM: pT1c, pN0, pMX (CRR:gt, 07/07/11) 2. Cytokeratin Ae1/3 immunostain does not demonstrate any intranodal metastatic epithelial tumor deposits. There are hyalinized and calcified intra-nodal nodules present without necrosis. GMS stain is negative for yeast and fungi. [place] RUND DO Pathologist, Electronic Signature  RADIOGRAPHIC STUDIES:  No results found.  ASSESSMENT: 71 year old female with  #1 stage I (T1 Gonzales. 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 Tumor was ER +100% PR +100% HER-2/neu negative with Ki-67 of 50%.  #2 Oncotype testing revealed a distant recurrence of only 6%   #3 Patient is now status post radiation therapy to the right breast.  PLAN:   #1 we will proceed with adjuvant antiestrogen therapy with anastrozole 1 mg daily. Risks and benefits of anastrozole were discussed with the patient and literature has been given to her. She was given a prescription for 30 days with 12 refills. I will plan on seeing her back in 3 month's time in  followup.  #2 we discussed healthy eating and exercises and weight loss.  #3 patient knows to call me with any problems.   All questions were answered. The patient knows to call the clinic with any problems, questions or concerns. We can certainly see the patient much sooner if necessary.  I spent 25 minutes counseling the patient face to face. The total time spent in the appointment was 30 minutes.

## 2011-10-25 NOTE — Patient Instructions (Addendum)
1. Start anastrozole 1 mg daily to prevent the cancer from coming back. Information as below   2. i will see you back in 3 months in follow up  3. For the skin try Calendula cream, you can get this at health food store  Anastrozole tablets What is this medicine? ANASTROZOLE (an AS troe zole) is used to treat breast cancer in women who have gone through menopause. Some types of breast cancer depend on estrogen to grow, and this medicine can stop tumor growth by blocking estrogen production. This medicine may be used for other purposes; ask your health care provider or pharmacist if you have questions. What should I tell my health care provider before I take this medicine? They need to know if you have any of these conditions: -liver disease -an unusual or allergic reaction to anastrozole, other medicines, foods, dyes, or preservatives -pregnant or trying to get pregnant -breast-feeding How should I use this medicine? Take this medicine by mouth with a glass of water. Follow the directions on the prescription label. You can take this medicine with or without food. Take your doses at regular intervals. Do not take your medicine more often than directed. Do not stop taking except on the advice of your doctor or health care professional. Talk to your pediatrician regarding the use of this medicine in children. Special care may be needed. Overdosage: If you think you have taken too much of this medicine contact a poison control center or emergency room at once. NOTE: This medicine is only for you. Do not share this medicine with others. What if I miss a dose? If you miss a dose, take it as soon as you can. If it is almost time for your next dose, take only that dose. Do not take double or extra doses. What may interact with this medicine? Do not take this medicine with any of the following medications: -female hormones, like estrogens or progestins and birth control pills This medicine may also  interact with the following medications: -tamoxifen This list may not describe all possible interactions. Give your health care provider a list of all the medicines, herbs, non-prescription drugs, or dietary supplements you use. Also tell them if you smoke, drink alcohol, or use illegal drugs. Some items may interact with your medicine. What should I watch for while using this medicine? Visit your doctor or health care professional for regular checks on your progress. Let your doctor or health care professional know about any unusual vaginal bleeding. Do not treat yourself for diarrhea, nausea, vomiting or other side effects. Ask your doctor or health care professional for advice. What side effects may I notice from receiving this medicine? Side effects that you should report to your doctor or health care professional as soon as possible: -allergic reactions like skin rash, itching or hives, swelling of the face, lips, or tongue -any new or unusual symptoms -breathing problems -chest pain -leg pain or swelling -vomiting Side effects that usually do not require medical attention (report to your doctor or health care professional if they continue or are bothersome): -back or bone pain -cough, or throat infection -diarrhea or constipation -dizziness -headache -hot flashes -loss of appetite -nausea -sweating -weakness and tiredness -weight gain This list may not describe all possible side effects. Call your doctor for medical advice about side effects. You may report side effects to FDA at 1-800-FDA-1088. Where should I keep my medicine? Keep out of the reach of children. Store at room temperature between 20  and 25 degrees C (68 and 77 degrees F). Throw away any unused medicine after the expiration date. NOTE: This sheet is a summary. It may not cover all possible information. If you have questions about this medicine, talk to your doctor, pharmacist, or health care provider.  2012,  Elsevier/Gold Standard. (05/10/2007 4:31:52 PM)

## 2011-11-09 ENCOUNTER — Ambulatory Visit (INDEPENDENT_AMBULATORY_CARE_PROVIDER_SITE_OTHER): Payer: Medicare Other | Admitting: Obstetrics and Gynecology

## 2011-11-09 ENCOUNTER — Encounter: Payer: Self-pay | Admitting: Obstetrics and Gynecology

## 2011-11-09 VITALS — BP 124/78 | Ht 62.0 in | Wt 185.0 lb

## 2011-11-09 DIAGNOSIS — N904 Leukoplakia of vulva: Secondary | ICD-10-CM | POA: Diagnosis not present

## 2011-11-09 DIAGNOSIS — B009 Herpesviral infection, unspecified: Secondary | ICD-10-CM | POA: Diagnosis not present

## 2011-11-09 DIAGNOSIS — C50919 Malignant neoplasm of unspecified site of unspecified female breast: Secondary | ICD-10-CM

## 2011-11-09 DIAGNOSIS — N952 Postmenopausal atrophic vaginitis: Secondary | ICD-10-CM

## 2011-11-09 MED ORDER — VALACYCLOVIR HCL 500 MG PO TABS: 500.0000 mg | ORAL_TABLET | Freq: Two times a day (BID) | ORAL | Status: DC

## 2011-11-09 MED ORDER — CLOBETASOL PROPIONATE 0.05 % EX OINT: TOPICAL_OINTMENT | Freq: Two times a day (BID) | CUTANEOUS | Status: DC

## 2011-11-09 NOTE — Progress Notes (Signed)
Patient came to see me today for further followup. She was diagnosed with breast cancer, right this year. She has been treated with lumpectomy, radiation and now Arimidex. She had been on Estring for atrophic vaginitis but her oncologist wants her to stop it. She wanted to do what she could do for treatment. We had seen her early in the year for postmenopausal bleeding and assessment was normal. She has had no bleeding since then. She is having no pelvic pain. We treat her with Valtrex when she gets fever blisters with excellent results. She is also been diagnosed with lichen sclerosis with several biopsies. The last biopsy was 1998. She uses Temovate with good results. She has always had normal Pap smears. Her last Pap smear was 2012. She is due for followup bone density. Her last bone density was 2009. She had stable osteopenia without an elevated fracture risk. She has had no fractures.  Review of systems: 12 system review done. Pertinent positives above. Other positives include hyperlipidemia, dyspnea and contact dermatitis.  Physical examination:kim Julian Reil present. HEENT within normal limits. Neck: Thyroid not large. No masses. Supraclavicular nodes: not enlarged. Breasts: Examined in both sitting and lying  position. No skin changes and no masses. Abdomen: Soft no guarding rebound or masses or hernia. Pelvic: External: Within normal limits. BUS: Within normal limits. Vaginal:within normal limits. Poor estrogen effect. No evidence of cystocele  or enterocele. First degree rectocele.  Cervix: clean. Uterus: Normal size and shape. Adnexa: No masses. Rectovaginal exam: Confirmatory and negative. Extremities: Within normal limits.  Assessment: #1. Breast cancer #2. Osteopenia 3. HSV 1 #4.  Lichen sclerosis.#5. Rectocele  Plan: Continue yearly mammograms. Bone density. Continue Temovate. Continue Valtrex.The new Pap smear guidelines were discussed with the patient. No Pap done.

## 2011-11-09 NOTE — Patient Instructions (Signed)
Schedule bone density.    

## 2011-11-10 ENCOUNTER — Ambulatory Visit
Admission: RE | Admit: 2011-11-10 | Discharge: 2011-11-10 | Disposition: A | Discharge status: Home / Self Care | Payer: Medicare Other | Source: Ambulatory Visit | Attending: Radiation Oncology | Admitting: Radiation Oncology

## 2011-11-10 ENCOUNTER — Encounter: Payer: Self-pay | Admitting: Radiation Oncology

## 2011-11-10 VITALS — BP 113/61 | HR 66 | Temp 97.8°F | Wt 184.9 lb

## 2011-11-10 DIAGNOSIS — C50419 Malignant neoplasm of upper-outer quadrant of unspecified female breast: Secondary | ICD-10-CM

## 2011-11-10 HISTORY — DX: Personal history of irradiation: Z92.3

## 2011-11-10 HISTORY — DX: Long term (current) use of aromatase inhibitors: Z79.811

## 2011-11-10 NOTE — Progress Notes (Signed)
  Radiation Oncology         [phone] ________________________________  Name: Joy Gonzales MRN: [id number]  Date: 11/10/2011  DOB: 05/11/1940  Follow-Up Visit Note  CC: Garlan Fillers, MD  Hoxworth, Lorne Skeens, MD  Diagnosis:  T1 CN 0 M0 ER/PR positive HER-2/neu negative grade 3 invasive ductal carcinoma of the right breast   Interval Since Last Radiation:  She completed 50 gray in 25 fractions followed by a boost of 10 gray in 5 fractions to the right breast 10-05-11  Narrative:  The patient returns today for routine follow-up.  She is taking Arimidex and tolerating that well. She has no complaints.                          ALLERGIES:  is allergic to adhesive and felodipine er.  Meds: Current Outpatient Prescriptions  Medication Sig Dispense Refill  . ALPRAZolam (XANAX) 0.25 MG tablet Take 0.125 mg by mouth daily as needed. Take infrequently      . anastrozole (ARIMIDEX) 1 MG tablet Take 1 tablet (1 mg total) by mouth daily.  30 tablet  12  . aspirin 81 MG tablet Take 81 mg by mouth daily.        . calcium carbonate (TITRALAC) 420 MG CHEW Chew 420 mg by mouth.      . clobetasol ointment (TEMOVATE) 0.05 % Apply topically 2 (two) times daily.  30 g  5  . esomeprazole (NEXIUM) 40 MG capsule Take 40 mg by mouth daily.        . fenofibrate micronized (LOFIBRA) 200 MG capsule Take 200 mg by mouth daily.        . fexofenadine (ALLEGRA) 180 MG tablet Take 180 mg by mouth as needed.        . metoprolol succinate (TOPROL XL) 25 MG 24 hr tablet Take 25 mg by mouth daily.        Marland Kitchen olmesartan-hydrochlorothiazide (BENICAR HCT) 20-12.5 MG per tablet Take 1 tablet by mouth daily.      . ranitidine (ZANTAC) 75 MG tablet Take 75 mg by mouth at bedtime.       . valACYclovir (VALTREX) 500 MG tablet Take 1 tablet (500 mg total) by mouth 2 (two) times daily. When she has a fever blister only  30 tablet  2    Physical Findings: The patient is in no acute distress. Patient is alert and oriented.  weight is 184 lb 14.4 oz (83.87 kg). Her temperature is 97.8 F (36.6 C). Her blood pressure is 113/61 and her pulse is 66. Marland Kitchen  No significant changes. The right breast has healed well with a little bit of residual hyperpigmentation.   Lab Findings: Lab Results  Component Value Date   WBC 6.8 08/21/2011   HGB 14.7 08/21/2011   HCT 43.7 08/21/2011   MCV 91.8 08/21/2011   PLT 183 08/21/2011     Radiographic Findings: No results found.  Impression:  The patient is recovering from the effects of radiation.   Plan:  Continue Arimidex as prescribed. Use vitamin E lotion or vitamin E oil over the breast for healing. I will see her back on an as-needed basis. She has my contact information and I encouraged her to call should any issues in the future. It was a pleasure to see her today and she is doing very well. She understands she will need to continue yearly mammography.   _____________________________________   Lonie Peak, MD

## 2011-11-10 NOTE — Progress Notes (Signed)
FU today to assess following radiation to the right breast which completed on 10/05/11.  Denies any pain or fatigue.  Right breast with faint hyperpigmentation and redness, but skin soft and intact.

## 2011-11-21 ENCOUNTER — Ambulatory Visit (INDEPENDENT_AMBULATORY_CARE_PROVIDER_SITE_OTHER): Payer: Medicare Other

## 2011-11-21 DIAGNOSIS — M8580 Other specified disorders of bone density and structure, unspecified site: Secondary | ICD-10-CM

## 2011-11-21 DIAGNOSIS — M899 Disorder of bone, unspecified: Secondary | ICD-10-CM | POA: Diagnosis not present

## 2011-11-21 DIAGNOSIS — M949 Disorder of cartilage, unspecified: Secondary | ICD-10-CM | POA: Diagnosis not present

## 2011-11-24 DIAGNOSIS — Z23 Encounter for immunization: Secondary | ICD-10-CM | POA: Diagnosis not present

## 2011-12-04 DIAGNOSIS — I10 Essential (primary) hypertension: Secondary | ICD-10-CM | POA: Diagnosis not present

## 2011-12-04 DIAGNOSIS — E785 Hyperlipidemia, unspecified: Secondary | ICD-10-CM | POA: Diagnosis not present

## 2011-12-04 DIAGNOSIS — R82998 Other abnormal findings in urine: Secondary | ICD-10-CM | POA: Diagnosis not present

## 2011-12-11 DIAGNOSIS — K219 Gastro-esophageal reflux disease without esophagitis: Secondary | ICD-10-CM | POA: Diagnosis not present

## 2011-12-11 DIAGNOSIS — I10 Essential (primary) hypertension: Secondary | ICD-10-CM | POA: Diagnosis not present

## 2011-12-11 DIAGNOSIS — E785 Hyperlipidemia, unspecified: Secondary | ICD-10-CM | POA: Diagnosis not present

## 2011-12-11 DIAGNOSIS — Z0000 Encounter for general adult medical examination without abnormal findings: Secondary | ICD-10-CM | POA: Diagnosis not present

## 2011-12-12 DIAGNOSIS — Z1212 Encounter for screening for malignant neoplasm of rectum: Secondary | ICD-10-CM | POA: Diagnosis not present

## 2011-12-28 ENCOUNTER — Other Ambulatory Visit: Payer: Self-pay

## 2011-12-28 DIAGNOSIS — D2330 Other benign neoplasm of skin of unspecified part of face: Secondary | ICD-10-CM | POA: Diagnosis not present

## 2011-12-28 DIAGNOSIS — D485 Neoplasm of uncertain behavior of skin: Secondary | ICD-10-CM | POA: Diagnosis not present

## 2011-12-28 DIAGNOSIS — L570 Actinic keratosis: Secondary | ICD-10-CM | POA: Diagnosis not present

## 2011-12-28 DIAGNOSIS — L820 Inflamed seborrheic keratosis: Secondary | ICD-10-CM | POA: Diagnosis not present

## 2011-12-28 DIAGNOSIS — Z85828 Personal history of other malignant neoplasm of skin: Secondary | ICD-10-CM | POA: Diagnosis not present

## 2011-12-28 DIAGNOSIS — L821 Other seborrheic keratosis: Secondary | ICD-10-CM | POA: Diagnosis not present

## 2012-01-25 ENCOUNTER — Encounter: Payer: Self-pay | Admitting: Oncology

## 2012-01-25 ENCOUNTER — Other Ambulatory Visit (HOSPITAL_BASED_OUTPATIENT_CLINIC_OR_DEPARTMENT_OTHER): Payer: Medicare Other | Admitting: Lab

## 2012-01-25 ENCOUNTER — Telehealth: Payer: Self-pay | Admitting: Oncology

## 2012-01-25 ENCOUNTER — Ambulatory Visit (HOSPITAL_BASED_OUTPATIENT_CLINIC_OR_DEPARTMENT_OTHER): Payer: Medicare Other | Admitting: Oncology

## 2012-01-25 VITALS — BP 110/70 | HR 62 | Temp 98.7°F | Resp 20 | Ht 62.0 in | Wt 184.0 lb

## 2012-01-25 DIAGNOSIS — E559 Vitamin D deficiency, unspecified: Secondary | ICD-10-CM | POA: Diagnosis not present

## 2012-01-25 DIAGNOSIS — C50419 Malignant neoplasm of upper-outer quadrant of unspecified female breast: Secondary | ICD-10-CM

## 2012-01-25 DIAGNOSIS — Z170 Estrogen receptor positive status [ER+]: Secondary | ICD-10-CM

## 2012-01-25 LAB — CBC WITH DIFFERENTIAL/PLATELET
Eosinophils Absolute: 0.2 10*3/uL (ref 0.0–0.5)
MONO#: 0.5 10*3/uL (ref 0.1–0.9)
NEUT#: 2.8 10*3/uL (ref 1.5–6.5)
RBC: 4.68 10*6/uL (ref 3.70–5.45)
RDW: 12.5 % (ref 11.2–14.5)
WBC: 5 10*3/uL (ref 3.9–10.3)

## 2012-01-25 LAB — COMPREHENSIVE METABOLIC PANEL (CC13)
ALT: 44 U/L (ref 0–55)
CO2: 25 mEq/L (ref 22–29)
Chloride: 109 mEq/L — ABNORMAL HIGH (ref 98–107)
Sodium: 141 mEq/L (ref 136–145)
Total Bilirubin: 0.45 mg/dL (ref 0.20–1.20)
Total Protein: 6.6 g/dL (ref 6.4–8.3)

## 2012-01-25 NOTE — Telephone Encounter (Signed)
gve the pt her may 2014 appt calendar 

## 2012-01-25 NOTE — Progress Notes (Signed)
OFFICE PROGRESS NOTE  CC  Joy Fillers, Joy Gonzales [address] [facility], [place]. [place] [address] Dr. Lonie Peak Dr. 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  PRIOR THERAPY:   #1 patient was originally seen at the multidisciplinary breast clinic with new diagnosis of right breast mass. She has since gone on to have a right breast lumpectomy.  #2 the pathology showed a 1.7 cm invasive ductal carcinoma grade 3 without LV I but associated ductal carcinoma in situ. One sentinel node was negative for metastatic disease.the tumor was ER +100% PR +100% Ki-67 50% and HER-2/neu negative  #3 patient had an Oncotype DX testing done that showed a breast cancer recurrence score of 9 giving her a 5 year risk of distant recurrence of 6%   #4.Patient is now status post radiation therapy to the right breast. She completed this on 09/23/2011  #4 patient will now begin anastrozole 1 mg daily starting 10/24/2011.A total of 5 years of therapy is planned.  CURRENT THERAPY: Anastrozole 1 mg daily  INTERVAL HISTORY: AMILIYAH Gonzales 71 y.o. female returns for followup visit. Patient feels well overall. She is tolerating Arimidex without any significant problems. She does experience occasional hot flashes. She has no night sweats. No nausea or vomiting no headaches double vision blurring of vision no shortness of breath no chest pains no lower extremity swelling no vaginal bleeding or discharge. No back pain no peripheral paresthesias. No myalgias and arthralgias. No dizziness. Remainder of the 10 point review of systems is negative.    MEDICAL HISTORY: Past Medical History  Diagnosis Date  . HTN (hypertension)   . GERD (gastroesophageal reflux disease)   . Hyperlipemia   . Elevated LFTs   . Tinnitus   . Diverticulosis     . Vocal cord granuloma   . IBS (irritable bowel syndrome)   . Rectocele   . Lichen sclerosus   . Atrophic vaginitis   . HSV-1 infection   . Osteopenia   . Flushing   . Dyspnea     on exertion  . PONV (postoperative nausea and vomiting)   . Bladder cancer   . Breast cancer   . S/P radiation therapy 08/24/11 - 10/05/11    Right Breast: 50 Gy/25 Fractions with boost of 10Gy/5 Fractions  . Use of anastrozole (Arimidex) 10/25/11    ALLERGIES:  is allergic to adhesive and felodipine er.  MEDICATIONS:  Current Outpatient Prescriptions  Medication Sig Dispense Refill  . ALPRAZolam (XANAX) 0.25 MG tablet Take 0.125 mg by mouth daily as needed. Take infrequently      . aspirin 81 MG tablet Take 81 mg by mouth daily.        . calcium carbonate (TITRALAC) 420 MG CHEW Chew 420 mg by mouth.      . cholecalciferol (VITAMIN D) 400 UNITS TABS Take 400 Units by mouth.      . clobetasol ointment (TEMOVATE) 0.05 % Apply topically 2 (two) times daily.  30 g  5  . esomeprazole (NEXIUM) 40 MG capsule Take 40 mg by mouth daily.        . fenofibrate micronized (LOFIBRA) 200 MG capsule Take 200 mg by mouth daily.        . fexofenadine (ALLEGRA) 180 MG tablet Take 180 mg by mouth as needed.        . metoprolol  succinate (TOPROL XL) 25 MG 24 hr tablet Take 25 mg by mouth daily.        Marland Kitchen olmesartan-hydrochlorothiazide (BENICAR HCT) 20-12.5 MG per tablet Take 1 tablet by mouth daily.      . ranitidine (ZANTAC) 75 MG tablet Take 75 mg by mouth at bedtime.       . valACYclovir (VALTREX) 500 MG tablet Take 1 tablet (500 mg total) by mouth 2 (two) times daily. When she has a fever blister only  30 tablet  2  . anastrozole (ARIMIDEX) 1 MG tablet Take 1 tablet (1 mg total) by mouth daily.  30 tablet  12    SURGICAL HISTORY:  Past Surgical History  Procedure Date  . Tonsillectomy and adenoidectomy   . Throat surgery     LARYN. GRANULOMA  POLYP  . Cholecystectomy   . Bladder tumor excision   . Breast tumor  removed   . Hysteroscopy w/d&c 05/18/2011    Procedure: DILATATION AND CURETTAGE /HYSTEROSCOPY;  Surgeon: Trellis Paganini, Joy Gonzales;  Location: [facility];  Service: Gynecology;  Laterality: N/A;  or Tuesday, March 12 7:30am? or Thursday, March 14 7:30am? or Tuesday March 19 7:30am?  . Dilation and curettage of uterus   . Hysteroscopy   . Needle core biospy 06/20/2011    Right Breast, 12'Oclock - Invasive Mammary Carcinoma with Lymphovascular Invasion: ER/PR 100%, Ki67 50%  . Breast lumpectomy 07/05/2011    Right Breast- Invasive Ductal , Grade III, ducatal Carrcinoma  In-situ; 0/1 Node Negative    REVIEW OF SYSTEMS:  Pertinent items are noted in HPI.   PHYSICAL EXAMINATION: General appearance: alert, cooperative and appears stated age Lymph nodes: Cervical, supraclavicular, and axillary nodes normal. Resp: clear to auscultation bilaterally and normal percussion bilaterally Cardio: regular rate and rhythm, S1, S2 normal, no murmur, click, rub or gallop GI: soft, non-tender; bowel sounds normal; no masses,  no organomegaly Extremities: extremities normal, atraumatic, no cyanosis or edema Neurologic: Grossly normal Right breast exam reveals well-healed incisional scar as does the axilla there is no evidence of infections. No nipple discharge. Left breast no masses or nipple discharge.  ECOG PERFORMANCE STATUS: 0 - Asymptomatic  Blood pressure 110/70, pulse 62, temperature 98.7 F (37.1 C), temperature source Oral, resp. rate 20, height 5\' 2"  (1.575 m), weight 184 lb (83.462 kg), last menstrual period 03/13/1990.  LABORATORY DATA: Lab Results  Component Value Date   WBC 5.0 01/25/2012   HGB 14.9 01/25/2012   HCT 43.3 01/25/2012   MCV 92.6 01/25/2012   PLT 175 01/25/2012      Chemistry      Component Value Date/Time   NA 141 01/25/2012 1049   NA 143 08/21/2011 1544   K 4.1 01/25/2012 1049   K 3.5 08/21/2011 1544   CL 109* 01/25/2012 1049   CL 105 08/21/2011 1544   CO2 25 01/25/2012 1049    CO2 28 08/21/2011 1544   BUN 17.0 01/25/2012 1049   BUN 15 08/21/2011 1544   CREATININE 0.9 01/25/2012 1049   CREATININE 0.78 08/21/2011 1544      Component Value Date/Time   CALCIUM 9.9 01/25/2012 1049   CALCIUM 9.4 08/21/2011 1544   ALKPHOS 94 01/25/2012 1049   ALKPHOS 85 08/21/2011 1544   AST 28 01/25/2012 1049   AST 32 08/21/2011 1544   ALT 44 01/25/2012 1049   ALT 45* 08/21/2011 1544   BILITOT 0.45 01/25/2012 1049   BILITOT 0.2* 08/21/2011 1544     Patient Name: Quezada, Nathalee C  Accession #: [id number] DOB: [birth year] Age: 51 Gender: F Client Name [facility] Collected Date: 07/05/2011 Received Date: 07/05/2011 Physician: Glenna Fellows Chart #: MRN # : [id number] Physician cc: Kaylyn Lim, RN Christiana Pellant, Joy Gonzales Race:W Visit #: [id number] Edyth Gunnels, Joy Gonzales REPORT OF SURGICAL PATHOLOGY ADDITIONAL INFORMATION: 1. A sample (Block 1A) was sent to [facility] for Oncotype testing. The patient's recurrence score is 9. Those patients with a recurrence score of 9 had an average rate of distant recurrence of 6%. (JK:mw 08-01-11) Pecola Leisure Joy Gonzales Pathologist, Electronic Signature ( Signed 08/01/2011) 1. CHROMOGENIC IN-SITU HYBRIDIZATION Interpretation HER-2/NEU BY CISH - NO AMPLIFICATION OF HER-2 DETECTED. THE RATIO OF HER-2: CEP 17 SIGNALS WAS 1.21. Reference range: Ratio: HER2:CEP17 < 1.8 - gene amplification not observed Ratio: HER2:CEP 17 1.8-2.2 - equivocal result Ratio: HER2:CEP17 > 2.2 - gene amplification observed Pecola Leisure Joy Gonzales Pathologist, Electronic Signature ( Signed 07/11/2011) FINAL DIAGNOSIS 1 of 3 FINAL for Menning, Shernita C ([id number]) Diagnosis 1. Breast, lumpectomy, Right - INVASIVE DUCTAL CARCINOMA, GRADE III (1.7 CM). - INVASIVE TUMOR IS 0.1MM FROM NEAREST MARGIN (ANTERIOR) - NO LYMPHOVASCULAR INVASION IDENTIFIED. - DUCTAL CARCINOMA IN SITU. - SEE TUMOR SYNOPTIC TEMPLATE BELOW. 2. Lymph node, sentinel, biopsy, Right - ONE LYMPH NODE,  NEGATIVE FOR TUMOR (0/1) SEE COMMENT Microscopic Comment 1. BREAST, INVASIVE TUMOR, WITH LYMPH NODE SAMPLING Specimen, including laterality: Right breast. Procedure: Lumpectomy. Grade: III of III Tubule formation: 2 Nuclear pleomorphism: 3 Mitotic:2 Tumor size (gross measurement): 1.7 cm Margins: Invasive, distance to closest margin: 0.13mm In-situ, distance to closest margin: 0.7cm If margin positive, focally or broadly: N/A Lymphovascular invasion: Absent Ductal carcinoma in situ: Present. Grade: II-III Extensive intraductal component: Absent. Lobular neoplasia: Absent. Tumor focality: Unifocal Treatment effect: None. If present, treatment effect in breast tissue, lymph nodes or both: None. Extent of tumor: Skin: N/A Nipple: N/A Skeletal muscle: N/A Lymph nodes: # examined: 1 Lymph nodes with metastasis: 0 Extracapsular extension: n/a Breast prognostic profile: Estrogen receptor: Not repeated, previous study demonstrated 100% positivity ([id number]) Progesterone receptor: Not repeated, previous study demonstrated 100% positivity ([id number]) Her 2 neu: Repeated, previous study demonstrated no amplification (1.21), ([id number]) Ki-67: Not repeated, previous study demonstrated 50% proliferation rate ([id number]). Non-neoplastic breast: Fibrocystic change and fibrosis. TNM: pT1c, pN0, pMX (CRR:gt, 07/07/11) 2. Cytokeratin Ae1/3 immunostain does not demonstrate any intranodal metastatic epithelial tumor deposits. There are hyalinized and calcified intra-nodal nodules present without necrosis. GMS stain is negative for yeast and fungi. [place] RUND DO Pathologist, Electronic Signature  RADIOGRAPHIC STUDIES:  No results found.  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 Tumor was ER +100% PR +100%  HER-2/neu negative with Ki-67 of 50%. No evidence of recurrent disease.  #2 Oncotype testing revealed a distant recurrence of only 6%   #3 Patient is now status post radiation therapy to the right breast.  #4 patient receiving Arimidex 1 mg daily since August 2013.  PLAN:   #1 patient will continue Arimidex 1 mg daily overall she is tolerating it well.  #2 she is counseled to have yearly diagnostic mammograms.  #3 she will be seen back in 6 months time. All questions were answered. The patient knows to call the clinic with any problems, questions or concerns. We can certainly see the patient much sooner if necessary.  I spent 25 minutes  counseling the patient face to face. The total time spent in the appointment was 30 minutes.

## 2012-01-25 NOTE — Patient Instructions (Addendum)
Continue arimidex  We will see you back in 6 months

## 2012-01-26 LAB — VITAMIN D 25 HYDROXY (VIT D DEFICIENCY, FRACTURES): Vit D, 25-Hydroxy: 21 ng/mL — ABNORMAL LOW (ref 30–89)

## 2012-01-29 ENCOUNTER — Telehealth: Payer: Self-pay | Admitting: *Deleted

## 2012-01-29 NOTE — Telephone Encounter (Signed)
Per NP notified pt to begin D3 1000 units daily. Per pt " I've only been taking 400 units and I just started that 2 weeks ago." Reconfirmed with pt to take 1000 units D3 daily and recheck labs as scheduled. Pt verbalized understanding. No further questions.

## 2012-01-29 NOTE — Telephone Encounter (Signed)
Message copied by Cooper Render on Mon Jan 29, 2012  9:18 AM ------      Message from: Laural Golden      Created: Fri Jan 26, 2012  8:23 AM       Is patient taking Vitamin D?  If not she needs 1000 IU D3 daily, if so she should double her dose.  Thanks!            ----- Message -----         From: [facility] Interface         Sent: 01/25/2012  10:59 AM           To: Victorino December, MD

## 2012-02-01 ENCOUNTER — Ambulatory Visit (INDEPENDENT_AMBULATORY_CARE_PROVIDER_SITE_OTHER): Payer: Medicare Other | Admitting: General Surgery

## 2012-02-01 VITALS — BP 110/70 | HR 60 | Temp 97.2°F | Resp 16 | Ht 60.25 in | Wt 183.8 lb

## 2012-02-01 DIAGNOSIS — C50419 Malignant neoplasm of upper-outer quadrant of unspecified female breast: Secondary | ICD-10-CM

## 2012-02-01 NOTE — Progress Notes (Signed)
Chief complaint: Followup breast cancer  History: Patient returns for more long-term followup status post lumpectomy and sentinel lymph node biopsy, radiation, and now on adjuvant Arimidex for T1 C. N0 cancer of the right breast with surgery in May of 2013. She reports she got from her radiation treatment without any difficulties and is tolerating her hormonal treatment with minimal if any side effects. She is very happy with the way she feels. She has not noted any recent changes in her breast.  Exam: BP 110/70  Pulse 60  Temp 97.2 F (36.2 C) (Temporal)  Resp 16  Ht 5' 0.25" (1.53 m)  Wt 183 lb 12.8 oz (83.371 kg)  BMI 35.60 kg/m2  LMP 03/13/1990 General: Mildly overweight but well-appearing Caucasian female Lymph nodes: No cervical, supraclavicular, or axillary nodes palpable Breasts: There are mild post radiation changes of the right breast. Lumpectomy site well healed without thickening or mass. No other masses in either breast. Lungs: Clear equal breath sounds  Assessment plan: Doing well without evidence of early recurrence or complication from treatment. She will see Dr. Park Breed in 6 months will see her in 9 months.

## 2012-04-27 ENCOUNTER — Other Ambulatory Visit: Payer: Self-pay

## 2012-05-01 DIAGNOSIS — R0982 Postnasal drip: Secondary | ICD-10-CM | POA: Diagnosis not present

## 2012-05-01 DIAGNOSIS — J342 Deviated nasal septum: Secondary | ICD-10-CM | POA: Diagnosis not present

## 2012-05-01 DIAGNOSIS — J343 Hypertrophy of nasal turbinates: Secondary | ICD-10-CM | POA: Diagnosis not present

## 2012-05-01 DIAGNOSIS — J3489 Other specified disorders of nose and nasal sinuses: Secondary | ICD-10-CM | POA: Diagnosis not present

## 2012-05-15 ENCOUNTER — Other Ambulatory Visit: Payer: Self-pay | Admitting: Oncology

## 2012-05-15 DIAGNOSIS — Z853 Personal history of malignant neoplasm of breast: Secondary | ICD-10-CM

## 2012-06-12 IMAGING — US US BIOPSY
1 series · 10 of 10 positions shown · non-contrast
Comparison: Prior exams

CLINICAL DATA: Suspicious right breast mass 12 o'clock location

ULTRASOUND GUIDED VACUUM ASSISTED CORE BIOPSY OF THE RIGHT BREAST

[Series 3: us biopsy · 10 of 10 slices shown]
[im 1/10]
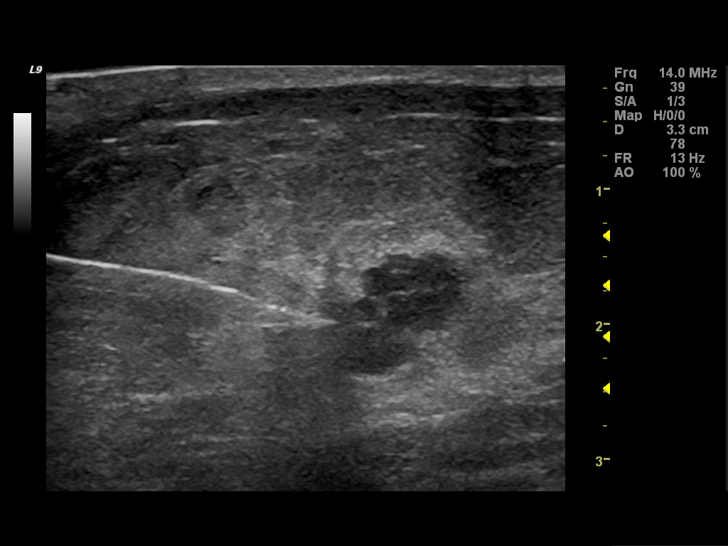
[im 2/10]
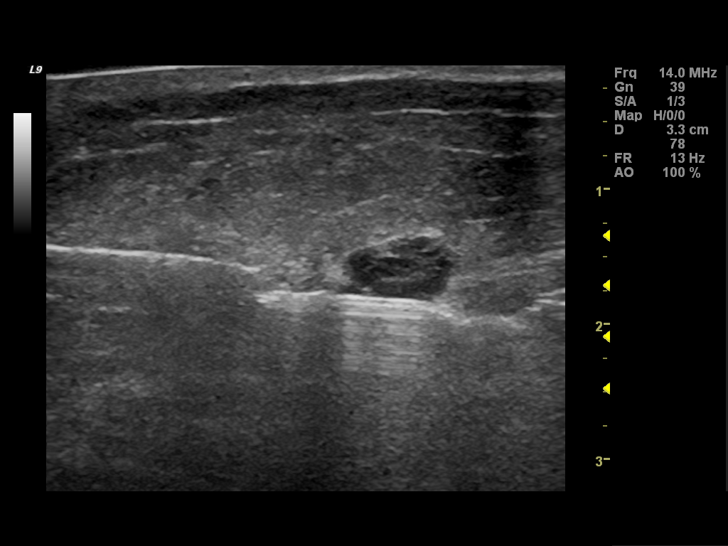
[im 3/10]
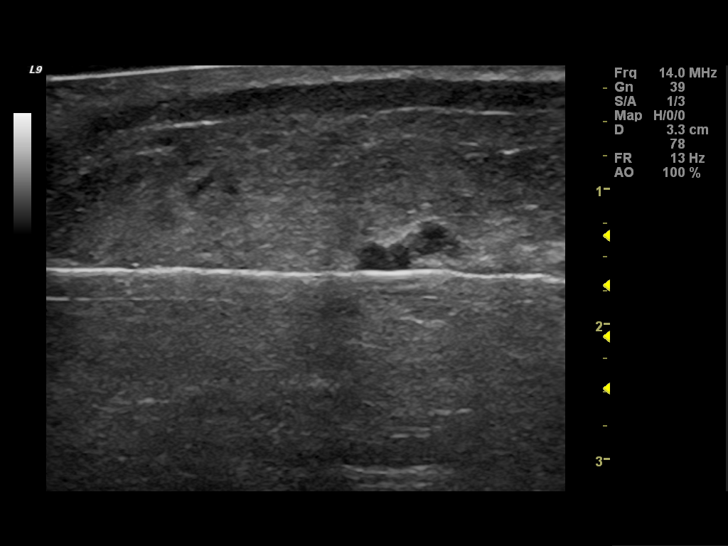
[im 4/10]
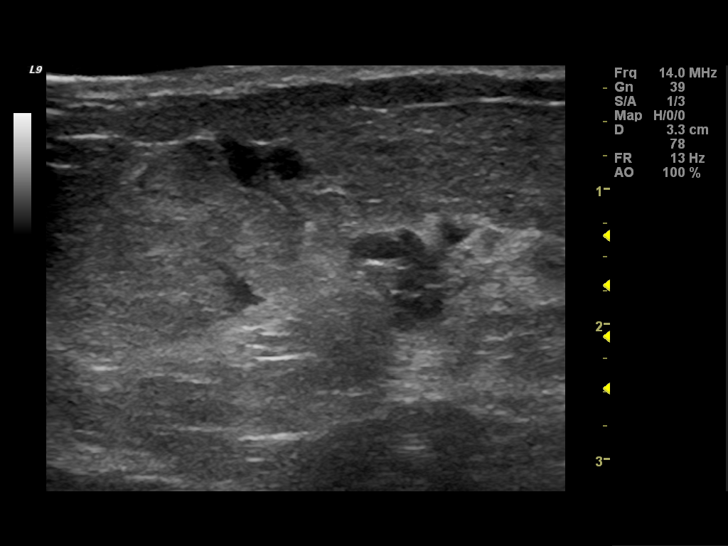
[im 5/10]
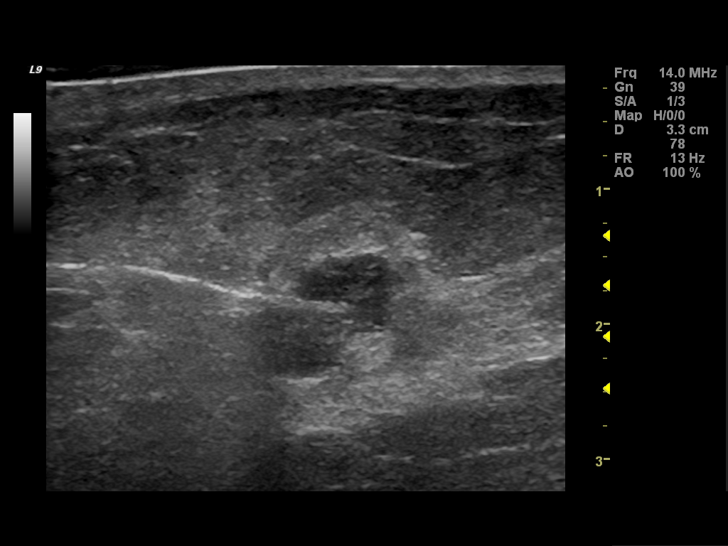
[im 6/10]
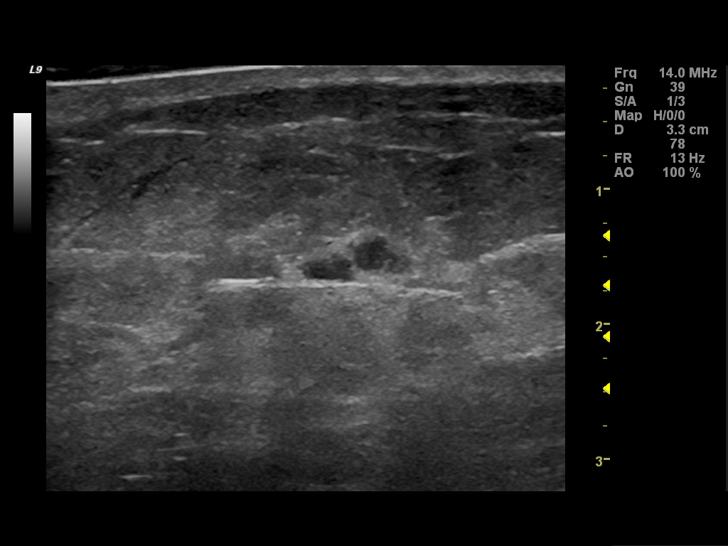
[im 7/10]
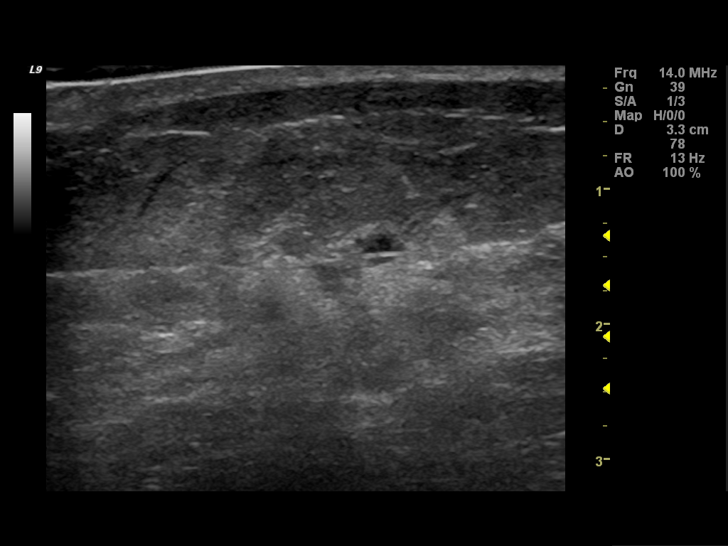
[im 8/10]
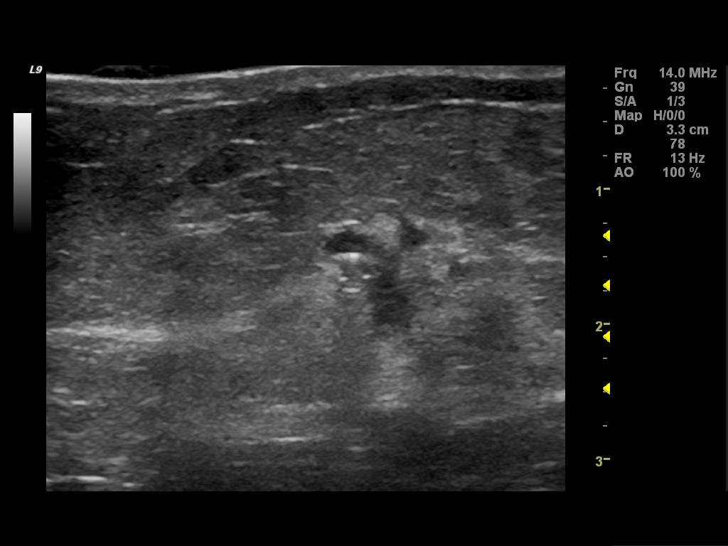
[im 9/10]
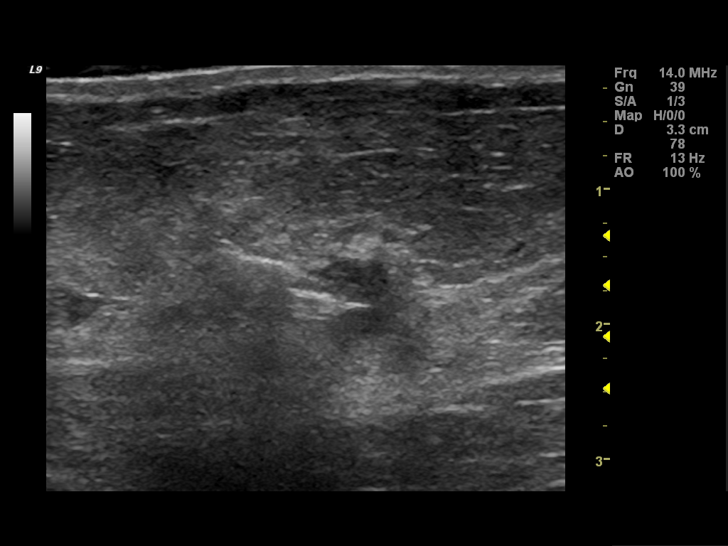
[im 10/10]
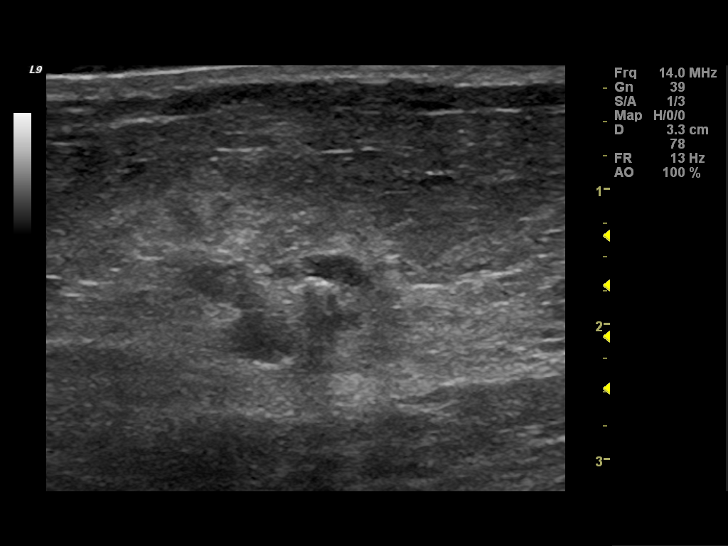

[10 of 10 positions shown; findings below may reference images not displayed]

I met with the patient and we discussed the procedure of ultrasound-
guided biopsy, including benefits and alternatives.  We discussed
the high likelihood of a successful procedure. We discussed the
risks of the procedure, including infection, bleeding, tissue
injury, clip migration, and inadequate sampling.  Informed, written
consent was given. Increased risk of bleeding due to use of 81 mg
aspirin per day was specifically discussed.

Using sterile technique, 2% lidocaine, ultrasound guidance, and a
12 gauge vacuum assisted needle, biopsy was performed of right
breast mass 12 o'clock location using a lateral to medial approach.
Two core samples were obtained.  At the conclusion of the
procedure, a tissue marker clip was deployed into the biopsy
cavity.  Follow-up 2-view mammogram was performed and dictated
separately.
IMPRESSION: Ultrasound-guided biopsy of right breast mass, 12 o'clock location,
with ribbon-shaped clip placement.  Pathology is pending.  No
apparent complications.

## 2012-06-13 IMAGING — MG MM DIAGNOSTIC UNILATERAL L
4 series · 4 of 4 positions shown · non-contrast
Comparison: Prior exams

CLINICAL DATA: Newly-diagnosed right breast cancer.  Pre MRI exam.

DIGITAL DIAGNOSTIC LEFT MAMMOGRAM WITH CAD
Mammographic images were processed with CAD.

[L CC]
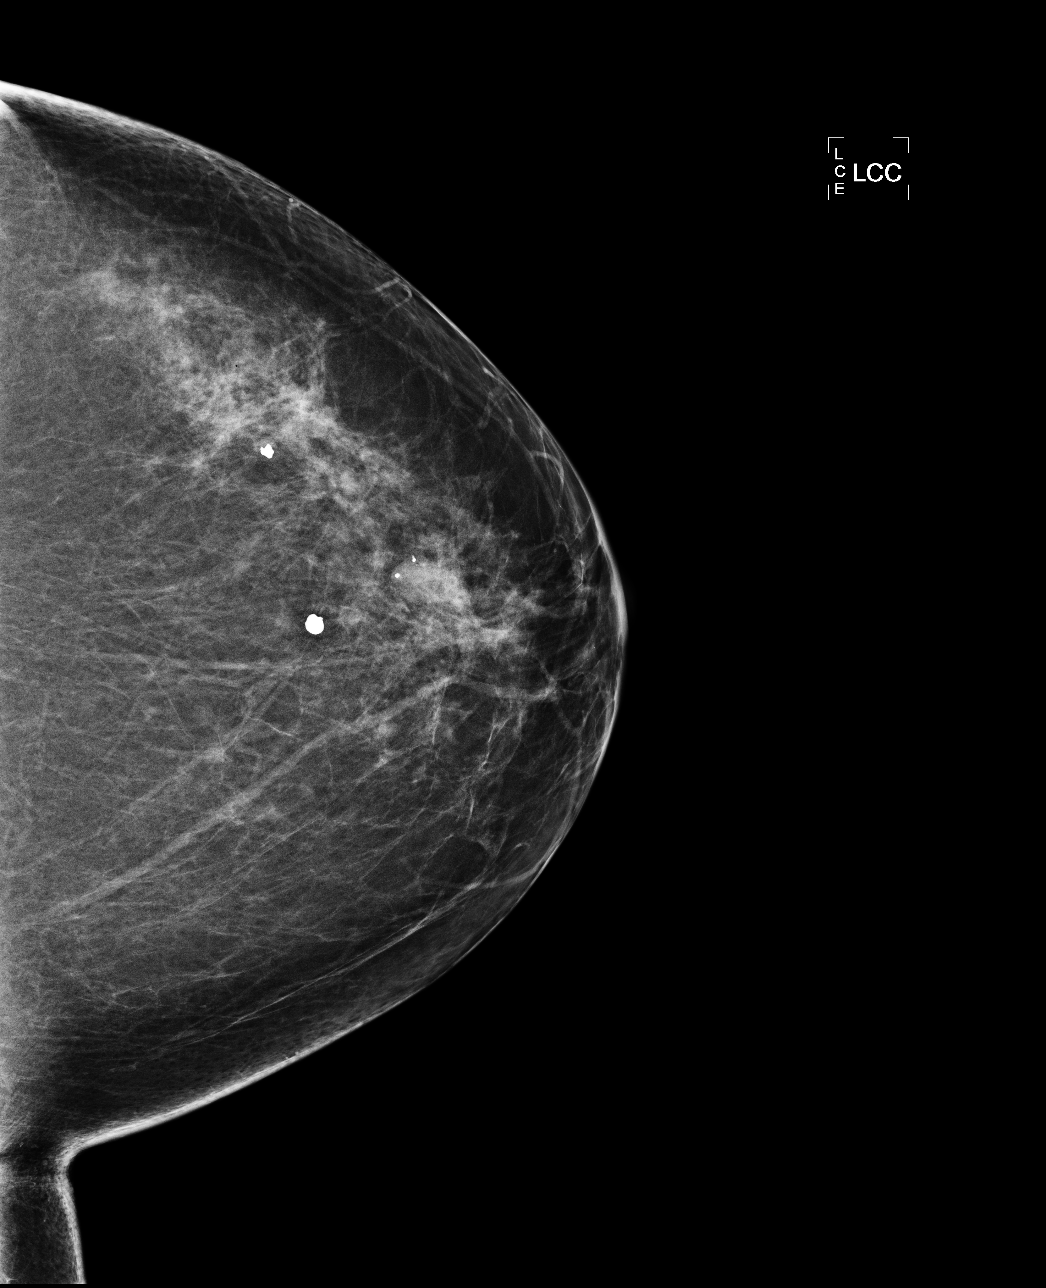

[L MLO (1 of 2)]
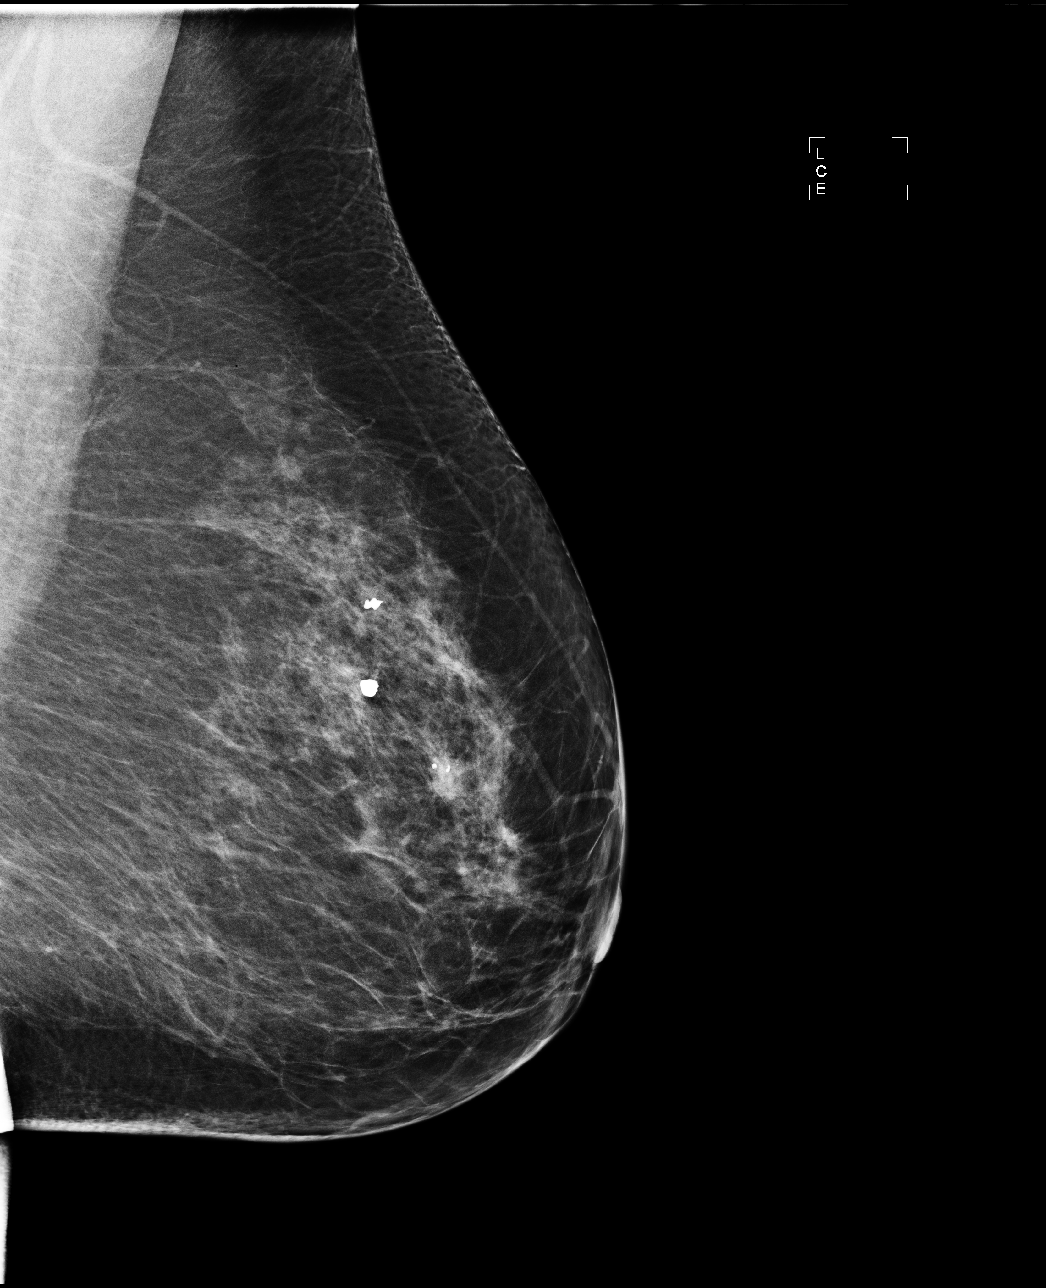

[L MLO (2 of 2)]
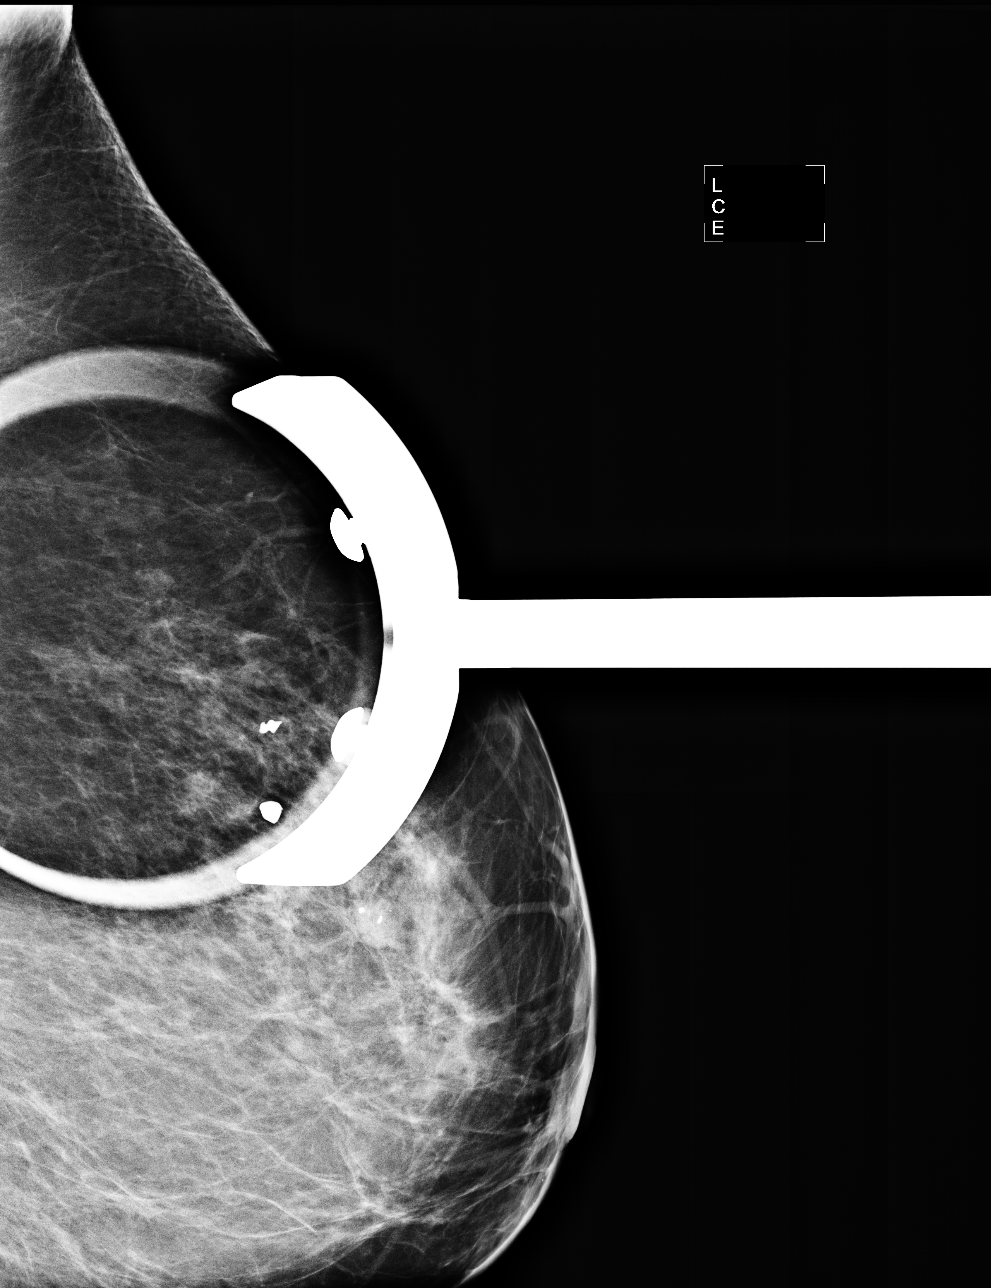

[L ML]
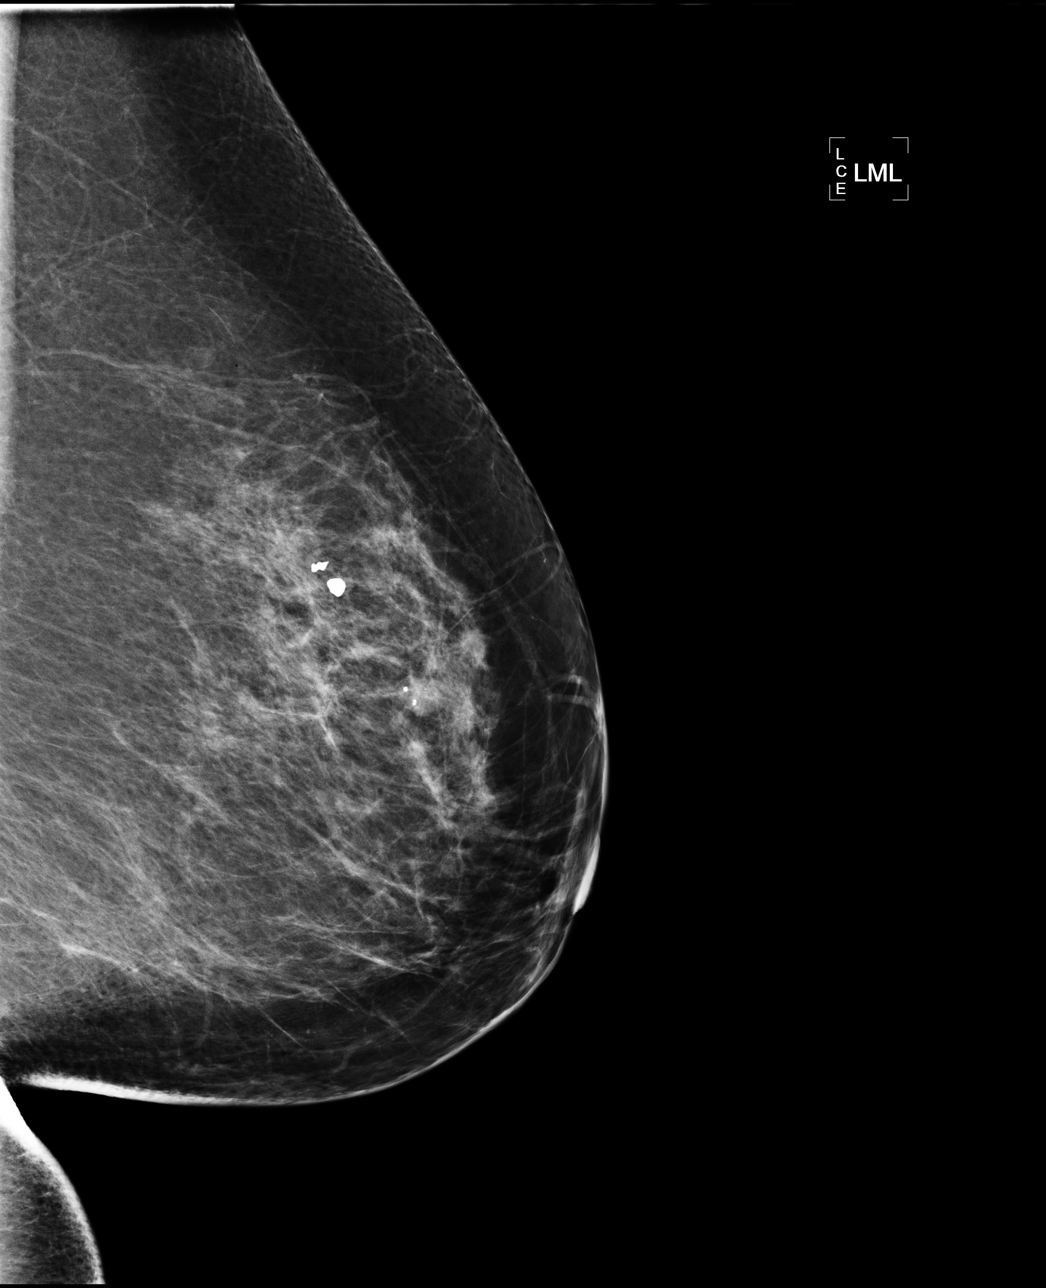

[4 of 4 positions shown; findings below may reference images not displayed]

FINDINGS: The breast parenchyma demonstrates scattered
fibroglandular densities.  No suspicious mass, calcification, or
architectural distortion is seen.  Areas of nodular breast
parenchyma are stable as are benign appearing calcifications most
typical for involuting fibroadenomata.
IMPRESSION: No evidence for malignancy in the left breast.  Findings and
recommendations discussed with the patient and provided in written
form at the time of the exam.

BI-RADS CATEGORY 1:  Negative.

Recommendation:  Treatment plan

## 2012-06-28 ENCOUNTER — Other Ambulatory Visit: Payer: Self-pay | Admitting: Obstetrics and Gynecology

## 2012-06-28 ENCOUNTER — Ambulatory Visit
Admission: RE | Admit: 2012-06-28 | Discharge: 2012-06-28 | Disposition: A | Discharge status: Home / Self Care | Payer: Medicare Other | Source: Ambulatory Visit | Attending: Oncology | Admitting: Oncology

## 2012-06-28 DIAGNOSIS — Z853 Personal history of malignant neoplasm of breast: Secondary | ICD-10-CM | POA: Diagnosis not present

## 2012-06-28 DIAGNOSIS — N6489 Other specified disorders of breast: Secondary | ICD-10-CM | POA: Diagnosis not present

## 2012-07-24 ENCOUNTER — Ambulatory Visit: Payer: Medicare Other | Admitting: Oncology

## 2012-07-25 ENCOUNTER — Encounter: Payer: Self-pay | Admitting: Oncology

## 2012-07-25 ENCOUNTER — Other Ambulatory Visit (HOSPITAL_BASED_OUTPATIENT_CLINIC_OR_DEPARTMENT_OTHER): Payer: Medicare Other | Admitting: Lab

## 2012-07-25 ENCOUNTER — Ambulatory Visit (HOSPITAL_BASED_OUTPATIENT_CLINIC_OR_DEPARTMENT_OTHER): Payer: Medicare Other | Admitting: Oncology

## 2012-07-25 ENCOUNTER — Telehealth: Payer: Self-pay | Admitting: *Deleted

## 2012-07-25 VITALS — BP 108/72 | HR 62 | Temp 98.7°F | Resp 20 | Ht 60.25 in | Wt 185.2 lb

## 2012-07-25 DIAGNOSIS — C50411 Malignant neoplasm of upper-outer quadrant of right female breast: Secondary | ICD-10-CM

## 2012-07-25 DIAGNOSIS — C50419 Malignant neoplasm of upper-outer quadrant of unspecified female breast: Secondary | ICD-10-CM | POA: Diagnosis not present

## 2012-07-25 LAB — CBC WITH DIFFERENTIAL/PLATELET
BASO%: 1.5 % (ref 0.0–2.0)
EOS%: 2.9 % (ref 0.0–7.0)
LYMPH%: 27.9 % (ref 14.0–49.7)
MCH: 30.8 pg (ref 25.1–34.0)
MCHC: 33.8 g/dL (ref 31.5–36.0)
MONO#: 0.4 10*3/uL (ref 0.1–0.9)
MONO%: 9.2 % (ref 0.0–14.0)
Platelets: 173 10*3/uL (ref 145–400)
RBC: 4.76 10*6/uL (ref 3.70–5.45)
WBC: 4.9 10*3/uL (ref 3.9–10.3)

## 2012-07-25 LAB — COMPREHENSIVE METABOLIC PANEL (CC13)
ALT: 45 U/L (ref 0–55)
AST: 32 U/L (ref 5–34)
Alkaline Phosphatase: 109 U/L (ref 40–150)
CO2: 22 mEq/L (ref 22–29)
Sodium: 139 mEq/L (ref 136–145)
Total Bilirubin: 0.45 mg/dL (ref 0.20–1.20)
Total Protein: 6.8 g/dL (ref 6.4–8.3)

## 2012-07-25 MED ORDER — ANASTROZOLE 1 MG PO TABS: 1.0000 mg | ORAL_TABLET | Freq: Every day | ORAL | Status: DC

## 2012-07-25 NOTE — Progress Notes (Signed)
OFFICE PROGRESS NOTE  CC  Garlan Fillers, MD [address] [facility], [place]. [place] [address] Dr. Lonie Peak Dr. 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  PRIOR THERAPY:   #1 patient was originally seen at the multidisciplinary breast clinic with new diagnosis of right breast mass. She has since gone on to have a right breast lumpectomy.  #2 the pathology showed a 1.7 cm invasive ductal carcinoma grade 3 without LV I but associated ductal carcinoma in situ. One sentinel node was negative for metastatic disease.the tumor was ER +100% PR +100% Ki-67 50% and HER-2/neu negative  #3 patient had an Oncotype DX testing done that showed a breast cancer recurrence score of 9 giving her a 5 year risk of distant recurrence of 6%   #4.Patient is now status post radiation therapy to the right breast. She completed this on 09/23/2011  #4 patient is on anastrozole 1 mg daily starting 10/24/2011.A total of 5 years of therapy is planned.  CURRENT THERAPY: Anastrozole 1 mg  Po daily  INTERVAL HISTORY: Joy Gonzales 71 y.o. female returns for followup visit. Patient feels well overall. She is tolerating Arimidex without any significant problems. She does experience occasional hot flashes. She has no night sweats. No nausea or vomiting no headaches double vision blurring of vision no shortness of breath no chest pains no lower extremity swelling no vaginal bleeding or discharge. No back pain no peripheral paresthesias. No myalgias and arthralgias. No dizziness. Remainder of the 10 point review of systems is negative.    MEDICAL HISTORY: Past Medical History  Diagnosis Date  . HTN (hypertension)   . GERD (gastroesophageal reflux disease)   . Hyperlipemia   . Elevated LFTs   . Tinnitus   . Diverticulosis   .  Vocal cord granuloma   . IBS (irritable bowel syndrome)   . Rectocele   . Lichen sclerosus   . Atrophic vaginitis   . HSV-1 infection   . Osteopenia   . Flushing   . Dyspnea     on exertion  . PONV (postoperative nausea and vomiting)   . Bladder cancer   . Breast cancer   . S/P radiation therapy 08/24/11 - 10/05/11    Right Breast: 50 Gy/25 Fractions with boost of 10Gy/5 Fractions  . Use of anastrozole (Arimidex) 10/25/11    ALLERGIES:  is allergic to adhesive and felodipine er.  MEDICATIONS:  Current Outpatient Prescriptions  Medication Sig Dispense Refill  . ALPRAZolam (XANAX) 0.25 MG tablet Take 0.125 mg by mouth daily as needed. Take infrequently      . anastrozole (ARIMIDEX) 1 MG tablet Take 1 mg by mouth daily.      Marland Kitchen aspirin 81 MG tablet Take 81 mg by mouth daily.        . cholecalciferol (VITAMIN D) 400 UNITS TABS Take 1,000 Units by mouth.       . esomeprazole (NEXIUM) 40 MG capsule Take 40 mg by mouth daily.        . fenofibrate micronized (LOFIBRA) 200 MG capsule Take 200 mg by mouth daily.        . fexofenadine (ALLEGRA) 180 MG tablet Take 180 mg by mouth as needed.        . metoprolol succinate (TOPROL XL) 25 MG 24 hr tablet Take 25 mg by mouth daily.        Marland Kitchen  olmesartan-hydrochlorothiazide (BENICAR HCT) 20-12.5 MG per tablet Take 1 tablet by mouth daily.      . ranitidine (ZANTAC) 75 MG tablet Take 75 mg by mouth at bedtime.       . valACYclovir (VALTREX) 500 MG tablet Take 1 tablet (500 mg total) by mouth 2 (two) times daily. When she has a fever blister only  30 tablet  2  . anastrozole (ARIMIDEX) 1 MG tablet Take 1 tablet (1 mg total) by mouth daily.  30 tablet  12  . calcium carbonate (TITRALAC) 420 MG CHEW Chew 420 mg by mouth.       No current facility-administered medications for this visit.    SURGICAL HISTORY:  Past Surgical History  Procedure Laterality Date  . Tonsillectomy and adenoidectomy    . Throat surgery      LARYN. GRANULOMA  POLYP  .  Cholecystectomy    . Bladder tumor excision    . Breast tumor removed    . Hysteroscopy w/d&c  05/18/2011    Procedure: DILATATION AND CURETTAGE /HYSTEROSCOPY;  Surgeon: Trellis Paganini, MD;  Location: [facility];  Service: Gynecology;  Laterality: N/A;  or Tuesday, March 12 7:30am? or Thursday, March 14 7:30am? or Tuesday March 19 7:30am?  . Dilation and curettage of uterus    . Hysteroscopy    . Needle core biospy  06/20/2011    Right Breast, 12'Oclock - Invasive Mammary Carcinoma with Lymphovascular Invasion: ER/PR 100%, Ki67 50%  . Breast lumpectomy  07/05/2011    Right Breast- Invasive Ductal , Grade III, ducatal Carrcinoma  In-situ; 0/1 Node Negative    REVIEW OF SYSTEMS:  Pertinent items are noted in HPI.   PHYSICAL EXAMINATION: General appearance: alert, cooperative and appears stated age Lymph nodes: Cervical, supraclavicular, and axillary nodes normal. Resp: clear to auscultation bilaterally and normal percussion bilaterally Cardio: regular rate and rhythm, S1, S2 normal, no murmur, click, rub or gallop GI: soft, non-tender; bowel sounds normal; no masses,  no organomegaly Extremities: extremities normal, atraumatic, no cyanosis or edema Neurologic: Grossly normal Right breast exam reveals well-healed incisional scar as does the axilla there is no evidence of infections. No nipple discharge. Left breast no masses or nipple discharge.  ECOG PERFORMANCE STATUS: 0 - Asymptomatic  Blood pressure 108/72, pulse 62, temperature 98.7 F (37.1 C), temperature source Oral, resp. rate 20, height 5' 0.25" (1.53 m), weight 185 lb 3.2 oz (84.006 kg), last menstrual period 03/13/1990.  LABORATORY DATA: Lab Results  Component Value Date   WBC 4.9 07/25/2012   HGB 14.7 07/25/2012   HCT 43.4 07/25/2012   MCV 91.2 07/25/2012   PLT 173 07/25/2012      Chemistry      Component Value Date/Time   NA 139 07/25/2012 1001   NA 143 08/21/2011 1544   K 4.0 07/25/2012 1001   K 3.5 08/21/2011 1544   CL  108* 07/25/2012 1001   CL 105 08/21/2011 1544   CO2 22 07/25/2012 1001   CO2 28 08/21/2011 1544   BUN 14.0 07/25/2012 1001   BUN 15 08/21/2011 1544   CREATININE 0.8 07/25/2012 1001   CREATININE 0.78 08/21/2011 1544      Component Value Date/Time   CALCIUM 9.4 07/25/2012 1001   CALCIUM 9.4 08/21/2011 1544   ALKPHOS 109 07/25/2012 1001   ALKPHOS 85 08/21/2011 1544   AST 32 07/25/2012 1001   AST 32 08/21/2011 1544   ALT 45 07/25/2012 1001   ALT 45* 08/21/2011 1544   BILITOT 0.45  07/25/2012 1001   BILITOT 0.2* 08/21/2011 1544     Patient Name: RAYMOND, AZURE Accession #: [id number] DOB: [birth year] Age: 7 Gender: F Client Name [place] Collected Date: 07/05/2011 Received Date: 07/05/2011 Physician: Glenna Fellows Chart #: MRN # : [id number] Physician cc: Kaylyn Lim, RN Christiana Pellant, MD Race:W Visit #: [id number] Edyth Gunnels, MD REPORT OF SURGICAL PATHOLOGY ADDITIONAL INFORMATION: 1. A sample (Block 1A) was sent to [facility] for Oncotype testing. The patient's recurrence score is 9. Those patients with a recurrence score of 9 had an average rate of distant recurrence of 6%. (JK:mw 08-01-11) Pecola Leisure MD Pathologist, Electronic Signature ( Signed 08/01/2011) 1. CHROMOGENIC IN-SITU HYBRIDIZATION Interpretation HER-2/NEU BY CISH - NO AMPLIFICATION OF HER-2 DETECTED. THE RATIO OF HER-2: CEP 17 SIGNALS WAS 1.21. Reference range: Ratio: HER2:CEP17 < 1.8 - gene amplification not observed Ratio: HER2:CEP 17 1.8-2.2 - equivocal result Ratio: HER2:CEP17 > 2.2 - gene amplification observed Pecola Leisure MD Pathologist, Electronic Signature ( Signed 07/11/2011) FINAL DIAGNOSIS 1 of 3 FINAL for Gonzales, Joy C ([id number]) Diagnosis 1. Breast, lumpectomy, Right - INVASIVE DUCTAL CARCINOMA, GRADE III (1.7 CM). - INVASIVE TUMOR IS 0.1MM FROM NEAREST MARGIN (ANTERIOR) - NO LYMPHOVASCULAR INVASION IDENTIFIED. - DUCTAL CARCINOMA IN SITU. - SEE TUMOR SYNOPTIC TEMPLATE  BELOW. 2. Lymph node, sentinel, biopsy, Right - ONE LYMPH NODE, NEGATIVE FOR TUMOR (0/1) SEE COMMENT Microscopic Comment 1. BREAST, INVASIVE TUMOR, WITH LYMPH NODE SAMPLING Specimen, including laterality: Right breast. Procedure: Lumpectomy. Grade: III of III Tubule formation: 2 Nuclear pleomorphism: 3 Mitotic:2 Tumor size (gross measurement): 1.7 cm Margins: Invasive, distance to closest margin: 0.22mm In-situ, distance to closest margin: 0.7cm If margin positive, focally or broadly: N/A Lymphovascular invasion: Absent Ductal carcinoma in situ: Present. Grade: II-III Extensive intraductal component: Absent. Lobular neoplasia: Absent. Tumor focality: Unifocal Treatment effect: None. If present, treatment effect in breast tissue, lymph nodes or both: None. Extent of tumor: Skin: N/A Nipple: N/A Skeletal muscle: N/A Lymph nodes: # examined: 1 Lymph nodes with metastasis: 0 Extracapsular extension: n/a Breast prognostic profile: Estrogen receptor: Not repeated, previous study demonstrated 100% positivity ([id number]) Progesterone receptor: Not repeated, previous study demonstrated 100% positivity ([id number]) Her 2 neu: Repeated, previous study demonstrated no amplification (1.21), ([id number]) Ki-67: Not repeated, previous study demonstrated 50% proliferation rate ([id number]). Non-neoplastic breast: Fibrocystic change and fibrosis. TNM: pT1c, pN0, pMX (CRR:gt, 07/07/11) 2. Cytokeratin Ae1/3 immunostain does not demonstrate any intranodal metastatic epithelial tumor deposits. There are hyalinized and calcified intra-nodal nodules present without necrosis. GMS stain is negative for yeast and fungi. [place] RUND DO Pathologist, Electronic Signature  RADIOGRAPHIC STUDIES:  No results found.  ASSESSMENT: 72 year old female with  #1 stage I (T1 C. 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 Tumor was ER +100% PR +100% HER-2/neu negative with Ki-67 of 50%. No evidence of recurrent disease.  #2 Oncotype testing revealed a distant recurrence of only 6%   #3 Patient is now status post radiation therapy to the right breast.  #4 patient receiving Arimidex 1 mg daily since August 2013.  #5 patient had a mammogram performed in April looks terrific.  PLAN:   #1 patient will continue Arimidex 1 mg daily overall she is tolerating it well.  #2 she is counseled to have yearly diagnostic mammograms.  #3 she will be seen back in 6 months time.  All questions were  answered. The patient knows to call the clinic with any problems, questions or concerns. We can certainly see the patient much sooner if necessary.  I spent 25 minutes counseling the patient face to face. The total time spent in the appointment was 30 minutes.

## 2012-07-25 NOTE — Telephone Encounter (Signed)
appts made and printed...td 

## 2012-07-25 NOTE — Patient Instructions (Addendum)
You are continuing to do very well there is no clinical evidence of recurrent disease  Mammogram looks terrific no evidence of cancer in the breast.  Arimidex prescription was sent to your pharmacy  I will see you back in October 2014

## 2012-09-04 ENCOUNTER — Encounter (INDEPENDENT_AMBULATORY_CARE_PROVIDER_SITE_OTHER): Payer: Self-pay | Admitting: General Surgery

## 2012-09-19 DIAGNOSIS — R82998 Other abnormal findings in urine: Secondary | ICD-10-CM | POA: Diagnosis not present

## 2012-09-19 DIAGNOSIS — D3000 Benign neoplasm of unspecified kidney: Secondary | ICD-10-CM | POA: Diagnosis not present

## 2012-09-19 DIAGNOSIS — N393 Stress incontinence (female) (male): Secondary | ICD-10-CM | POA: Diagnosis not present

## 2012-09-19 DIAGNOSIS — Z8551 Personal history of malignant neoplasm of bladder: Secondary | ICD-10-CM | POA: Diagnosis not present

## 2012-10-16 ENCOUNTER — Other Ambulatory Visit: Payer: Self-pay

## 2012-10-31 ENCOUNTER — Ambulatory Visit (INDEPENDENT_AMBULATORY_CARE_PROVIDER_SITE_OTHER): Payer: Medicare Other | Admitting: General Surgery

## 2012-10-31 VITALS — BP 128/84 | HR 78 | Temp 97.4°F | Resp 16 | Ht 62.5 in | Wt 190.6 lb

## 2012-10-31 DIAGNOSIS — C50419 Malignant neoplasm of upper-outer quadrant of unspecified female breast: Secondary | ICD-10-CM | POA: Diagnosis not present

## 2012-10-31 DIAGNOSIS — C50411 Malignant neoplasm of upper-outer quadrant of right female breast: Secondary | ICD-10-CM

## 2012-10-31 NOTE — Progress Notes (Signed)
Chief complaint: Followup breast cancer  History: Patient returns for  long-term followup status post lumpectomy and sentinel lymph node biopsy, radiation, and now on adjuvant Arimidex for T1 C. N0 cancer of the right breast with surgery in May of 2013. She reports she got from her radiation treatment without any difficulties and is tolerating her hormonal treatment with minimal if any side effects. She is very happy with the way she feels. She has not noted any recent changes in her breast.  Specifically no skin changes or nipple discharge or current mass that she is worried about. No unusual pain.  Exam: BP 128/84  Pulse 78  Temp(Src) 97.4 F (36.3 C) (Temporal)  Resp 16  Ht 5' 2.5" (1.588 m)  Wt 190 lb 9.6 oz (86.456 kg)  BMI 34.28 kg/m2  LMP 03/13/1990 General: Mildly overweight but well-appearing Caucasian female Lymph nodes: No cervical, supraclavicular, or axillary nodes palpable Breasts: There are mild post radiation changes of the right breast. Lumpectomy site well healed without thickening or mass. No other masses in either breast. Lungs: Clear equal breath sounds  Assessment plan: Doing well without evidence of early recurrence or complication from treatment. Return in 6 months alternating with Dr. Park Breed.

## 2012-11-05 DIAGNOSIS — Z23 Encounter for immunization: Secondary | ICD-10-CM | POA: Diagnosis not present

## 2012-11-13 ENCOUNTER — Other Ambulatory Visit (HOSPITAL_COMMUNITY)
Admission: RE | Admit: 2012-11-13 | Discharge: 2012-11-13 | Disposition: A | Discharge status: Home / Self Care | Payer: Medicare Other | Source: Ambulatory Visit | Attending: Gynecology | Admitting: Gynecology

## 2012-11-13 ENCOUNTER — Ambulatory Visit (INDEPENDENT_AMBULATORY_CARE_PROVIDER_SITE_OTHER): Payer: Medicare Other | Admitting: Women's Health

## 2012-11-13 ENCOUNTER — Encounter: Payer: Self-pay | Admitting: Women's Health

## 2012-11-13 ENCOUNTER — Encounter: Payer: Medicare Other | Admitting: Gynecology

## 2012-11-13 VITALS — BP 130/86 | Ht 62.0 in | Wt 191.0 lb

## 2012-11-13 DIAGNOSIS — L940 Localized scleroderma [morphea]: Secondary | ICD-10-CM

## 2012-11-13 DIAGNOSIS — B009 Herpesviral infection, unspecified: Secondary | ICD-10-CM

## 2012-11-13 DIAGNOSIS — Z124 Encounter for screening for malignant neoplasm of cervix: Secondary | ICD-10-CM | POA: Insufficient documentation

## 2012-11-13 DIAGNOSIS — L900 Lichen sclerosus et atrophicus: Secondary | ICD-10-CM

## 2012-11-13 MED ORDER — CLOBETASOL PROPIONATE 0.05 % EX CREA: TOPICAL_CREAM | Freq: Two times a day (BID) | CUTANEOUS | Status: DC

## 2012-11-13 MED ORDER — VALACYCLOVIR HCL 500 MG PO TABS: 500.0000 mg | ORAL_TABLET | Freq: Two times a day (BID) | ORAL | Status: DC

## 2012-11-13 NOTE — Patient Instructions (Addendum)
Health Recommendations for Postmenopausal Women Respected and ongoing research has looked at the most common causes of death, disability, and poor quality of life in postmenopausal women. The causes include heart disease, diseases of blood vessels, diabetes, depression, cancer, and bone loss (osteoporosis). Many things can be done to help lower the chances of developing these and other common problems: CARDIOVASCULAR DISEASE Heart Disease: A heart attack is a medical emergency. Know the signs and symptoms of a heart attack. Below are things women can do to reduce their risk for heart disease.   Do not smoke. If you smoke, quit.  Aim for a healthy weight. Being overweight causes many preventable deaths. Eat a healthy and balanced diet and drink an adequate amount of liquids.  Get moving. Make a commitment to be more physically active. Aim for 30 minutes of activity on most, if not all days of the week.  Eat for heart health. Choose a diet that is low in saturated fat and cholesterol and eliminate trans fat. Include whole grains, vegetables, and fruits. Read and understand the labels on food containers before buying.  Know your numbers. Ask your caregiver to check your blood pressure, cholesterol (total, HDL, LDL, triglycerides) and blood glucose. Work with your caregiver on improving your entire clinical picture.  High blood pressure. Limit or stop your table salt intake (try salt substitute and food seasonings). Avoid salty foods and drinks. Read labels on food containers before buying. Eating well and exercising can help control high blood pressure. STROKE  Stroke is a medical emergency. Stroke may be the result of a blood clot in a blood vessel in the brain or by a brain hemorrhage (bleeding). Know the signs and symptoms of a stroke. To lower the risk of developing a stroke:  Avoid fatty foods.  Quit smoking.  Control your diabetes, blood pressure, and irregular heart rate. THROMBOPHLEBITIS  (BLOOD CLOT) OF THE LEG  Becoming overweight and leading a stationary lifestyle may also contribute to developing blood clots. Controlling your diet and exercising will help lower the risk of developing blood clots. CANCER SCREENING  Breast Cancer: Take steps to reduce your risk of breast cancer.  You should practice "breast self-awareness." This means understanding the normal appearance and feel of your breasts and should include breast self-examination. Any changes detected, no matter how small, should be reported to your caregiver.  After age 40, you should have a clinical breast exam (CBE) every year.  Starting at age 40, you should consider having a mammogram (breast X-ray) every year.  If you have a family history of breast cancer, talk to your caregiver about genetic screening.  If you are at high risk for breast cancer, talk to your caregiver about having an MRI and a mammogram every year.  Intestinal or Stomach Cancer: Tests to consider are a rectal exam, fecal occult blood, sigmoidoscopy, and colonoscopy. Women who are high risk may need to be screened at an earlier age and more often.  Cervical Cancer:  Beginning at age 30, you should have a Pap test every 3 years as long as the past 3 Pap tests have been normal.  If you have had past treatment for cervical cancer or a condition that could lead to cancer, you need Pap tests and screening for cancer for at least 20 years after your treatment.  If you had a hysterectomy for a problem that was not cancer or a condition that could lead to cancer, then you no longer need Pap tests.    If you are between ages 65 and 70, and you have had normal Pap tests going back 10 years, you no longer need Pap tests.  If Pap tests have been discontinued, risk factors (such as a new sexual partner) need to be reassessed to determine if screening should be resumed.  Some medical problems can increase the chance of getting cervical cancer. In these  cases, your caregiver may recommend more frequent screening and Pap tests.  Uterine Cancer: If you have vaginal bleeding after reaching menopause, you should notify your caregiver.  Ovarian cancer: Other than yearly pelvic exams, there are no reliable tests available to screen for ovarian cancer at this time except for yearly pelvic exams.  Lung Cancer: Yearly chest X-rays can detect lung cancer and should be done on high risk women, such as cigarette smokers and women with chronic lung disease (emphysema).  Skin Cancer: A complete body skin exam should be done at your yearly examination. Avoid overexposure to the sun and ultraviolet light lamps. Use a strong sun block cream when in the sun. All of these things are important in lowering the risk of skin cancer. MENOPAUSE Menopause Symptoms: Hormone therapy products are effective for treating symptoms associated with menopause:  Moderate to severe hot flashes.  Night sweats.  Mood swings.  Headaches.  Tiredness.  Loss of sex drive.  Insomnia.  Other symptoms. Hormone replacement carries certain risks, especially in older women. Women who use or are thinking about using estrogen or estrogen with progestin treatments should discuss that with their caregiver. Your caregiver will help you understand the benefits and risks. The ideal dose of hormone replacement therapy is not known. The Food and Drug Administration (FDA) has concluded that hormone therapy should be used only at the lowest doses and for the shortest amount of time to reach treatment goals.  OSTEOPOROSIS Protecting Against Bone Loss and Preventing Fracture: If you use hormone therapy for prevention of bone loss (osteoporosis), the risks for bone loss must outweigh the risk of the therapy. Ask your caregiver about other medications known to be safe and effective for preventing bone loss and fractures. To guard against bone loss or fractures, the following is recommended:  If  you are less than age 50, take 1000 mg of calcium and at least 600 mg of Vitamin D per day.  If you are greater than age 50 but less than age 70, take 1200 mg of calcium and at least 600 mg of Vitamin D per day.  If you are greater than age 70, take 1200 mg of calcium and at least 800 mg of Vitamin D per day. Smoking and excessive alcohol intake increases the risk of osteoporosis. Eat foods rich in calcium and vitamin D and do weight bearing exercises several times a week as your caregiver suggests. DIABETES Diabetes Melitus: If you have Type I or Type 2 diabetes, you should keep your blood sugar under control with diet, exercise and recommended medication. Avoid too many sweets, starchy and fatty foods. Being overweight can make control more difficult. COGNITION AND MEMORY Cognition and Memory: Menopausal hormone therapy is not recommended for the prevention of cognitive disorders such as Alzheimer's disease or memory loss.  DEPRESSION  Depression may occur at any age, but is common in elderly women. The reasons may be because of physical, medical, social (loneliness), or financial problems and needs. If you are experiencing depression because of medical problems and control of symptoms, talk to your caregiver about this. Physical activity and   exercise may help with mood and sleep. Community and volunteer involvement may help your sense of value and worth. If you have depression and you feel that the problem is getting worse or becoming severe, talk to your caregiver about treatment options that are best for you. ACCIDENTS  Accidents are common and can be serious in the elderly woman. Prepare your house to prevent accidents. Eliminate throw rugs, place hand bars in the bath, shower and toilet areas. Avoid wearing high heeled shoes or walking on wet, snowy, and icy areas. Limit or stop driving if you have vision or hearing problems, or you feel you are unsteady with you movements and  reflexes. HEPATITIS C Hepatitis C is a type of viral infection affecting the liver. It is spread mainly through contact with blood from an infected person. It can be treated, but if left untreated, it can lead to severe liver damage over years. Many people who are infected do not know that the virus is in their blood. If you are a "baby-boomer", it is recommended that you have one screening test for Hepatitis C. IMMUNIZATIONS  Several immunizations are important to consider having during your senior years, including:   Tetanus, diptheria, and pertussis booster shot.  Influenza every year before the flu season begins.  Pneumonia vaccine.  Shingles vaccine.  Others as indicated based on your specific needs. Talk to your caregiver about these. Document Released: 04/21/2005 Document Revised: 02/14/2012 Document Reviewed: 12/16/2007 Exit[employer] Patient Information 2014 [employer].  

## 2012-11-13 NOTE — Progress Notes (Signed)
Joy Gonzales May 31, 1940 [id number]    History:    The patient presents for breast and pelvic exam. 2013 invasive ductal carcinoma stage I, +ER, + PR, HER 2 negative right breast cancer - lumpectomy and radiation.  2000  bladder cancer, has an annual cystoscope, last cystoscope August 2014 normal. Primary care manages hypertension/hyperlipidemia. DEXA 11/2011 osteopenia, T score -2.1 spine, bilateral hip average -1.8, FRAX 10%/1.9%. Lichen sclerosis diagnosed on biopsy 1998 Dr. Eda Paschal. Normal Pap history, rare intercourse husbands health.   Past medical history, past surgical history, family history and social history were all reviewed and documented in the EPIC chart. Retired from [employer] officer, political party. Moved to [place] to be with children and grandchildren. Maternal grandmother breast cancer. Father hypertension. Sister diabetes.  Exam:  Filed Vitals:   11/13/12 1055  BP: 130/86    General appearance:  Normal Head/Neck:  Normal, without cervical or supraclavicular adenopathy. Thyroid:  Symmetrical, normal in size, without palpable masses or nodularity. Respiratory  Effort:  Normal  Auscultation:  Clear without wheezing or rhonchi Cardiovascular  Auscultation:  Regular rate, without rubs, murmurs or gallops  Edema/varicosities:  Not grossly evident Abdominal  Soft,nontender, without masses, guarding or rebound.  Liver/spleen:  No organomegaly noted  Hernia:  None appreciated  Skin  Inspection:  Grossly normal  Palpation:  Grossly normal Neurologic/psychiatric  Orientation:  Normal with appropriate conversation.  Mood/affect:  Normal  Genitourinary    Breasts: Examined lying and sitting.     Right: Well-healed incisional lying at areola    Left: Without masses, retractions, discharge or axillary adenopathy.   Inguinal/mons:  Normal without inguinal adenopathy  External genitalia:  Lichen sclerosis below clitoral head, half centimeter flat nodule perineum  BUS/Urethra/Skene's glands:   Normal  Bladder:  Normal  Vagina:  Atrophic   Cervix:  Normal  Uterus:  normal in size, shape and contour.  Midline and mobile  Adnexa/parametria:     Rt: Without masses or tenderness.   Lt: Without masses or tenderness.  Anus and perineum: Normal  Digital rectal exam: Normal sphincter tone without palpated masses or tenderness  Assessment/Plan:  72 y.o. MWF G2P2 for breast and pelvic exam with complaint of vaginal itching mostly external.  Lichen sclerosis Hypertension/IBS/reflex-primary care manages labs and meds Breast cancer 2013 lumpectomy with radiation on arimidex Osteopenia Obesity  Plan: Temovate twice daily for several weeks, return to office for recheck. SBE's, continue annual mammogram, calcium rich diet, vitamin D 2000 daily. Home safety, fall prevention, importance of exercise discussed. Current on Pneumovax, flu vaccine, recommended shingles vaccine. Pap, Pap normal 2012, new screening guidelines reviewed. Reviewed importance of decreasing calories and increasing exercise for weight loss.   Harrington Challenger WHNP, 1:06 PM 11/13/2012

## 2012-12-02 DIAGNOSIS — J069 Acute upper respiratory infection, unspecified: Secondary | ICD-10-CM | POA: Diagnosis not present

## 2012-12-02 DIAGNOSIS — R05 Cough: Secondary | ICD-10-CM | POA: Diagnosis not present

## 2012-12-02 DIAGNOSIS — I10 Essential (primary) hypertension: Secondary | ICD-10-CM | POA: Diagnosis not present

## 2012-12-02 DIAGNOSIS — R059 Cough, unspecified: Secondary | ICD-10-CM | POA: Diagnosis not present

## 2012-12-05 DIAGNOSIS — I10 Essential (primary) hypertension: Secondary | ICD-10-CM | POA: Diagnosis not present

## 2012-12-05 DIAGNOSIS — R82998 Other abnormal findings in urine: Secondary | ICD-10-CM | POA: Diagnosis not present

## 2012-12-05 DIAGNOSIS — E785 Hyperlipidemia, unspecified: Secondary | ICD-10-CM | POA: Diagnosis not present

## 2012-12-06 ENCOUNTER — Telehealth: Payer: Self-pay | Admitting: *Deleted

## 2012-12-06 NOTE — Telephone Encounter (Signed)
Pt informed with pap results on 11/13/12 OV.

## 2012-12-12 DIAGNOSIS — Z6834 Body mass index (BMI) 34.0-34.9, adult: Secondary | ICD-10-CM | POA: Diagnosis not present

## 2012-12-12 DIAGNOSIS — Z1331 Encounter for screening for depression: Secondary | ICD-10-CM | POA: Diagnosis not present

## 2012-12-12 DIAGNOSIS — J069 Acute upper respiratory infection, unspecified: Secondary | ICD-10-CM | POA: Diagnosis not present

## 2012-12-12 DIAGNOSIS — Z0000 Encounter for general adult medical examination without abnormal findings: Secondary | ICD-10-CM | POA: Diagnosis not present

## 2012-12-12 DIAGNOSIS — Z79899 Other long term (current) drug therapy: Secondary | ICD-10-CM | POA: Diagnosis not present

## 2012-12-12 DIAGNOSIS — I10 Essential (primary) hypertension: Secondary | ICD-10-CM | POA: Diagnosis not present

## 2012-12-12 DIAGNOSIS — E785 Hyperlipidemia, unspecified: Secondary | ICD-10-CM | POA: Diagnosis not present

## 2012-12-12 DIAGNOSIS — K219 Gastro-esophageal reflux disease without esophagitis: Secondary | ICD-10-CM | POA: Diagnosis not present

## 2012-12-12 DIAGNOSIS — E669 Obesity, unspecified: Secondary | ICD-10-CM | POA: Diagnosis not present

## 2012-12-13 ENCOUNTER — Encounter: Payer: Self-pay | Admitting: Women's Health

## 2012-12-13 ENCOUNTER — Ambulatory Visit (INDEPENDENT_AMBULATORY_CARE_PROVIDER_SITE_OTHER): Payer: Medicare Other | Admitting: Women's Health

## 2012-12-13 DIAGNOSIS — L940 Localized scleroderma [morphea]: Secondary | ICD-10-CM | POA: Diagnosis not present

## 2012-12-13 DIAGNOSIS — L900 Lichen sclerosus et atrophicus: Secondary | ICD-10-CM

## 2012-12-13 NOTE — Progress Notes (Signed)
Patient ID: Joy Gonzales, female   DOB: March 27, 1940, 72 y.o.   MRN: [id number] Presents for recheck of lichen sclerosis. Has been using Temovate and reports at least 50% improvement. Less itching and irritation. Had had a virus 2 weeks ago and is now improving from that. Denies urinary symptoms, vaginal discharge.  Exam: Appears well. External genitalia much improved with less irritation. Clitoral hood appears resolved, slight tenderness at  introitus that extends to perineum.   Lichen sclerosis  Plan: Temovate small amount weekly to help prevent recurrence, aware to prevent irritation. Instructed to call or return if further problems.

## 2012-12-17 ENCOUNTER — Telehealth: Payer: Self-pay | Admitting: Oncology

## 2012-12-17 NOTE — Telephone Encounter (Signed)
, °

## 2012-12-19 DIAGNOSIS — Z1212 Encounter for screening for malignant neoplasm of rectum: Secondary | ICD-10-CM | POA: Diagnosis not present

## 2012-12-24 ENCOUNTER — Other Ambulatory Visit: Payer: Medicare Other | Admitting: Lab

## 2012-12-24 ENCOUNTER — Ambulatory Visit: Payer: Medicare Other | Admitting: Oncology

## 2013-01-16 ENCOUNTER — Other Ambulatory Visit: Payer: Self-pay

## 2013-01-17 ENCOUNTER — Ambulatory Visit (HOSPITAL_BASED_OUTPATIENT_CLINIC_OR_DEPARTMENT_OTHER): Payer: Medicare Other | Admitting: Oncology

## 2013-01-17 ENCOUNTER — Encounter: Payer: Self-pay | Admitting: Oncology

## 2013-01-17 ENCOUNTER — Other Ambulatory Visit (HOSPITAL_BASED_OUTPATIENT_CLINIC_OR_DEPARTMENT_OTHER): Payer: Medicare Other | Admitting: Lab

## 2013-01-17 ENCOUNTER — Telehealth: Payer: Self-pay | Admitting: Oncology

## 2013-01-17 VITALS — BP 118/68 | HR 61 | Temp 98.8°F | Resp 20 | Ht 62.0 in | Wt 190.6 lb

## 2013-01-17 DIAGNOSIS — C50919 Malignant neoplasm of unspecified site of unspecified female breast: Secondary | ICD-10-CM | POA: Diagnosis not present

## 2013-01-17 DIAGNOSIS — C50411 Malignant neoplasm of upper-outer quadrant of right female breast: Secondary | ICD-10-CM

## 2013-01-17 DIAGNOSIS — E559 Vitamin D deficiency, unspecified: Secondary | ICD-10-CM | POA: Diagnosis not present

## 2013-01-17 DIAGNOSIS — Z170 Estrogen receptor positive status [ER+]: Secondary | ICD-10-CM

## 2013-01-17 LAB — COMPREHENSIVE METABOLIC PANEL (CC13)
ALT: 50 U/L (ref 0–55)
AST: 35 U/L — ABNORMAL HIGH (ref 5–34)
Albumin: 3.7 g/dL (ref 3.5–5.0)
BUN: 14.8 mg/dL (ref 7.0–26.0)
Calcium: 9.6 mg/dL (ref 8.4–10.4)
Chloride: 107 mEq/L (ref 98–109)
Potassium: 3.8 mEq/L (ref 3.5–5.1)

## 2013-01-17 LAB — CBC WITH DIFFERENTIAL/PLATELET
BASO%: 1.8 % (ref 0.0–2.0)
Basophils Absolute: 0.1 10*3/uL (ref 0.0–0.1)
EOS%: 3.2 % (ref 0.0–7.0)
HGB: 14.3 g/dL (ref 11.6–15.9)
MCH: 30.7 pg (ref 25.1–34.0)
MONO#: 0.6 10*3/uL (ref 0.1–0.9)
RDW: 12.8 % (ref 11.2–14.5)
WBC: 5.1 10*3/uL (ref 3.9–10.3)
lymph#: 1.6 10*3/uL (ref 0.9–3.3)

## 2013-01-17 NOTE — Telephone Encounter (Signed)
, °

## 2013-01-17 NOTE — Progress Notes (Signed)
OFFICE PROGRESS NOTE  CC  Garlan Fillers, MD [address] [facility], [place]. [place] [address] Dr. Lonie Peak Dr. 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  PRIOR THERAPY:   #1 patient was originally seen at the multidisciplinary breast clinic with new diagnosis of right breast mass. She has since gone on to have a right breast lumpectomy.  #2 the pathology showed a 1.7 cm invasive ductal carcinoma grade 3 without LV I but associated ductal carcinoma in situ. One sentinel node was negative for metastatic disease.the tumor was ER +100% PR +100% Ki-67 50% and HER-2/neu negative  #3 patient had an Oncotype DX testing done that showed a breast cancer recurrence score of 9 giving her a 5 year risk of distant recurrence of 6%   #4.Patient is now status post radiation therapy to the right breast. She completed this on 09/23/2011  #4 patient is on anastrozole 1 mg daily starting 10/24/2011.A total of 5 years of therapy is planned.  CURRENT THERAPY:  Curative intent Anastrozole 1 mg  Po daily  INTERVAL HISTORY: Joy Gonzales 72 y.o. female returns for followup visit. Patient feels well overall. She is tolerating Arimidex without any significant problems. She does experience occasional hot flashes. She has no night sweats. No nausea or vomiting no headaches double vision blurring of vision no shortness of breath no chest pains no lower extremity swelling no vaginal bleeding or discharge. No back pain no peripheral paresthesias. No myalgias and arthralgias. No dizziness. Remainder of the 10 point review of systems is negative.    MEDICAL HISTORY: Past Medical History  Diagnosis Date  . HTN (hypertension)   . GERD (gastroesophageal reflux disease)   . Hyperlipemia   . Elevated LFTs   . Tinnitus   .  Diverticulosis   . Vocal cord granuloma   . IBS (irritable bowel syndrome)   . Rectocele   . Lichen sclerosus   . Atrophic vaginitis   . HSV-1 infection   . Osteopenia   . Flushing   . Dyspnea     on exertion  . PONV (postoperative nausea and vomiting)   . Bladder cancer   . Breast cancer   . S/P radiation therapy 08/24/11 - 10/05/11    Right Breast: 50 Gy/25 Fractions with boost of 10Gy/5 Fractions  . Use of anastrozole (Arimidex) 10/25/11    ALLERGIES:  is allergic to adhesive and felodipine er.  MEDICATIONS:  Current Outpatient Prescriptions  Medication Sig Dispense Refill  . ALPRAZolam (XANAX) 0.25 MG tablet Take 0.125 mg by mouth daily as needed. Take infrequently      . anastrozole (ARIMIDEX) 1 MG tablet Take 1 tablet (1 mg total) by mouth daily.  90 tablet  12  . aspirin 81 MG tablet Take 81 mg by mouth daily.        . calcium carbonate (TITRALAC) 420 MG CHEW Chew 420 mg by mouth.      . cholecalciferol (VITAMIN D) 400 UNITS TABS Take 1,000 Units by mouth.       . esomeprazole (NEXIUM) 40 MG capsule Take 40 mg by mouth daily.        . fenofibrate micronized (LOFIBRA) 200 MG capsule Take 200 mg by mouth daily.        . metoprolol succinate (TOPROL XL) 25 MG 24 hr tablet Take 25 mg by mouth  daily.        . Multiple Vitamin (MULTI-VITAMIN DAILY PO) Take 1 tablet by mouth daily.      Marland Kitchen olmesartan-hydrochlorothiazide (BENICAR HCT) 20-12.5 MG per tablet Take 1 tablet by mouth daily.      . ranitidine (ZANTAC) 75 MG tablet Take 75 mg by mouth at bedtime.       . clobetasol cream (TEMOVATE) 0.05 % Apply topically 2 (two) times daily.  45 g  1  . fexofenadine (ALLEGRA) 180 MG tablet Take 180 mg by mouth as needed.        . valACYclovir (VALTREX) 500 MG tablet Take 1 tablet (500 mg total) by mouth 2 (two) times daily. When she has a fever blister only  30 tablet  2   No current facility-administered medications for this visit.    SURGICAL HISTORY:  Past Surgical History   Procedure Laterality Date  . Tonsillectomy and adenoidectomy    . Throat surgery      LARYN. GRANULOMA  POLYP  . Cholecystectomy    . Bladder tumor excision    . Breast tumor removed    . Hysteroscopy w/d&c  05/18/2011    Procedure: DILATATION AND CURETTAGE /HYSTEROSCOPY;  Surgeon: Trellis Paganini, MD;  Location: [facility];  Service: Gynecology;  Laterality: N/A;  or Tuesday, March 12 7:30am? or Thursday, March 14 7:30am? or Tuesday March 19 7:30am?  . Dilation and curettage of uterus    . Hysteroscopy    . Needle core biospy  06/20/2011    Right Breast, 12'Oclock - Invasive Mammary Carcinoma with Lymphovascular Invasion: ER/PR 100%, Ki67 50%  . Breast lumpectomy  07/05/2011    Right Breast- Invasive Ductal , Grade III, ducatal Carrcinoma  In-situ; 0/1 Node Negative , 30 ROUNDS OF RADIATION IN 2013    REVIEW OF SYSTEMS:  Pertinent items are noted in HPI.   PHYSICAL EXAMINATION: General appearance: alert, cooperative and appears stated age Lymph nodes: Cervical, supraclavicular, and axillary nodes normal. Resp: clear to auscultation bilaterally and normal percussion bilaterally Cardio: regular rate and rhythm, S1, S2 normal, no murmur, click, rub or gallop GI: soft, non-tender; bowel sounds normal; no masses,  no organomegaly Extremities: extremities normal, atraumatic, no cyanosis or edema Neurologic: Grossly normal Right breast exam reveals well-healed incisional scar as does the axilla there is no evidence of infections. No nipple discharge. Left breast no masses or nipple discharge.  ECOG PERFORMANCE STATUS: 0 - Asymptomatic  Blood pressure 118/68, pulse 61, temperature 98.8 F (37.1 C), temperature source Oral, resp. rate 20, height 5\' 2"  (1.575 m), weight 190 lb 9.6 oz (86.456 kg), last menstrual period 03/13/1990.  LABORATORY DATA: Lab Results  Component Value Date   WBC 5.1 01/17/2013   HGB 14.3 01/17/2013   HCT 42.8 01/17/2013   MCV 91.9 01/17/2013   PLT 180 01/17/2013       Chemistry      Component Value Date/Time   NA 141 01/17/2013 1003   NA 143 08/21/2011 1544   K 3.8 01/17/2013 1003   K 3.5 08/21/2011 1544   CL 108* 07/25/2012 1001   CL 105 08/21/2011 1544   CO2 23 01/17/2013 1003   CO2 28 08/21/2011 1544   BUN 14.8 01/17/2013 1003   BUN 15 08/21/2011 1544   CREATININE 0.8 01/17/2013 1003   CREATININE 0.78 08/21/2011 1544      Component Value Date/Time   CALCIUM 9.6 01/17/2013 1003   CALCIUM 9.4 08/21/2011 1544   ALKPHOS 106 01/17/2013 1003  ALKPHOS 85 08/21/2011 1544   AST 35* 01/17/2013 1003   AST 32 08/21/2011 1544   ALT 50 01/17/2013 1003   ALT 45* 08/21/2011 1544   BILITOT 0.47 01/17/2013 1003   BILITOT 0.2* 08/21/2011 1544     Patient Name: Joy Gonzales, Joy Gonzales Accession #: [id number] DOB: 02/15/1941 Age: 62 Gender: F Client Name [facility] Collected Date: 07/05/2011 Received Date: 07/05/2011 Physician: Glenna Fellows Chart #: MRN # : [id number] Physician cc: Kaylyn Lim, RN Christiana Pellant, MD Race:W Visit #: [id number] Edyth Gunnels, MD REPORT OF SURGICAL PATHOLOGY ADDITIONAL INFORMATION: 1. A sample (Block 1A) was sent to [facility] for Oncotype testing. The patient's recurrence score is 9. Those patients with a recurrence score of 9 had an average rate of distant recurrence of 6%. (JK:mw 08-01-11) Pecola Leisure MD Pathologist, Electronic Signature ( Signed 08/01/2011) 1. CHROMOGENIC IN-SITU HYBRIDIZATION Interpretation HER-2/NEU BY CISH - NO AMPLIFICATION OF HER-2 DETECTED. THE RATIO OF HER-2: CEP 17 SIGNALS WAS 1.21. Reference range: Ratio: HER2:CEP17 < 1.8 - gene amplification not observed Ratio: HER2:CEP 17 1.8-2.2 - equivocal result Ratio: HER2:CEP17 > 2.2 - gene amplification observed Pecola Leisure MD Pathologist, Electronic Signature ( Signed 07/11/2011) FINAL DIAGNOSIS 1 of 3 FINAL for Olesen, Makaylee C ([id number]) Diagnosis 1. Breast, lumpectomy, Right - INVASIVE DUCTAL CARCINOMA, GRADE III (1.7 CM). - INVASIVE  TUMOR IS 0.1MM FROM NEAREST MARGIN (ANTERIOR) - NO LYMPHOVASCULAR INVASION IDENTIFIED. - DUCTAL CARCINOMA IN SITU. - SEE TUMOR SYNOPTIC TEMPLATE BELOW. 2. Lymph node, sentinel, biopsy, Right - ONE LYMPH NODE, NEGATIVE FOR TUMOR (0/1) SEE COMMENT Microscopic Comment 1. BREAST, INVASIVE TUMOR, WITH LYMPH NODE SAMPLING Specimen, including laterality: Right breast. Procedure: Lumpectomy. Grade: III of III Tubule formation: 2 Nuclear pleomorphism: 3 Mitotic:2 Tumor size (gross measurement): 1.7 cm Margins: Invasive, distance to closest margin: 0.59mm In-situ, distance to closest margin: 0.7cm If margin positive, focally or broadly: N/A Lymphovascular invasion: Absent Ductal carcinoma in situ: Present. Grade: II-III Extensive intraductal component: Absent. Lobular neoplasia: Absent. Tumor focality: Unifocal Treatment effect: None. If present, treatment effect in breast tissue, lymph nodes or both: None. Extent of tumor: Skin: N/A Nipple: N/A Skeletal muscle: N/A Lymph nodes: # examined: 1 Lymph nodes with metastasis: 0 Extracapsular extension: n/a Breast prognostic profile: Estrogen receptor: Not repeated, previous study demonstrated 100% positivity ([id number]) Progesterone receptor: Not repeated, previous study demonstrated 100% positivity ([id number]) Her 2 neu: Repeated, previous study demonstrated no amplification (1.21), ([id number]) Ki-67: Not repeated, previous study demonstrated 50% proliferation rate ([id number]). Non-neoplastic breast: Fibrocystic change and fibrosis. TNM: pT1c, pN0, pMX (CRR:gt, 07/07/11) 2. Cytokeratin Ae1/3 immunostain does not demonstrate any intranodal metastatic epithelial tumor deposits. There are hyalinized and calcified intra-nodal nodules present without necrosis. GMS stain is negative for yeast and fungi. [place] RUND DO Pathologist, Electronic Signature  RADIOGRAPHIC STUDIES:  No results found.  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 Tumor was ER +100% PR +100% HER-2/neu negative with Ki-67 of 50%. No evidence of recurrent disease.  #2 Oncotype testing revealed a distant recurrence of only 6%   #3 Patient is now status post radiation therapy to the right breast.  #4 patient receiving Arimidex 1 mg daily since August 2013.  #5 patient had a mammogram performed in April looks terrific.  PLAN:   #1 patient will continue Arimidex 1 mg  daily overall she is tolerating it well.  #2 she is counseled to have yearly diagnostic mammograms.  #3 she will be seen back in 6 months time.  All questions were answered. The patient knows to call the clinic with any problems, questions or concerns. We can certainly see the patient much sooner if necessary.  I spent 25 minutes counseling the patient face to face. The total time spent in the appointment was 30 minutes.   Drue Second, MD Medical/Oncology [facility] [phone] (beeper) [phone] (Office)  01/17/2013, 11:36 AM

## 2013-01-17 NOTE — Patient Instructions (Signed)
Continue arimidex daily  We will see you bac in 6 months.  You will a mammogram in April 2015

## 2013-04-17 ENCOUNTER — Ambulatory Visit (INDEPENDENT_AMBULATORY_CARE_PROVIDER_SITE_OTHER): Payer: Medicare Other | Admitting: General Surgery

## 2013-04-17 ENCOUNTER — Encounter (INDEPENDENT_AMBULATORY_CARE_PROVIDER_SITE_OTHER): Payer: Self-pay | Admitting: General Surgery

## 2013-04-17 VITALS — BP 130/80 | HR 60 | Resp 20 | Ht 62.5 in | Wt 192.6 lb

## 2013-04-17 DIAGNOSIS — C50419 Malignant neoplasm of upper-outer quadrant of unspecified female breast: Secondary | ICD-10-CM

## 2013-04-17 NOTE — Progress Notes (Signed)
Chief complaint: Followup breast cancer  History: Patient returns for  long-term followup status post lumpectomy and sentinel lymph node biopsy, radiation, and now on adjuvant Arimidex for T1 C. N0 cancer of the right breast with surgery in May of 2013. She reports no problems and is tolerating her hormonal treatment with minimal if any side effects. She is very happy with the way she feels. She has not noted any recent changes in her breast.  Specifically no skin changes or nipple discharge or current mass that she is worried about. No unusual pain.  Exam: BP 130/80  Pulse 60  Resp 20  Ht 5' 2.5" (1.588 m)  Wt 192 lb 9.6 oz (87.363 kg)  BMI 34.64 kg/m2  LMP 03/13/1990 General: Moderately overweight but well-appearing Caucasian female Lymph nodes: No cervical, supraclavicular, or axillary nodes palpable Breasts: There are mild post radiation changes of the right breast. Lumpectomy site well healed without thickening or mass. No other masses in either breast. Lungs: Clear equal breath sounds  Mammogram due in April  Assessment plan: Doing well without evidence of early recurrence or complication from treatment. Return in 6 months alternating with Dr. Chancy Milroy.

## 2013-05-15 ENCOUNTER — Other Ambulatory Visit: Payer: Self-pay | Admitting: Oncology

## 2013-05-15 DIAGNOSIS — Z1231 Encounter for screening mammogram for malignant neoplasm of breast: Secondary | ICD-10-CM

## 2013-05-15 DIAGNOSIS — Z853 Personal history of malignant neoplasm of breast: Secondary | ICD-10-CM

## 2013-07-02 ENCOUNTER — Other Ambulatory Visit: Payer: Self-pay | Admitting: Adult Health

## 2013-07-02 DIAGNOSIS — Z853 Personal history of malignant neoplasm of breast: Secondary | ICD-10-CM

## 2013-07-03 DIAGNOSIS — I789 Disease of capillaries, unspecified: Secondary | ICD-10-CM | POA: Diagnosis not present

## 2013-07-03 DIAGNOSIS — D2360 Other benign neoplasm of skin of unspecified upper limb, including shoulder: Secondary | ICD-10-CM | POA: Diagnosis not present

## 2013-07-03 DIAGNOSIS — D2370 Other benign neoplasm of skin of unspecified lower limb, including hip: Secondary | ICD-10-CM | POA: Diagnosis not present

## 2013-07-03 DIAGNOSIS — D1801 Hemangioma of skin and subcutaneous tissue: Secondary | ICD-10-CM | POA: Diagnosis not present

## 2013-07-03 DIAGNOSIS — L821 Other seborrheic keratosis: Secondary | ICD-10-CM | POA: Diagnosis not present

## 2013-07-03 DIAGNOSIS — Z85828 Personal history of other malignant neoplasm of skin: Secondary | ICD-10-CM | POA: Diagnosis not present

## 2013-07-03 DIAGNOSIS — L570 Actinic keratosis: Secondary | ICD-10-CM | POA: Diagnosis not present

## 2013-07-08 ENCOUNTER — Other Ambulatory Visit: Payer: Self-pay | Admitting: Adult Health

## 2013-07-08 ENCOUNTER — Ambulatory Visit
Admission: RE | Admit: 2013-07-08 | Discharge: 2013-07-08 | Disposition: A | Discharge status: Home / Self Care | Payer: Medicare Other | Source: Ambulatory Visit | Attending: Oncology | Admitting: Oncology

## 2013-07-08 ENCOUNTER — Ambulatory Visit
Admission: RE | Admit: 2013-07-08 | Discharge: 2013-07-08 | Disposition: A | Discharge status: Home / Self Care | Payer: Medicare Other | Source: Ambulatory Visit | Attending: Adult Health | Admitting: Adult Health

## 2013-07-08 DIAGNOSIS — N63 Unspecified lump in unspecified breast: Secondary | ICD-10-CM | POA: Diagnosis not present

## 2013-07-08 DIAGNOSIS — Z853 Personal history of malignant neoplasm of breast: Secondary | ICD-10-CM

## 2013-07-08 DIAGNOSIS — N6310 Unspecified lump in the right breast, unspecified quadrant: Secondary | ICD-10-CM

## 2013-07-17 ENCOUNTER — Ambulatory Visit
Admission: RE | Admit: 2013-07-17 | Discharge: 2013-07-17 | Disposition: A | Discharge status: Home / Self Care | Payer: Medicare Other | Source: Ambulatory Visit | Attending: Adult Health | Admitting: Adult Health

## 2013-07-17 ENCOUNTER — Telehealth: Payer: Self-pay

## 2013-07-17 DIAGNOSIS — N6310 Unspecified lump in the right breast, unspecified quadrant: Secondary | ICD-10-CM

## 2013-07-17 DIAGNOSIS — R920 Mammographic microcalcification found on diagnostic imaging of breast: Secondary | ICD-10-CM | POA: Diagnosis not present

## 2013-07-17 DIAGNOSIS — R928 Other abnormal and inconclusive findings on diagnostic imaging of breast: Secondary | ICD-10-CM | POA: Diagnosis not present

## 2013-07-17 DIAGNOSIS — C50419 Malignant neoplasm of upper-outer quadrant of unspecified female breast: Secondary | ICD-10-CM

## 2013-07-17 DIAGNOSIS — N641 Fat necrosis of breast: Secondary | ICD-10-CM | POA: Diagnosis not present

## 2013-07-17 NOTE — Telephone Encounter (Signed)
Called pt to let her know of schedule changes.  Per pt, last mammogram has new spot.  She had biopsy today.  She would prefer to see MD at this point.  Per GM, schedule pt 5/22 at 10 am.  Per pt, d/t will work fine for her 8 lab, 10 MD.  Lab appt scheduled, lab orders entered.  MD appt to be scheduled by AM.

## 2013-07-31 ENCOUNTER — Other Ambulatory Visit: Payer: Medicare Other

## 2013-07-31 ENCOUNTER — Ambulatory Visit: Payer: Medicare Other | Admitting: Oncology

## 2013-08-01 ENCOUNTER — Other Ambulatory Visit (HOSPITAL_BASED_OUTPATIENT_CLINIC_OR_DEPARTMENT_OTHER): Payer: Medicare Other

## 2013-08-01 ENCOUNTER — Telehealth: Payer: Self-pay | Admitting: Oncology

## 2013-08-01 ENCOUNTER — Ambulatory Visit (HOSPITAL_BASED_OUTPATIENT_CLINIC_OR_DEPARTMENT_OTHER): Payer: Medicare Other | Admitting: Oncology

## 2013-08-01 ENCOUNTER — Other Ambulatory Visit: Payer: Medicare Other

## 2013-08-01 VITALS — BP 110/73 | HR 65 | Temp 98.9°F | Resp 18 | Ht 62.5 in | Wt 189.3 lb

## 2013-08-01 DIAGNOSIS — C50419 Malignant neoplasm of upper-outer quadrant of unspecified female breast: Secondary | ICD-10-CM | POA: Diagnosis not present

## 2013-08-01 DIAGNOSIS — M949 Disorder of cartilage, unspecified: Secondary | ICD-10-CM

## 2013-08-01 DIAGNOSIS — M899 Disorder of bone, unspecified: Secondary | ICD-10-CM

## 2013-08-01 LAB — CBC WITH DIFFERENTIAL/PLATELET
BASO%: 1.4 % (ref 0.0–2.0)
BASOS ABS: 0.1 10*3/uL (ref 0.0–0.1)
EOS%: 2.5 % (ref 0.0–7.0)
Eosinophils Absolute: 0.1 10*3/uL (ref 0.0–0.5)
HCT: 45.9 % (ref 34.8–46.6)
HEMOGLOBIN: 15.2 g/dL (ref 11.6–15.9)
LYMPH%: 29.2 % (ref 14.0–49.7)
MCH: 30.5 pg (ref 25.1–34.0)
MCHC: 33 g/dL (ref 31.5–36.0)
MCV: 92.3 fL (ref 79.5–101.0)
MONO#: 0.6 10*3/uL (ref 0.1–0.9)
MONO%: 10.6 % (ref 0.0–14.0)
NEUT#: 3.1 10*3/uL (ref 1.5–6.5)
NEUT%: 56.3 % (ref 38.4–76.8)
PLATELETS: 172 10*3/uL (ref 145–400)
RBC: 4.97 10*6/uL (ref 3.70–5.45)
RDW: 12.7 % (ref 11.2–14.5)
WBC: 5.5 10*3/uL (ref 3.9–10.3)
lymph#: 1.6 10*3/uL (ref 0.9–3.3)

## 2013-08-01 LAB — COMPREHENSIVE METABOLIC PANEL (CC13)
ALK PHOS: 116 U/L (ref 40–150)
ALT: 59 U/L — ABNORMAL HIGH (ref 0–55)
AST: 45 U/L — ABNORMAL HIGH (ref 5–34)
Albumin: 3.9 g/dL (ref 3.5–5.0)
Anion Gap: 12 mEq/L — ABNORMAL HIGH (ref 3–11)
BUN: 16 mg/dL (ref 7.0–26.0)
CALCIUM: 9.6 mg/dL (ref 8.4–10.4)
CHLORIDE: 108 meq/L (ref 98–109)
CO2: 22 mEq/L (ref 22–29)
Creatinine: 0.8 mg/dL (ref 0.6–1.1)
GLUCOSE: 104 mg/dL (ref 70–140)
Potassium: 4.1 mEq/L (ref 3.5–5.1)
Sodium: 142 mEq/L (ref 136–145)
Total Bilirubin: 0.36 mg/dL (ref 0.20–1.20)
Total Protein: 6.7 g/dL (ref 6.4–8.3)

## 2013-08-01 NOTE — Telephone Encounter (Signed)
, °

## 2013-08-01 NOTE — Progress Notes (Signed)
[place]  Telephone:([phone] Fax:(336) [phone]     ID: Joy Gonzales OB: 1940-06-11  MR#: [id number]  ULA#:[id number]  PCP: Donnajean Lopes, MD GYN:  Quillian Quince.taken SU: Excell Seltzer OTHER MD: Peyton Bottoms, Peter Martinique  CHIEF COMPLAINT: Early-stage breast cancer/ under active treatment  BREAST CANCER HISTORY: From Dr. Pearlie Oyster original intake note 06/28/2011:  "Joy Gonzales is a 73 y.o. female who recently self palpated a lump in the 12:00 position of her right breast which prompted a mammogram. She noticed this around beginning of February. Mammography on 06/09/2011 demonstrated a mass corresponding to the area of self palpation. On that same day, an ultrasound demonstrated a 1.4 x 0.9 x 1.0 cm mass in the 12:00 position. The patient underwent core needle biopsy and 06/20/2011. The pathology is notable for that described above. There was lymphovascular space invasion identified in the biopsy specimen. She underwent MRI of her breasts bilaterally and 06/23/2011. This demonstrated a 1.6 x 1.5 x 1.6 cm lesion in the 12:00 position of the right breast. There is no abnormal enhancement seen in the opposite breast. There is no enlarged axillary or internal mammary adenopathy. This is a solitary finding.   The patient has a history of bladder cancer which has been in remission since resection in 1999. 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 The patient also had reports a history of vaginal spotting this year which was explored by [facility], workup negative for cancer."  The patient's subsequent history is as detailed below  INTERVAL HISTORY: Joy Gonzales returns today for followup of her early stage breast cancer, accompanied by her husband Joy Gonzales. She is establishing herself in my practice. Since her last visit here a change was noted in the upper outer quadrant of her right breast by routine  mammography and ultrasonography 06/30/2013. This was biopsied 07/17/2013 and found to be fat necrosis, with no evidence of malignancy (SAA [id number])  REVIEW OF SYSTEMS: She is tolerating the anastrozole well, with minimal hot flashes, mild vaginal dryness, which has not really changed from baseline, she says, and no arthralgias or myalgias. She has some hoarseness, and of course a history of multiple vocal cord issues and surgeries in the past. She does not exercise regularly. Aside from these issues, a detailed review of systems today was noncontributory  PAST MEDICAL HISTORY: Past Medical History  Diagnosis Date  . HTN (hypertension)   . GERD (gastroesophageal reflux disease)   . Hyperlipemia   . Elevated LFTs   . Tinnitus   . Diverticulosis   . Vocal cord granuloma   . IBS (irritable bowel syndrome)   . Rectocele   . Lichen sclerosus   . Atrophic vaginitis   . HSV-1 infection   . Osteopenia   . Flushing   . Dyspnea     on exertion  . PONV (postoperative nausea and vomiting)   . Bladder cancer   . Breast cancer   . S/P radiation therapy 08/24/11 - 10/05/11    Right Breast: 50 Gy/25 Fractions with boost of 10Gy/5 Fractions  . Use of anastrozole (Arimidex) 10/25/11    PAST SURGICAL HISTORY: Past Surgical History  Procedure Laterality Date  . Tonsillectomy and adenoidectomy    . Throat surgery      LARYN. GRANULOMA  POLYP  . Cholecystectomy    . Bladder tumor excision    . Breast tumor removed    . Hysteroscopy w/d&c  05/18/2011    Procedure: DILATATION AND CURETTAGE /HYSTEROSCOPY;  Surgeon: Bennetta Laos, MD;  Location: [facility];  Service: Gynecology;  Laterality: N/A;  or Tuesday, March 12 7:30am? or Thursday, March 14 7:30am? or Tuesday March 19 7:30am?  . Dilation and curettage of uterus    . Hysteroscopy    . Needle core biospy  06/20/2011    Right Breast, 12'Oclock - Invasive Mammary Carcinoma with Lymphovascular Invasion: ER/PR 100%, Ki67 50%  . 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 Cancer Father     ESOPHAGEAL  . Coronary artery disease Father   . Hypertension Father   . Heart failure Father   . Barrett's esophagus Brother   . Hypertension Brother   . Kidney cancer Sister   . Hypertension Sister   . Diabetes Sister   . Cancer Sister     kidney  . Hypertension Sister   . Stroke Sister   . Breast cancer Maternal Grandmother     Age 67's  . Breast cancer Maternal Aunt     Age 8's   the patient's mothers mother was diagnosed with breast cancer the age of 73, and a second aunts on the same side was diagnosed with breast cancer the age of 14. The patient's niece, Joy Gonzales, is valid here for triple negative breast cancer. She has been found to be BRCA negative. Joy Gonzales herself has not been tested as of yet  GYNECOLOGIC HISTORY:  Menarche age 34, first live birth age 67, the patient is Joy Gonzales. She went through menopause in her late 44s and took hormone replacement for about 10 years, and then Estring for about 5 years.   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 Her husband Joy Gonzales used to be in Press photographer for [employer]. He is retired as well. The patient's son is an intense or and runs his own company he are intact. Incidentally they tell me his wife is from [place] so those grandchildren are bilingual. The second son, Joy Gonzales, is disabled secondary to Crohn's disease. The patient has 4 grandchildren. She is a Tourist information centre manager    ADVANCED DIRECTIVES:    HEALTH MAINTENANCE: History  Substance Use Topics  . Smoking status: Never Smoker   . Smokeless tobacco: Never Used  . Alcohol Use: 0.0 oz/week     Comment: very rare     Colonoscopy:  PAP:  Bone density:  Lipid panel:  Allergies  Allergen Reactions  . Adhesive [Tape] Rash  .  Felodipine Er Palpitations    tachycardia    Current Outpatient Prescriptions  Medication Sig Dispense Refill  . ALPRAZolam (XANAX) 0.25 MG tablet Take 0.125 mg by mouth daily as needed. Take infrequently      . anastrozole (ARIMIDEX) 1 MG tablet Take 1 tablet (1 mg total) by mouth daily.  90 tablet  12  . aspirin 81 MG tablet Take 81 mg by mouth daily.        . calcium carbonate (TITRALAC) 420 MG CHEW Chew 420 mg by mouth.      . cholecalciferol (VITAMIN D) 400 UNITS TABS Take 1,000 Units by mouth.       . clobetasol cream (TEMOVATE) 0.05 % Apply topically 2 (two) times daily.  45 g  1  . esomeprazole (NEXIUM) 40 MG capsule Take 40 mg by mouth daily.        . fenofibrate micronized (LOFIBRA) 200 MG capsule Take 200 mg by mouth daily.        . fexofenadine (ALLEGRA) 180 MG tablet Take 180 mg by mouth as needed.        . metoprolol succinate (TOPROL XL) 25 MG 24 hr tablet Take 25 mg by mouth daily.        . Multiple Vitamin (MULTI-VITAMIN DAILY PO) Take 1 tablet by mouth daily.      Marland Kitchen olmesartan-hydrochlorothiazide (BENICAR HCT) 20-12.5 MG per tablet Take 1 tablet by mouth daily.      . ranitidine (ZANTAC) 75 MG tablet Take 75 mg by mouth at bedtime.       . valACYclovir (VALTREX) 500 MG tablet Take 1 tablet (500 mg total) by mouth 2 (two) times daily. When she has a fever blister only  30 tablet  2   No current facility-administered medications for this visit.    OBJECTIVE: Middle-aged white woman who appears stated age 48 Vitals:   08/01/13 1007  BP: 110/73  Pulse: 65  Temp: 98.9 F (37.2 C)  Resp: 18     Body mass index is 34.05 kg/(m^2).    ECOG FS:0 - Asymptomatic  Ocular: Sclerae unicteric, pupils equal, round and reactive to light Ear-nose-throat: Oropharynx clear, dentition in good repair Lymphatic: No cervical or supraclavicular adenopathy Lungs no rales or rhonchi, good excursion bilaterally Heart regular rate and rhythm, no murmur appreciated Abd soft, nontender,  positive bowel sounds MSK no focal spinal tenderness, no joint edema Neuro: non-focal, well-oriented, appropriate affect Breasts: The right breast is status post lumpectomy and radiation. There is no evidence of local recurrence. The right axilla is benign. The left breast is unremarkable   LAB RESULTS:  CMP     Component Value Date/Time   NA 142 08/01/2013 0936   NA 143 08/21/2011 1544   K 4.1 08/01/2013 0936   K 3.5 08/21/2011 1544   CL 108* 07/25/2012 1001   CL 105 08/21/2011 1544   CO2 22 08/01/2013 0936   CO2 28 08/21/2011 1544   GLUCOSE 104 08/01/2013 0936   GLUCOSE 98 07/25/2012 1001   GLUCOSE 93 08/21/2011 1544   BUN 16.0 08/01/2013 0936   BUN 15 08/21/2011 1544   CREATININE 0.8 08/01/2013 0936   CREATININE 0.78 08/21/2011 1544   CALCIUM 9.6 08/01/2013 0936   CALCIUM 9.4 08/21/2011 1544   PROT 6.7 08/01/2013 0936   PROT 6.7 08/21/2011 1544   ALBUMIN 3.9 08/01/2013 0936   ALBUMIN 3.9 08/21/2011 1544   AST 45* 08/01/2013 0936   AST 32 08/21/2011 1544   ALT 59* 08/01/2013 0936   ALT 45* 08/21/2011 1544   ALKPHOS 116 08/01/2013 0936   ALKPHOS 85 08/21/2011 1544   BILITOT 0.36 08/01/2013 0936   BILITOT 0.2* 08/21/2011 1544   GFRNONAA 82* 07/03/2011 1330   GFRAA >90 07/03/2011 1330    I No results found for this basename: SPEP, UPEP,  kappa and lambda light chains    Lab Results  Component Value Date   WBC 5.5 08/01/2013   NEUTROABS 3.1 08/01/2013   HGB 15.2 08/01/2013   HCT 45.9 08/01/2013   MCV 92.3 08/01/2013   PLT 172 08/01/2013      Chemistry      Component Value Date/Time   NA 142 08/01/2013 0936   NA 143 08/21/2011 1544   K 4.1  08/01/2013 0936   K 3.5 08/21/2011 1544   CL 108* 07/25/2012 1001   CL 105 08/21/2011 1544   CO2 22 08/01/2013 0936   CO2 28 08/21/2011 1544   BUN 16.0 08/01/2013 0936   BUN 15 08/21/2011 1544   CREATININE 0.8 08/01/2013 0936   CREATININE 0.78 08/21/2011 1544      Component Value Date/Time   CALCIUM 9.6 08/01/2013 0936   CALCIUM 9.4 08/21/2011 1544   ALKPHOS  116 08/01/2013 0936   ALKPHOS 85 08/21/2011 1544   AST 45* 08/01/2013 0936   AST 32 08/21/2011 1544   ALT 59* 08/01/2013 0936   ALT 45* 08/21/2011 1544   BILITOT 0.36 08/01/2013 0936   BILITOT 0.2* 08/21/2011 1544       Lab Results  Component Value Date   LABCA2 12 06/28/2011    No components found with this basename: [id number]    No results found for this basename: INR,  in the last 168 hours  Urinalysis No results found for this basename: colorurine, appearanceur, labspec, phurine, glucoseu, hgbur, bilirubinur, ketonesur, proteinur, urobilinogen, nitrite, leukocytesur    STUDIES: Mm Digital Diagnostic Bilat  07/08/2013   CLINICAL DATA:  Right lumpectomy in 2013.  Asymptomatic.  EXAM: DIGITAL DIAGNOSTIC  BILATERAL MAMMOGRAM WITH CAD  ULTRASOUND RIGHT BREAST  COMPARISON:  Multiple priors  ACR Breast Density Category b: There are scattered areas of fibroglandular density.  FINDINGS: The patient has had a right lumpectomy. Spot compression views in the upper-outer quadrant of the right breast, posteriorly confirm the presence of an irregular isodense mass with associated punctate calcifications measuring 0.4 x 0.2 cm. No other suspicious mass or calcifications are seen in the right breast. No mass or suspicious calcifications are seen in the left breast. Compared with priors.  Mammographic images were processed with CAD.  On physical exam, I palpate normal tissue in the upper-outer quadrant of the right breast.  Ultrasound is performed, showing a vague irregular hypoechoic mass with ill-defined margins in the 10 o'clock position, 11 cm from nipple measuring 0.3 x 0.2 x 0.4 cm. This likely corresponds to the area seen mammographically. Normal axillary contents are imaged on the right.  IMPRESSION: Mass, right breast for which a biopsy is recommended. An attempt will be made to localize the area stereotactically to ensure the correct area is sampled. No mammographic evidence of malignancy, left breast.   RECOMMENDATION: Stereotactic right breast biopsy which is scheduled for May 7th at 9 a.m.  I have discussed the findings and recommendations with the patient. Results were also provided in writing at the conclusion of the visit. If applicable, a reminder letter will be sent to the patient regarding the next appointment.  BI-RADS CATEGORY  4: Suspicious.   Electronically Signed   By: Duke Salvia M.D.   On: 07/08/2013 11:59   US Breast Ltd Uni Right Inc Axilla  07/08/2013   CLINICAL DATA:  Right lumpectomy in 2013.  Asymptomatic.  EXAM: DIGITAL DIAGNOSTIC  BILATERAL MAMMOGRAM WITH CAD  ULTRASOUND RIGHT BREAST  COMPARISON:  Multiple priors  ACR Breast Density Category b: There are scattered areas of fibroglandular density.  FINDINGS: The patient has had a right lumpectomy. Spot compression views in the upper-outer quadrant of the right breast, posteriorly confirm the presence of an irregular isodense mass with associated punctate calcifications measuring 0.4 x 0.2 cm. No other suspicious mass or calcifications are seen in the right breast. No mass or suspicious calcifications are seen in the left breast. Compared with priors.  Mammographic images were processed with CAD.  On physical exam, I palpate normal tissue in the upper-outer quadrant of the right breast.  Ultrasound is performed, showing a vague irregular hypoechoic mass with ill-defined margins in the 10 o'clock position, 11 cm from nipple measuring 0.3 x 0.2 x 0.4 cm. This likely corresponds to the area seen mammographically. Normal axillary contents are imaged on the right.  IMPRESSION: Mass, right breast for which a biopsy is recommended. An attempt will be made to localize the area stereotactically to ensure the correct area is sampled. No mammographic evidence of malignancy, left breast.  RECOMMENDATION: Stereotactic right breast biopsy which is scheduled for May 7th at 9 a.m.  I have discussed the findings and recommendations with the patient.  Results were also provided in writing at the conclusion of the visit. If applicable, a reminder letter will be sent to the patient regarding the next appointment.  BI-RADS CATEGORY  4: Suspicious.   Electronically Signed   By: Duke Salvia M.D.   On: 07/08/2013 11:59   Mm Rt Breast Bx W Loc Dev 1st Lesion Image Bx Spec Stereo Guide  07/18/2013   ADDENDUM REPORT: 07/18/2013 15:26  ADDENDUM: Pathology for the stereotactic guided core needle biopsy of the right breast is reported as fat necrosis with calcifications. There is no evidence of malignancy.  The benign histology is concordant with pre biopsy imaging. Recommend resumption of annual diagnostic mammography. These results and recommendations were discussed with the patient by telephone on 07/18/2013.   Electronically Signed   By: Andres Shad   On: 07/18/2013 15:26   07/18/2013   CLINICAL DATA:  73 year old female with a suspicious right breast asymmetry/calcifications  EXAM: RIGHT BREAST STEREOTACTIC CORE NEEDLE BIOPSY  COMPARISON:  Previous exams.  FINDINGS: The patient and I discussed the procedure of stereotactic-guided biopsy including benefits and alternatives. We discussed the high likelihood of a successful procedure. We discussed the risks of the procedure including infection, bleeding, tissue injury, clip migration, and inadequate sampling. Informed written consent was given. The usual time out protocol was performed immediately prior to the procedure.  Using sterile technique and 2% Lidocaine as local anesthetic, under stereotactic guidance, a 9 gauge vacuum device was used to perform core needle biopsy of the asymmetry with calcifications in the upper-outer right breast using a lateral approach. Specimen radiograph was performed showing the presence of 1 calcification. Specimens with calcifications are identified for pathology.  At the conclusion of the procedure, a coil shaped tissue marker clip was deployed into the biopsy cavity. Follow-up  2-view mammogram confirmed clip in the targeted region of the asymmetry/mass.  IMPRESSION: Stereotactic-guided biopsy of a suspicious asymmetry/ mass with calcifications in the upper-outer right breast. No apparent complications.  Electronically Signed: By: Everlean Alstrom M.D. On: 07/17/2013 10:28    ASSESSMENT: 73 y.o. 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  (2) Oncotype DX score of 9 predicts a risk of outside the breast recurrence within 10 years of 6% if the patient's only systemic treatment is tamoxifen for 5 years. It also protects no benefit from chemotherapy   (3) the patient completed adjuvant radiation 10/05/2011  (4) started anastrozole August 2013  (5) DEXA scan at [facility] gynecology 11/21/2011 showed osteopenia  PLAN: Respect approximately 45 minutes going over her situation,  which is very favorable. She understands her overall prognosis is better than predicted by her Oncotype, since 5 years of anastrozole is more effective than 5 years of tamoxifen. We also discussed the meaning of fat necrosis, which was recently diagnosed in her right breast as noted above. This was alarming to Arella, but what is reassuring is had any change in her breasts should be picked up her early several close surveillance.  Joy Gonzales is tolerating the anastrozole well, and the plan will be to continue that for total of 5 years. She will have a repeat bone density sometime this year and if there has been significant bone density loss we can consider adding alendronate or zolendronate to her treatment.  Though Joy Gonzales's chance of carrying a BRCA mutation is low, she qualifies for testing, with 3 relatives with breast cancer on the same side of the family. We discussed this today and we  are arranging for genetic counseling for her.  She has a good understanding of the overall plan. She agrees with it. She knows a goal of treatment in her cases cure. She will call with any problems that may develop before her next visit here.  Chauncey Cruel, MD   08/02/2013 9:47 AM

## 2013-09-23 DIAGNOSIS — D3000 Benign neoplasm of unspecified kidney: Secondary | ICD-10-CM | POA: Diagnosis not present

## 2013-09-23 DIAGNOSIS — N393 Stress incontinence (female) (male): Secondary | ICD-10-CM | POA: Diagnosis not present

## 2013-09-23 DIAGNOSIS — Z8551 Personal history of malignant neoplasm of bladder: Secondary | ICD-10-CM | POA: Diagnosis not present

## 2013-10-30 ENCOUNTER — Ambulatory Visit (INDEPENDENT_AMBULATORY_CARE_PROVIDER_SITE_OTHER): Payer: Medicare Other | Admitting: General Surgery

## 2013-10-30 VITALS — BP 126/70 | HR 72 | Temp 97.0°F | Ht 62.0 in | Wt 188.0 lb

## 2013-10-30 DIAGNOSIS — C50419 Malignant neoplasm of upper-outer quadrant of unspecified female breast: Secondary | ICD-10-CM | POA: Diagnosis not present

## 2013-10-30 DIAGNOSIS — C50411 Malignant neoplasm of upper-outer quadrant of right female breast: Secondary | ICD-10-CM

## 2013-10-30 NOTE — Progress Notes (Signed)
Chief complaint: Followup breast cancer  History: Patient returns for  long-term followup status post lumpectomy and sentinel lymph node biopsy, radiation, and now on adjuvant Arimidex for T1 C. N0 cancer of the right breast with surgery in May of 2013. She reports no problems and is tolerating her hormonal treatment with minimal if any side effects.  She had a change noted on her mammogram in May with increased density and calcifications in the upper outer right breast. Large core biopsy was performed which fortunately showed only fat necrosis with calcifications.   She is very happy with the way she feels. She has not noted any recent changes in her breast.  Specifically no skin changes or nipple discharge or current mass that she is worried about. No unusual pain.  Exam: BP 126/70  Pulse 72  Temp(Src) 97 F (36.1 C)  Ht 5\' 2"  (1.575 m)  Wt 188 lb (85.276 kg)  BMI 34.38 kg/m2  LMP 03/13/1990 General: Moderately overweight but well-appearing Caucasian female Lymph nodes: No cervical, supraclavicular, or axillary nodes palpable Breasts: There are mild post radiation changes of the right breast. Lumpectomy site well healed without thickening or mass. No other masses in either breast. Lungs: Clear equal breath sounds   Assessment plan: Doing well without evidence of  recurrence or complication from treatment. Return in 6 months.

## 2013-11-06 ENCOUNTER — Other Ambulatory Visit: Payer: Self-pay | Admitting: Oncology

## 2013-11-06 DIAGNOSIS — C50411 Malignant neoplasm of upper-outer quadrant of right female breast: Secondary | ICD-10-CM

## 2013-11-20 ENCOUNTER — Encounter: Payer: Self-pay | Admitting: Women's Health

## 2013-11-20 ENCOUNTER — Ambulatory Visit (INDEPENDENT_AMBULATORY_CARE_PROVIDER_SITE_OTHER): Payer: Medicare Other | Admitting: Women's Health

## 2013-11-20 VITALS — BP 118/75 | Ht 62.0 in | Wt 189.2 lb

## 2013-11-20 DIAGNOSIS — M949 Disorder of cartilage, unspecified: Secondary | ICD-10-CM | POA: Diagnosis not present

## 2013-11-20 DIAGNOSIS — B009 Herpesviral infection, unspecified: Secondary | ICD-10-CM

## 2013-11-20 DIAGNOSIS — M899 Disorder of bone, unspecified: Secondary | ICD-10-CM

## 2013-11-20 DIAGNOSIS — L900 Lichen sclerosus et atrophicus: Secondary | ICD-10-CM

## 2013-11-20 DIAGNOSIS — L940 Localized scleroderma [morphea]: Secondary | ICD-10-CM | POA: Diagnosis not present

## 2013-11-20 DIAGNOSIS — M8580 Other specified disorders of bone density and structure, unspecified site: Secondary | ICD-10-CM

## 2013-11-20 MED ORDER — VALACYCLOVIR HCL 500 MG PO TABS: 500.0000 mg | ORAL_TABLET | Freq: Two times a day (BID) | ORAL | Status: DC

## 2013-11-20 MED ORDER — CLOBETASOL PROPIONATE 0.05 % EX CREA: TOPICAL_CREAM | Freq: Two times a day (BID) | CUTANEOUS | Status: DC

## 2013-11-20 NOTE — Patient Instructions (Signed)
Health Recommendations for Postmenopausal Women Respected and ongoing research has looked at the most common causes of death, disability, and poor quality of life in postmenopausal women. The causes include heart disease, diseases of blood vessels, diabetes, depression, cancer, and bone loss (osteoporosis). Many things can be done to help lower the chances of developing these and other common problems. CARDIOVASCULAR DISEASE Heart Disease: A heart attack is a medical emergency. Know the signs and symptoms of a heart attack. Below are things women can do to reduce their risk for heart disease.   Do not smoke. If you smoke, quit.  Aim for a healthy weight. Being overweight causes many preventable deaths. Eat a healthy and balanced diet and drink an adequate amount of liquids.  Get moving. Make a commitment to be more physically active. Aim for 30 minutes of activity on most, if not all days of the week.  Eat for heart health. Choose a diet that is low in saturated fat and cholesterol and eliminate trans fat. Include whole grains, vegetables, and fruits. Read and understand the labels on food containers before buying.  Know your numbers. Ask your caregiver to check your blood pressure, cholesterol (total, HDL, LDL, triglycerides) and blood glucose. Work with your caregiver on improving your entire clinical picture.  High blood pressure. Limit or stop your table salt intake (try salt substitute and food seasonings). Avoid salty foods and drinks. Read labels on food containers before buying. Eating well and exercising can help control high blood pressure. STROKE  Stroke is a medical emergency. Stroke may be the result of a blood clot in a blood vessel in the brain or by a brain hemorrhage (bleeding). Know the signs and symptoms of a stroke. To lower the risk of developing a stroke:  Avoid fatty foods.  Quit smoking.  Control your diabetes, blood pressure, and irregular heart rate. THROMBOPHLEBITIS  (BLOOD CLOT) OF THE LEG  Becoming overweight and leading a stationary lifestyle may also contribute to developing blood clots. Controlling your diet and exercising will help lower the risk of developing blood clots. CANCER SCREENING  Breast Cancer: Take steps to reduce your risk of breast cancer.  You should practice "breast self-awareness." This means understanding the normal appearance and feel of your breasts and should include breast self-examination. Any changes detected, no matter how small, should be reported to your caregiver.  After age 40, you should have a clinical breast exam (CBE) every year.  Starting at age 40, you should consider having a mammogram (breast X-ray) every year.  If you have a family history of breast cancer, talk to your caregiver about genetic screening.  If you are at high risk for breast cancer, talk to your caregiver about having an MRI and a mammogram every year.  Intestinal or Stomach Cancer: Tests to consider are a rectal exam, fecal occult blood, sigmoidoscopy, and colonoscopy. Women who are high risk may need to be screened at an earlier age and more often.  Cervical Cancer:  Beginning at age 30, you should have a Pap test every 3 years as long as the past 3 Pap tests have been normal.  If you have had past treatment for cervical cancer or a condition that could lead to cancer, you need Pap tests and screening for cancer for at least 20 years after your treatment.  If you had a hysterectomy for a problem that was not cancer or a condition that could lead to cancer, then you no longer need Pap tests.    If you are between ages 65 and 70, and you have had normal Pap tests going back 10 years, you no longer need Pap tests.  If Pap tests have been discontinued, risk factors (such as a new sexual partner) need to be reassessed to determine if screening should be resumed.  Some medical problems can increase the chance of getting cervical cancer. In these  cases, your caregiver may recommend more frequent screening and Pap tests.  Uterine Cancer: If you have vaginal bleeding after reaching menopause, you should notify your caregiver.  Ovarian Cancer: Other than yearly pelvic exams, there are no reliable tests available to screen for ovarian cancer at this time except for yearly pelvic exams.  Lung Cancer: Yearly chest X-rays can detect lung cancer and should be done on high risk women, such as cigarette smokers and women with chronic lung disease (emphysema).  Skin Cancer: A complete body skin exam should be done at your yearly examination. Avoid overexposure to the sun and ultraviolet light lamps. Use a strong sun block cream when in the sun. All of these things are important for lowering the risk of skin cancer. MENOPAUSE Menopause Symptoms: Hormone therapy products are effective for treating symptoms associated with menopause:  Moderate to severe hot flashes.  Night sweats.  Mood swings.  Headaches.  Tiredness.  Loss of sex drive.  Insomnia.  Other symptoms. Hormone replacement carries certain risks, especially in older women. Women who use or are thinking about using estrogen or estrogen with progestin treatments should discuss that with their caregiver. Your caregiver will help you understand the benefits and risks. The ideal dose of hormone replacement therapy is not known. The Food and Drug Administration (FDA) has concluded that hormone therapy should be used only at the lowest doses and for the shortest amount of time to reach treatment goals.  OSTEOPOROSIS Protecting Against Bone Loss and Preventing Fracture If you use hormone therapy for prevention of bone loss (osteoporosis), the risks for bone loss must outweigh the risk of the therapy. Ask your caregiver about other medications known to be safe and effective for preventing bone loss and fractures. To guard against bone loss or fractures, the following is recommended:  If  you are younger than age 50, take 1000 mg of calcium and at least 600 mg of Vitamin D per day.  If you are older than age 50 but younger than age 70, take 1200 mg of calcium and at least 600 mg of Vitamin D per day.  If you are older than age 70, take 1200 mg of calcium and at least 800 mg of Vitamin D per day. Smoking and excessive alcohol intake increases the risk of osteoporosis. Eat foods rich in calcium and vitamin D and do weight bearing exercises several times a week as your caregiver suggests. DIABETES Diabetes Mellitus: If you have type I or type 2 diabetes, you should keep your blood sugar under control with diet, exercise, and recommended medication. Avoid starchy and fatty foods, and too many sweets. Being overweight can make diabetes control more difficult. COGNITION AND MEMORY Cognition and Memory: Menopausal hormone therapy is not recommended for the prevention of cognitive disorders such as Alzheimer's disease or memory loss.  DEPRESSION  Depression may occur at any age, but it is common in elderly women. This may be because of physical, medical, social (loneliness), or financial problems and needs. If you are experiencing depression because of medical problems and control of symptoms, talk to your caregiver about this. Physical   activity and exercise may help with mood and sleep. Community and volunteer involvement may improve your sense of value and worth. If you have depression and you feel that the problem is getting worse or becoming severe, talk to your caregiver about which treatment options are best for you. ACCIDENTS  Accidents are common and can be serious in elderly woman. Prepare your house to prevent accidents. Eliminate throw rugs, place hand bars in bath, shower, and toilet areas. Avoid wearing high heeled shoes or walking on wet, snowy, and icy areas. Limit or stop driving if you have vision or hearing problems, or if you feel you are unsteady with your movements and  reflexes. HEPATITIS C Hepatitis C is a type of viral infection affecting the liver. It is spread mainly through contact with blood from an infected person. It can be treated, but if left untreated, it can lead to severe liver damage over the years. Many people who are infected do not know that the virus is in their blood. If you are a "baby-boomer", it is recommended that you have one screening test for Hepatitis C. IMMUNIZATIONS  Several immunizations are important to consider having during your senior years, including:   Tetanus, diphtheria, and pertussis booster shot.  Influenza every year before the flu season begins.  Pneumonia vaccine.  Shingles vaccine.  Others, as indicated based on your specific needs. Talk to your caregiver about these. Document Released: 04/21/2005 Document Revised: 07/14/2013 Document Reviewed: 12/16/2007 ExitCare Patient Information 2015 [employer]. This information is not intended to replace advice given to you by your health care provider. Make sure you discuss any questions you have with your health care provider.  

## 2013-11-20 NOTE — Progress Notes (Signed)
Joy Gonzales [birth year] [id number]    History:    Presents for breast and pelvic exam. No vaginal bleeding. Right breast cancer 2013 lumpectomy with radiation. Benign right breast biopsy 07/2013. 2013 T score -2.1, -1.8 hip average FRAX 10%/1.9%. Normal Pap history. Lichen sclerosus uses Temovate rarely. Hypertension/hyperlipidemia primary care manages  Past medical history, past surgical history, family history and social history were all reviewed and documented in the EPIC chart. Bladder cancer 2000.   ROS:  A  12 point ROS was performed and pertinent positives and negatives are included.  Exam:  Filed Vitals:   11/20/13 1112  BP: 118/75    General appearance:  Normal Thyroid:  Symmetrical, normal in size, without palpable masses or nodularity. Respiratory  Auscultation:  Clear without wheezing or rhonchi Cardiovascular  Auscultation:  Regular rate, without rubs, murmurs or gallops  Edema/varicosities:  Not grossly evident Abdominal  Soft,nontender, without masses, guarding or rebound.  Liver/spleen:  No organomegaly noted  Hernia:  None appreciated  Skin  Inspection:  Grossly normal   Breasts: Examined lying and sitting.     Right: Well healed scar,     Left: Without masses, retractions, discharge or axillary adenopathy. Gentitourinary   Inguinal/mons:  Normal without inguinal adenopathy  External genitalia:  Atrophic, 2 cm excoriated area on the perineum  BUS/Urethra/Skene's glands:  Normal  Vagina:  Atrophic  Cervix:  Normal  Uterus:   normal in size, shape and contour.  Midline and mobile  Adnexa/parametria:     Rt: Without masses or tenderness.   Lt: Without masses or tenderness.  Anus and perineum: Normal  Digital rectal exam: Normal sphincter tone without palpated masses or tenderness  Assessment/Plan:  73 y.o. MWF G2P2 for rest and pelvic exam.  2013 right breast cancer lumpectomy and radiation on Arimidex. Dr. Jana Hakim Osteopenia without elevated  FRAX Hypertension/hyperlipidemia-primary care manages labs and meds Lichen sclerosus  Plan: SBE's, continue annual mammogram, calcium rich diet, instructed to have a vitamin D level checked at next blood draw. Repeat DEXA will schedule. Home safety and fall prevention discussed. Temovate ointment to use sparingly, uses one prescription annually. Encouraged A& D. ointment to external genitalia at area of excoriation. Instructed to call if no relief. Current on immunizations.   Note: This dictation was prepared with Dragon/digital dictation.  Any transcriptional errors that result are unintentional. Huel Cote [facility], 12:21 PM 11/20/2013

## 2013-11-21 DIAGNOSIS — Z23 Encounter for immunization: Secondary | ICD-10-CM | POA: Diagnosis not present

## 2013-12-12 DIAGNOSIS — R8290 Unspecified abnormal findings in urine: Secondary | ICD-10-CM | POA: Diagnosis not present

## 2013-12-12 DIAGNOSIS — I10 Essential (primary) hypertension: Secondary | ICD-10-CM | POA: Diagnosis not present

## 2013-12-12 DIAGNOSIS — R8299 Other abnormal findings in urine: Secondary | ICD-10-CM | POA: Diagnosis not present

## 2013-12-12 DIAGNOSIS — E785 Hyperlipidemia, unspecified: Secondary | ICD-10-CM | POA: Diagnosis not present

## 2013-12-19 DIAGNOSIS — E785 Hyperlipidemia, unspecified: Secondary | ICD-10-CM | POA: Diagnosis not present

## 2013-12-19 DIAGNOSIS — I10 Essential (primary) hypertension: Secondary | ICD-10-CM | POA: Diagnosis not present

## 2013-12-19 DIAGNOSIS — Z1389 Encounter for screening for other disorder: Secondary | ICD-10-CM | POA: Diagnosis not present

## 2013-12-19 DIAGNOSIS — R252 Cramp and spasm: Secondary | ICD-10-CM | POA: Diagnosis not present

## 2013-12-19 DIAGNOSIS — Z6834 Body mass index (BMI) 34.0-34.9, adult: Secondary | ICD-10-CM | POA: Diagnosis not present

## 2013-12-19 DIAGNOSIS — E669 Obesity, unspecified: Secondary | ICD-10-CM | POA: Diagnosis not present

## 2013-12-19 DIAGNOSIS — Z0000 Encounter for general adult medical examination without abnormal findings: Secondary | ICD-10-CM | POA: Diagnosis not present

## 2013-12-23 DIAGNOSIS — Z1212 Encounter for screening for malignant neoplasm of rectum: Secondary | ICD-10-CM | POA: Diagnosis not present

## 2014-01-12 ENCOUNTER — Encounter: Payer: Self-pay | Admitting: Women's Health

## 2014-01-21 ENCOUNTER — Other Ambulatory Visit: Payer: Self-pay | Admitting: *Deleted

## 2014-01-21 DIAGNOSIS — C50419 Malignant neoplasm of upper-outer quadrant of unspecified female breast: Secondary | ICD-10-CM

## 2014-01-22 ENCOUNTER — Telehealth: Payer: Self-pay | Admitting: Oncology

## 2014-01-22 ENCOUNTER — Ambulatory Visit (HOSPITAL_BASED_OUTPATIENT_CLINIC_OR_DEPARTMENT_OTHER): Payer: Medicare Other | Admitting: Oncology

## 2014-01-22 ENCOUNTER — Other Ambulatory Visit (HOSPITAL_BASED_OUTPATIENT_CLINIC_OR_DEPARTMENT_OTHER): Payer: Medicare Other

## 2014-01-22 VITALS — BP 136/62 | HR 59 | Temp 98.5°F | Resp 18 | Ht 62.0 in | Wt 189.7 lb

## 2014-01-22 DIAGNOSIS — C50811 Malignant neoplasm of overlapping sites of right female breast: Secondary | ICD-10-CM

## 2014-01-22 DIAGNOSIS — C50419 Malignant neoplasm of upper-outer quadrant of unspecified female breast: Secondary | ICD-10-CM

## 2014-01-22 DIAGNOSIS — C50912 Malignant neoplasm of unspecified site of left female breast: Secondary | ICD-10-CM

## 2014-01-22 DIAGNOSIS — Z170 Estrogen receptor positive status [ER+]: Secondary | ICD-10-CM | POA: Diagnosis not present

## 2014-01-22 DIAGNOSIS — M8580 Other specified disorders of bone density and structure, unspecified site: Secondary | ICD-10-CM

## 2014-01-22 LAB — COMPREHENSIVE METABOLIC PANEL (CC13)
ALK PHOS: 107 U/L (ref 40–150)
ALT: 46 U/L (ref 0–55)
ANION GAP: 7 meq/L (ref 3–11)
AST: 32 U/L (ref 5–34)
Albumin: 3.9 g/dL (ref 3.5–5.0)
BUN: 16.3 mg/dL (ref 7.0–26.0)
CALCIUM: 9.6 mg/dL (ref 8.4–10.4)
CHLORIDE: 107 meq/L (ref 98–109)
CO2: 26 meq/L (ref 22–29)
Creatinine: 0.8 mg/dL (ref 0.6–1.1)
Glucose: 92 mg/dl (ref 70–140)
POTASSIUM: 4.1 meq/L (ref 3.5–5.1)
SODIUM: 140 meq/L (ref 136–145)
TOTAL PROTEIN: 6.6 g/dL (ref 6.4–8.3)
Total Bilirubin: 0.38 mg/dL (ref 0.20–1.20)

## 2014-01-22 LAB — CBC WITH DIFFERENTIAL/PLATELET
BASO%: 1.4 % (ref 0.0–2.0)
BASOS ABS: 0.1 10*3/uL (ref 0.0–0.1)
EOS ABS: 0.2 10*3/uL (ref 0.0–0.5)
EOS%: 3.2 % (ref 0.0–7.0)
HCT: 45.2 % (ref 34.8–46.6)
HGB: 14.7 g/dL (ref 11.6–15.9)
LYMPH%: 33.2 % (ref 14.0–49.7)
MCH: 29.8 pg (ref 25.1–34.0)
MCHC: 32.4 g/dL (ref 31.5–36.0)
MCV: 92.1 fL (ref 79.5–101.0)
MONO#: 0.5 10*3/uL (ref 0.1–0.9)
MONO%: 9.9 % (ref 0.0–14.0)
NEUT%: 52.3 % (ref 38.4–76.8)
NEUTROS ABS: 2.8 10*3/uL (ref 1.5–6.5)
Platelets: 180 10*3/uL (ref 145–400)
RBC: 4.91 10*6/uL (ref 3.70–5.45)
RDW: 12.7 % (ref 11.2–14.5)
WBC: 5.3 10*3/uL (ref 3.9–10.3)
lymph#: 1.7 10*3/uL (ref 0.9–3.3)

## 2014-01-22 MED ORDER — GABAPENTIN 300 MG PO CAPS: 300.0000 mg | ORAL_CAPSULE | Freq: Every day | ORAL | Status: DC

## 2014-01-22 NOTE — Progress Notes (Signed)
[facility]  [phone] [phone]) [phone]     ID: Joy Gonzales OB: 1940/05/30  MR#: [id number]  LHT#:[id number]  PCP: Joy Lopes, MD GYN:  Joy Gonzales.taken SU: Joy Gonzales OTHER MD: Joy Gonzales, Joy Gonzales, Joy Gonzales  CHIEF COMPLAINT: estrogen receptor positive breast cancer.  CURRENT TREATMENT: Anastrozole  BREAST CANCER HISTORY: From Dr. Pearlie Gonzales original intake note 06/28/2011:  "Joy Gonzales is a 73 y.o. female who recently self palpated a lump in the 12:00 position of her right breast which prompted a mammogram. She noticed this around beginning of February. Mammography on 06/09/2011 demonstrated a mass corresponding to the area of self palpation. On that same day, an ultrasound demonstrated a 1.4 x 0.9 x 1.0 cm mass in the 12:00 position. The patient underwent core needle biopsy and 06/20/2011. The pathology is notable for that described above. There was lymphovascular space invasion identified in the biopsy specimen. She underwent MRI of her breasts bilaterally and 06/23/2011. This demonstrated a 1.6 x 1.5 x 1.6 cm lesion in the 12:00 position of the right breast. There is no abnormal enhancement seen in the opposite breast. There is no enlarged axillary or internal mammary adenopathy. This is a solitary finding.   The patient has a history of bladder cancer which has been in remission since resection in 1999. 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 The patient also had reports a history of vaginal spotting this year which was explored by Joy Gonzales, workup negative for cancer."  The patient's subsequent history is as detailed below  INTERVAL HISTORY: Joy Gonzales returns today for followup of her early stage breast cancer.the interval history is generally unremarkable. She is tolerating the anastrozole well. She does have nighttime hot flashes which do tend to  wake her up. She also has significant vaginal dryness problems. This predated of course the anastrozole but it certainly has not helped. She does not have the arthralgias or myalgias that some patients can develop with this agent. She goes to the gym 3 times a week and enjoys the exercise is clear.  REVIEW OF SYSTEMS: She denies headaches, visual changes, dizziness, gait imbalance, or falls. She has a persistent dry cough. She thinks is due to reflux although she is already on Nexium and Zantac. There is no phlegm production no hemoptysis and no pleurisy. She is short of breath only when she climbs stairs, but she gets to the top without stopping. He has been no change in bowel or bladder habits. (She has irritable bowel symptoms; she is due for colonoscopy later this year).a detailed review of systems today was otherwise negative   PAST MEDICAL HISTORY: Past Medical History  Diagnosis Date  . HTN (hypertension)   . GERD (gastroesophageal reflux disease)   . Hyperlipemia   . Elevated LFTs   . Tinnitus   . Diverticulosis   . Vocal cord granuloma   . IBS (irritable bowel syndrome)   . Rectocele   . Lichen sclerosus   . Atrophic vaginitis   . HSV-1 infection   . Osteopenia   . Flushing   . Dyspnea     on exertion  . PONV (postoperative nausea and vomiting)   . Bladder cancer   . Breast cancer   . S/P radiation therapy 08/24/11 - 10/05/11    Right Breast: 50 Gy/25 Fractions with boost of 10Gy/5 Fractions  . Use of anastrozole (Arimidex) 10/25/11  PAST SURGICAL HISTORY: Past Surgical History  Procedure Laterality Date  . Tonsillectomy and adenoidectomy    . Throat surgery      LARYN. GRANULOMA  POLYP  . Cholecystectomy    . Bladder tumor excision    . Breast tumor removed    . Hysteroscopy w/d&c  05/18/2011    Procedure: DILATATION AND CURETTAGE /HYSTEROSCOPY;  Surgeon: Joy Laos, MD;  Location: [facility];  Service: Gynecology;  Laterality: N/A;  or Tuesday, March 12 7:30am?  or Thursday, March 14 7:30am? or Tuesday March 19 7:30am?  . Dilation and curettage of uterus    . Hysteroscopy    . Needle core biospy  06/20/2011    Right Breast, 12'Oclock - Invasive Mammary Carcinoma with Lymphovascular Invasion: ER/PR 100%, Ki67 50%  . 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 Cancer Father     ESOPHAGEAL  . Coronary artery disease Father   . Hypertension Father   . Heart failure Father   . Barrett's esophagus Brother   . Hypertension Brother   . Kidney cancer Sister   . Hypertension Sister   . Diabetes Sister   . Cancer Sister     kidney  . Hypertension Sister   . Stroke Sister   . Breast cancer Maternal Grandmother     Age 48's  . Breast cancer Maternal Aunt     Age 68's   the patient's mothers mother was diagnosed with breast cancer the age of 65, and a second aunts on the same side was diagnosed with breast cancer the age of 78. The patient's niece, Joy Gonzales, is valid here for triple negative breast cancer. She has been found to be BRCA negative. Joy Gonzales herself has not been tested as of yet  GYNECOLOGIC HISTORY:  Menarche age 62, first live birth age 20, the patient is Joy Gonzales P2. She went through menopause in her late 77s and took hormone replacement for about 10 years, and then Estring for about 5 years.   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 Her husband Joy Gonzales used to be in Press photographer for [employer]. He is retired as well. The patient's son is an intense or and runs his own company he are intact. Incidentally they tell me his wife is from [place] so those grandchildren are bilingual. The second son, Joy Gonzales, is disabled secondary to Crohn's disease. The patient has 4 grandchildren. She is a Tourist information centre manager    ADVANCED DIRECTIVES:    HEALTH  MAINTENANCE: History  Substance Use Topics  . Smoking status: Never Smoker   . Smokeless tobacco: Never Used  . Alcohol Use: No     Comment: very rare     Colonoscopy:  PAP:  Bone density:  Lipid panel:  Allergies  Allergen Reactions  . Adhesive [Tape] Rash  . Felodipine Er Palpitations    tachycardia    Current Outpatient Prescriptions  Medication Sig Dispense Refill  . ALPRAZolam (XANAX) 0.25 MG tablet Take 0.125 mg by mouth daily as needed. Take infrequently    . anastrozole (ARIMIDEX) 1 MG tablet TAKE 1 TABLET BY MOUTH EVERY DAY 90 tablet 0  . aspirin 81 MG tablet Take 81 mg by mouth daily.      . calcium carbonate ([place]) 420  MG CHEW Chew 420 mg by mouth.    . cholecalciferol (VITAMIN D) 400 UNITS TABS Take 1,000 Units by mouth.     . clobetasol cream (TEMOVATE) 0.05 % Apply topically 2 (two) times daily. 45 g 1  . esomeprazole (NEXIUM) 40 MG capsule Take 40 mg by mouth daily.      . fenofibrate micronized (LOFIBRA) 200 MG capsule Take 200 mg by mouth daily.      . fexofenadine (ALLEGRA) 180 MG tablet Take 180 mg by mouth as needed.      . metoprolol succinate (TOPROL XL) 25 MG 24 hr tablet Take 25 mg by mouth daily.      . Multiple Vitamin (MULTI-VITAMIN DAILY PO) Take 1 tablet by mouth daily.    Marland Kitchen olmesartan-hydrochlorothiazide (BENICAR HCT) 20-12.5 MG per tablet Take 1 tablet by mouth daily.    . ranitidine (ZANTAC) 75 MG tablet Take 75 mg by mouth at bedtime.     . valACYclovir (VALTREX) 500 MG tablet Take 1 tablet (500 mg total) by mouth 2 (two) times daily. When she has a fever blister only 30 tablet 2   No current facility-administered medications for this visit.    OBJECTIVE: Middle-aged white woman in no acute distress  Filed Vitals:   01/22/14 1009  BP: 136/62  Pulse: 59  Temp: 98.5 F (36.9 C)  Resp: 18     Body mass index is 34.69 kg/(m^2).    ECOG FS:0 - Asymptomatic   Sclerae unicteric, pupils equal and reactive Oropharynx clear and moist--  Teeth in good repair No cervical or supraclavicular adenopathy Lungs no rales or rhonchi Heart regular rate and rhythm Abd soft, obese,nontender, positive bowel sounds MSK no focal spinal tenderness, no upper extremity lymphedema Neuro: nonfocal, well oriented, positive affect Breasts: the right breast is status post lumpectomy and radiation. There is no evidence of local recurrence. The right axilla is benign.The left breast is unremarkable    LAB RESULTS:  CMP     Component Value Date/Time   NA 140 01/22/2014 0955   NA 143 08/21/2011 1544   K 4.1 01/22/2014 0955   K 3.5 08/21/2011 1544   CL 108* 07/25/2012 1001   CL 105 08/21/2011 1544   CO2 26 01/22/2014 0955   CO2 28 08/21/2011 1544   GLUCOSE 92 01/22/2014 0955   GLUCOSE 98 07/25/2012 1001   GLUCOSE 93 08/21/2011 1544   BUN 16.3 01/22/2014 0955   BUN 15 08/21/2011 1544   CREATININE 0.8 01/22/2014 0955   CREATININE 0.78 08/21/2011 1544   CALCIUM 9.6 01/22/2014 0955   CALCIUM 9.4 08/21/2011 1544   PROT 6.6 01/22/2014 0955   PROT 6.7 08/21/2011 1544   ALBUMIN 3.9 01/22/2014 0955   ALBUMIN 3.9 08/21/2011 1544   AST 32 01/22/2014 0955   AST 32 08/21/2011 1544   ALT 46 01/22/2014 0955   ALT 45* 08/21/2011 1544   ALKPHOS 107 01/22/2014 0955   ALKPHOS 85 08/21/2011 1544   BILITOT 0.38 01/22/2014 0955   BILITOT 0.2* 08/21/2011 1544   GFRNONAA 82* 07/03/2011 1330   GFRAA >90 07/03/2011 1330    I No results found for: SPEP  Lab Results  Component Value Date   WBC 5.3 01/22/2014   NEUTROABS 2.8 01/22/2014   HGB 14.7 01/22/2014   HCT 45.2 01/22/2014   MCV 92.1 01/22/2014   PLT 180 01/22/2014      Chemistry      Component Value Date/Time   NA 140 01/22/2014 0955   NA  143 08/21/2011 1544   K 4.1 01/22/2014 0955   K 3.5 08/21/2011 1544   CL 108* 07/25/2012 1001   CL 105 08/21/2011 1544   CO2 26 01/22/2014 0955   CO2 28 08/21/2011 1544   BUN 16.3 01/22/2014 0955   BUN 15 08/21/2011 1544   CREATININE 0.8  01/22/2014 0955   CREATININE 0.78 08/21/2011 1544      Component Value Date/Time   CALCIUM 9.6 01/22/2014 0955   CALCIUM 9.4 08/21/2011 1544   ALKPHOS 107 01/22/2014 0955   ALKPHOS 85 08/21/2011 1544   AST 32 01/22/2014 0955   AST 32 08/21/2011 1544   ALT 46 01/22/2014 0955   ALT 45* 08/21/2011 1544   BILITOT 0.38 01/22/2014 0955   BILITOT 0.2* 08/21/2011 1544       Lab Results  Component Value Date   LABCA2 12 06/28/2011    No components found for: [id number]  No results for input(s): INR in the last 168 hours.  Urinalysis No results found for: COLORURINE  STUDIES: No results found.  ASSESSMENT: 73 y.o. 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  (2) Oncotype DX score of 9 predicts a risk of outside the breast recurrence within 10 years of 6% if the patient's only systemic treatment is tamoxifen for 5 years. It also protects no benefit from chemotherapy   (3) the patient completed adjuvant radiation 10/05/2011  (4) started anastrozole August 2013  (5) DEXA scan at [facility] 11/21/2011 showed osteopenia  PLAN: Levada is doing well as far as breast cancer is concerned. Her tolerance of anastrozole is generally good. I gave her information on our "intimacy and pelvic health" program.  We did discuss gabapentin to be used at bedtime for the nighttime hot flashes. She has a good understanding of the possible toxicities, side effects ankle application so that agent. She will give it a try. I went ahead and put in the prescription for her.  We also discussed the possibility of switching to tamoxifen. She understands that this might help with the vaginal dryness problem and in fact might allow her to use vaginal estrogen preparations. It also  would help with the bone density issues. On the other hand she would have to worry about endometrial cancer and even though that is rare, and the possibility of blood clots.  She is considering but was not able to make a decision today. If she wishes to change before her next visit here she will let me know. I am also scheduling her to meet with our genetics counselor.  She has a good understanding of the overall plan. She agrees with it. She knows the goal of treatment in her case is cure. She will call with any problems that may develop before her next visit here.  Chauncey Cruel, MD   01/22/2014 10:46 AM

## 2014-01-22 NOTE — Telephone Encounter (Signed)
per pof to sch pt appt-gave pt copy of sch °

## 2014-01-23 ENCOUNTER — Telehealth: Payer: Self-pay | Admitting: *Deleted

## 2014-01-23 NOTE — Telephone Encounter (Signed)
Received request from Dr. Jana Hakim to schedule pt for genetics.  Called and left a message w/ pts husband Shanon Brow to have the pt call me back so I can get her scheduled.

## 2014-01-26 ENCOUNTER — Telehealth: Payer: Self-pay | Admitting: *Deleted

## 2014-01-26 NOTE — Telephone Encounter (Signed)
Pt returned my call and I confirmed 02/09/14 genetic appt w/ her.  Emailed Dr. Jana Hakim to make him aware.

## 2014-01-27 DIAGNOSIS — Z8379 Family history of other diseases of the digestive system: Secondary | ICD-10-CM | POA: Diagnosis not present

## 2014-01-27 DIAGNOSIS — Z800 Family history of malignant neoplasm of digestive organs: Secondary | ICD-10-CM | POA: Diagnosis not present

## 2014-01-27 DIAGNOSIS — K219 Gastro-esophageal reflux disease without esophagitis: Secondary | ICD-10-CM | POA: Diagnosis not present

## 2014-01-27 DIAGNOSIS — Z86010 Personal history of colonic polyps: Secondary | ICD-10-CM | POA: Diagnosis not present

## 2014-01-27 DIAGNOSIS — K589 Irritable bowel syndrome without diarrhea: Secondary | ICD-10-CM | POA: Diagnosis not present

## 2014-02-03 ENCOUNTER — Other Ambulatory Visit: Payer: Self-pay | Admitting: Gynecology

## 2014-02-03 ENCOUNTER — Ambulatory Visit (INDEPENDENT_AMBULATORY_CARE_PROVIDER_SITE_OTHER): Payer: Medicare Other

## 2014-02-03 DIAGNOSIS — Z5181 Encounter for therapeutic drug level monitoring: Secondary | ICD-10-CM | POA: Diagnosis not present

## 2014-02-03 DIAGNOSIS — M810 Age-related osteoporosis without current pathological fracture: Secondary | ICD-10-CM | POA: Diagnosis not present

## 2014-02-03 DIAGNOSIS — Z79811 Long term (current) use of aromatase inhibitors: Secondary | ICD-10-CM

## 2014-02-03 DIAGNOSIS — Z853 Personal history of malignant neoplasm of breast: Secondary | ICD-10-CM | POA: Diagnosis not present

## 2014-02-03 DIAGNOSIS — M8580 Other specified disorders of bone density and structure, unspecified site: Secondary | ICD-10-CM

## 2014-02-09 ENCOUNTER — Ambulatory Visit (HOSPITAL_BASED_OUTPATIENT_CLINIC_OR_DEPARTMENT_OTHER): Payer: Medicare Other | Admitting: Genetic Counselor

## 2014-02-09 ENCOUNTER — Encounter: Payer: Self-pay | Admitting: Genetic Counselor

## 2014-02-09 ENCOUNTER — Other Ambulatory Visit: Payer: Medicare Other

## 2014-02-09 DIAGNOSIS — C50912 Malignant neoplasm of unspecified site of left female breast: Secondary | ICD-10-CM

## 2014-02-09 DIAGNOSIS — C50811 Malignant neoplasm of overlapping sites of right female breast: Secondary | ICD-10-CM | POA: Diagnosis not present

## 2014-02-09 DIAGNOSIS — Z800 Family history of malignant neoplasm of digestive organs: Secondary | ICD-10-CM | POA: Diagnosis not present

## 2014-02-09 DIAGNOSIS — Z8551 Personal history of malignant neoplasm of bladder: Secondary | ICD-10-CM

## 2014-02-09 DIAGNOSIS — Z8051 Family history of malignant neoplasm of kidney: Secondary | ICD-10-CM

## 2014-02-09 DIAGNOSIS — Z803 Family history of malignant neoplasm of breast: Secondary | ICD-10-CM | POA: Diagnosis not present

## 2014-02-09 DIAGNOSIS — Z853 Personal history of malignant neoplasm of breast: Secondary | ICD-10-CM | POA: Diagnosis not present

## 2014-02-09 NOTE — Progress Notes (Signed)
REFERRING PROVIDER: Leanna Battles, MD [address] [place], [place] [address]  PRIMARY PROVIDER:  Donnajean Lopes, MD  PRIMARY REASON FOR VISIT:  1. Breast cancer, left   2. Personal history of bladder cancer   3. Family history of breast cancer   4. Family history of kidney cancer      HISTORY OF PRESENT ILLNESS:   Joy Gonzales, a 73 y.o. female, was seen for a [facility] cancer genetics consultation at the request of Dr. Philip Aspen due to a personal and family history of cancer.  Joy Gonzales presents to clinic today to discuss the possibility of a hereditary predisposition to cancer, genetic testing, and to further clarify her future cancer risks, as well as potential cancer risks for family members. Joy Gonzales has a niece who had triple negative breast cancer and reportedly underwent genetic testing and was negative.  This was about a year or two ago.  CANCER HISTORY:   No history exists.    No history exists.     HISTORY OF PRESENT ILLNESS: In 1999, at the age of 77, Joy Gonzales was diagnosed with bladder cancer. In 2013 at the age of 25, Joy Gonzales was diagnosed with breast cancer.  The tumor was ER+/PR+/Her2-.  This was treated with lumpectomy and 30 rounds of radiation.   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  The last was in 2012.  Joy Gonzales reports having bloody discharge in 2013 for which she had a D&C.  This was reportedly normal.   HORMONAL RISK FACTORS:  Menarche was at age 8.  First live birth at age 35.  OCP use for approximately 0 years.  Ovaries intact: yes.  Hysterectomy: yes.  Menopausal status: postmenopausal.  HRT use: 10 years. Colonoscopy: yes; abnormal. Mammogram within the last year: yes.   Past Medical History  Diagnosis Date  . HTN (hypertension)   . GERD (gastroesophageal reflux disease)   . Hyperlipemia   . Elevated LFTs   . Tinnitus   . Diverticulosis    . Vocal cord granuloma   . IBS (irritable bowel syndrome)   . Rectocele   . Lichen sclerosus   . Atrophic vaginitis   . HSV-1 infection   . Osteopenia   . Flushing   . Dyspnea     on exertion  . PONV (postoperative nausea and vomiting)   . Bladder cancer   . Breast cancer   . S/P radiation therapy 08/24/11 - 10/05/11    Right Breast: 50 Gy/25 Fractions with boost of 10Gy/5 Fractions  . Use of anastrozole (Arimidex) 10/25/11    Past Surgical History  Procedure Laterality Date  . Tonsillectomy and adenoidectomy    . Throat surgery      LARYN. GRANULOMA  POLYP  . Cholecystectomy    . Bladder tumor excision    . Breast tumor removed    . Hysteroscopy w/d&c  05/18/2011    Procedure: DILATATION AND CURETTAGE /HYSTEROSCOPY;  Surgeon: Bennetta Laos, MD;  Location: [facility];  Service: Gynecology;  Laterality: N/A;  or Tuesday, March 12 7:30am? or Thursday, March 14 7:30am? or Tuesday March 19 7:30am?  . Dilation and curettage of uterus    . Hysteroscopy    . Needle core biospy  06/20/2011    Right Breast, 12'Oclock - Invasive Mammary Carcinoma with Lymphovascular Invasion: ER/PR 100%, Ki67 50%  . 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 Marital Status: Married    Spouse Name: N/A    Number of Children: 2  . Years of Education: N/A   Occupational History  . homemaker    Social History Main Topics  . Smoking status: Never Smoker   . Smokeless tobacco: Never Used  . Alcohol Use: No     Comment: very rare  . Drug Use: No  . Sexual Activity: No     Comment: seldom   Other Topics Concern  . None   Social History Narrative     FAMILY HISTORY:  We obtained a detailed, 4-generation family history.  Significant diagnoses are listed below: Family History  Problem Relation Age of Onset  . Coronary artery disease Father   .  Hypertension Father   . Heart failure Father   . Esophageal cancer Father 69  . Barrett's esophagus Brother   . Hypertension Brother   . Kidney cancer Sister 76  . Hypertension Sister   . Diabetes Sister   . Hypertension Sister   . Stroke Sister   . Breast cancer Maternal Grandmother 64  . Breast cancer Maternal Aunt     Age 78's  . Stomach cancer Paternal Uncle   . Breast cancer Other 55    triple negative; negative genetic testing  . Lung cancer Other 42  . Lung cancer Paternal Uncle   . Breast cancer Cousin     2 paternal cousins dx and died in their 18s  . Brain cancer Cousin     Patient's maternal ancestors are of [place] and [place] descent, and paternal ancestors are of [place] and [place] descent. There is no reported Ashkenazi Jewish ancestry. There is no known consanguinity.  GENETIC COUNSELING ASSESSMENT: Joy Gonzales is a 73 y.o. female with a personal history of breast and bladder cancer and family history of kidney, breast, lung, stomach breast and brain cancer which somewhat suggestive of a hereditary cancer syndrome and predisposition to cancer. We, therefore, discussed and recommended the following at today's visit.   DISCUSSION: We reviewed the characteristics, features and inheritance patterns of hereditary cancer syndromes. We also discussed genetic testing, including the appropriate family members to test, the process of testing, insurance coverage and turn-around-time for results. We discussed the implications of a negative, positive and/or variant of uncertain significant result. We reviewed several cancer syndromes associated with the cancer's in Joy Gonzales's family including BRCA, PTEN, and Lynch syndrome.  We recommended Joy Gonzales pursue genetic testing for the OvaNext gene panel.   PLAN: After considering the risks, benefits, and limitations,Joy Gonzales  provided informed consent to pursue genetic testing and the blood sample was sent to [facility] for  analysis of the OvaNext Panel. Results should be available within approximately 3-4 weeks' time, at which point they will be disclosed by telephone to Joy Gonzales, as will any additional recommendations warranted by these results. Joy Gonzales will receive a summary of her genetic counseling visit and a copy of her results once available. This information will also be available in Epic. We encouraged Joy Gonzales to remain in contact with cancer genetics annually so that we can continuously update the family history and inform her of any changes in cancer genetics and testing that may be of benefit for her family. Joy Gonzales questions were answered to her satisfaction  today. Our contact information was provided should additional questions or concerns arise.  Lastly, we encouraged Joy Gonzales to remain in contact with cancer genetics annually so that we can continuously update the family history and inform her of any changes in cancer genetics and testing that may be of benefit for this family.   Joy Gonzales questions were answered to her satisfaction today. Our contact information was provided should additional questions or concerns arise. Thank you for the referral and allowing [place] to share in the care of your patient.   Karen P. Florene Glen, [place], [facility] Certified Genetic Counselor [email or web address] phone: [phone]  The patient was seen for a total of 60 minutes in face-to-face genetic counseling.  This patient was discussed with Drs. Magrinat, Lindi Adie and/or Burr Medico who agrees with the above.    _______________________________________________________________________ For Office Staff:  Number of people involved in session: 1 Was an Intern/ student involved with case: no

## 2014-02-13 DIAGNOSIS — K219 Gastro-esophageal reflux disease without esophagitis: Secondary | ICD-10-CM | POA: Diagnosis not present

## 2014-02-13 DIAGNOSIS — Z800 Family history of malignant neoplasm of digestive organs: Secondary | ICD-10-CM | POA: Diagnosis not present

## 2014-02-13 DIAGNOSIS — Z86010 Personal history of colonic polyps: Secondary | ICD-10-CM | POA: Diagnosis not present

## 2014-02-13 DIAGNOSIS — Z538 Procedure and treatment not carried out for other reasons: Secondary | ICD-10-CM | POA: Diagnosis not present

## 2014-02-13 DIAGNOSIS — Z09 Encounter for follow-up examination after completed treatment for conditions other than malignant neoplasm: Secondary | ICD-10-CM | POA: Diagnosis not present

## 2014-02-16 ENCOUNTER — Other Ambulatory Visit: Payer: Self-pay | Admitting: Gastroenterology

## 2014-02-16 ENCOUNTER — Other Ambulatory Visit: Payer: Self-pay | Admitting: Gynecology

## 2014-02-16 DIAGNOSIS — IMO0001 Reserved for inherently not codable concepts without codable children: Secondary | ICD-10-CM

## 2014-02-16 DIAGNOSIS — R05 Cough: Secondary | ICD-10-CM

## 2014-02-16 DIAGNOSIS — K219 Gastro-esophageal reflux disease without esophagitis: Principal | ICD-10-CM

## 2014-02-16 DIAGNOSIS — R053 Chronic cough: Secondary | ICD-10-CM

## 2014-02-16 DIAGNOSIS — M810 Age-related osteoporosis without current pathological fracture: Secondary | ICD-10-CM

## 2014-02-19 ENCOUNTER — Other Ambulatory Visit: Payer: Medicare Other

## 2014-02-19 DIAGNOSIS — M810 Age-related osteoporosis without current pathological fracture: Secondary | ICD-10-CM

## 2014-02-20 ENCOUNTER — Other Ambulatory Visit: Payer: Self-pay | Admitting: Gynecology

## 2014-02-20 DIAGNOSIS — E559 Vitamin D deficiency, unspecified: Secondary | ICD-10-CM

## 2014-02-20 LAB — PTH, INTACT AND CALCIUM
CALCIUM: 9.2 mg/dL (ref 8.4–10.5)
PTH: 26 pg/mL (ref 14–64)

## 2014-02-20 LAB — VITAMIN D 25 HYDROXY (VIT D DEFICIENCY, FRACTURES): Vit D, 25-Hydroxy: 26 ng/mL — ABNORMAL LOW (ref 30–100)

## 2014-02-20 MED ORDER — VITAMIN D (ERGOCALCIFEROL) 1.25 MG (50000 UNIT) PO CAPS: 50000.0000 [IU] | ORAL_CAPSULE | ORAL | Status: DC

## 2014-02-25 ENCOUNTER — Other Ambulatory Visit: Payer: Self-pay | Admitting: Gastroenterology

## 2014-02-25 ENCOUNTER — Ambulatory Visit
Admission: RE | Admit: 2014-02-25 | Discharge: 2014-02-25 | Disposition: A | Discharge status: Home / Self Care | Payer: Medicare Other | Source: Ambulatory Visit | Attending: Gastroenterology | Admitting: Gastroenterology

## 2014-02-25 DIAGNOSIS — K219 Gastro-esophageal reflux disease without esophagitis: Secondary | ICD-10-CM | POA: Diagnosis not present

## 2014-02-25 DIAGNOSIS — IMO0001 Reserved for inherently not codable concepts without codable children: Secondary | ICD-10-CM

## 2014-02-25 DIAGNOSIS — R05 Cough: Secondary | ICD-10-CM

## 2014-02-25 DIAGNOSIS — R053 Chronic cough: Secondary | ICD-10-CM

## 2014-02-25 DIAGNOSIS — K449 Diaphragmatic hernia without obstruction or gangrene: Secondary | ICD-10-CM | POA: Diagnosis not present

## 2014-02-26 ENCOUNTER — Other Ambulatory Visit: Payer: Self-pay | Admitting: *Deleted

## 2014-02-26 DIAGNOSIS — C50411 Malignant neoplasm of upper-outer quadrant of right female breast: Secondary | ICD-10-CM

## 2014-02-26 MED ORDER — ANASTROZOLE 1 MG PO TABS: 1.0000 mg | ORAL_TABLET | Freq: Every day | ORAL | Status: DC

## 2014-03-11 ENCOUNTER — Telehealth: Payer: Self-pay | Admitting: Genetic Counselor

## 2014-03-11 ENCOUNTER — Encounter: Payer: Self-pay | Admitting: Genetic Counselor

## 2014-03-11 DIAGNOSIS — Z1379 Encounter for other screening for genetic and chromosomal anomalies: Secondary | ICD-10-CM | POA: Insufficient documentation

## 2014-03-11 NOTE — Progress Notes (Signed)
HPI: Ms. Doell was previously seen in the [facility] clinic due to a personal and family history of cancer and concerns regarding a hereditary predisposition to cancer. Please refer to our prior cancer genetics clinic note for more information regarding Ms. Pardo's medical, social and family histories, and our assessment and recommendations, at the time. Ms. Kossman recent genetic test results were disclosed to her, as were recommendations warranted by these results. These results and recommendations are discussed in more detail below.  GENETIC TEST RESULTS: At the time of Ms. Barlowe's visit, we recommended she pursue genetic testing of the OvaNext gene panel. This test, which included sequencing and deletion/duplication analysis of the following genes:  ATM, BARD1, BRCA1, BRCA2, BRIP1, CDH1, CHEK2, EPCAM, MLH1, MRE11A, MSH2, MSH6, MUTYH, NBN, NF1, PALB2, PMS2, PTEN, RAD50, RAD51C, RAD51D, SMARCA4, STK11, and TP53.  The report date is 03/10/2014.  Testing was performed at [facility]. Genetic testing was normal, and did not reveal a deleterious mutation in these genes. The test report has been scanned into EPIC and is located under the Media tab.   We discussed with Ms. Kurihara that since the current genetic testing is not perfect, it is possible there may be a gene mutation in one of these genes that current testing cannot detect, but that chance is small. We also discussed, that it is possible that another gene that has not yet been discovered, or that we have not yet tested, is responsible for the cancer diagnoses in the family, and it is, therefore, important to remain in touch with cancer genetics in the future so that we can continue to offer Ms. Hardgrove the most up to date genetic testing.   CANCER SCREENING RECOMMENDATIONS: This result is reassuring and suggests that Ms. Malta's cancer was most likely not due to an inherited predisposition associated with one of these genes.  Most cancers happen by chance and this negative test, along with details of her family history, suggests that her cancer falls into this category. We, therefore, recommended she continue to follow the cancer management and screening guidelines provided by her oncology and primary providers.   RECOMMENDATIONS FOR FAMILY MEMBERS: Women in this family might be at some increased risk of developing cancer, over the general population risk, simply due to the family history of cancer. We recommended women in this family have a yearly mammogram beginning at age 61, an an annual clinical breast exam, and perform monthly breast self-exams. Women in this family should also have a gynecological exam as recommended by their primary provider. All family members should have a colonoscopy by age 24.  FOLLOW-UP: Lastly, we discussed with Ms. Biedermann that cancer genetics is a rapidly advancing field and it is possible that new genetic tests will be appropriate for her and/or her family members in the future. We encouraged her to remain in contact with cancer genetics on an annual basis so we can update her personal and family histories and let her know of advances in cancer genetics that may benefit this family.   Our contact number was provided. Ms.. Parisi's questions were answered to her satisfaction, and she knows she is welcome to call us at anytime with additional questions or concerns.   Roma Kayser, MS, [facility] Certified Genetic Counselor [email or web address]

## 2014-03-11 NOTE — Telephone Encounter (Signed)
Revealed negative genetic testing on the OvaNext panel test.

## 2014-03-17 ENCOUNTER — Other Ambulatory Visit: Payer: Self-pay | Admitting: *Deleted

## 2014-03-17 DIAGNOSIS — B009 Herpesviral infection, unspecified: Secondary | ICD-10-CM

## 2014-03-17 MED ORDER — VALACYCLOVIR HCL 500 MG PO TABS: 500.0000 mg | ORAL_TABLET | Freq: Two times a day (BID) | ORAL | Status: DC

## 2014-03-24 DIAGNOSIS — Z23 Encounter for immunization: Secondary | ICD-10-CM | POA: Diagnosis not present

## 2014-05-25 ENCOUNTER — Other Ambulatory Visit: Payer: Self-pay | Admitting: Oncology

## 2014-05-25 ENCOUNTER — Other Ambulatory Visit: Payer: Self-pay | Admitting: Gynecology

## 2014-05-25 DIAGNOSIS — E559 Vitamin D deficiency, unspecified: Secondary | ICD-10-CM

## 2014-05-25 NOTE — Telephone Encounter (Signed)
Last OV 11/12 Next OV 5/19. Chart reviewed

## 2014-05-26 ENCOUNTER — Other Ambulatory Visit: Payer: Medicare Other

## 2014-05-26 DIAGNOSIS — E559 Vitamin D deficiency, unspecified: Secondary | ICD-10-CM

## 2014-05-27 LAB — VITAMIN D 25 HYDROXY (VIT D DEFICIENCY, FRACTURES): VIT D 25 HYDROXY: 32 ng/mL (ref 30–100)

## 2014-06-03 DIAGNOSIS — C50911 Malignant neoplasm of unspecified site of right female breast: Secondary | ICD-10-CM | POA: Diagnosis not present

## 2014-06-15 ENCOUNTER — Other Ambulatory Visit: Payer: Self-pay | Admitting: Oncology

## 2014-06-15 ENCOUNTER — Other Ambulatory Visit: Payer: Self-pay | Admitting: Internal Medicine

## 2014-06-15 DIAGNOSIS — C50911 Malignant neoplasm of unspecified site of right female breast: Secondary | ICD-10-CM

## 2014-06-15 DIAGNOSIS — Z9889 Other specified postprocedural states: Secondary | ICD-10-CM

## 2014-06-23 DIAGNOSIS — K5792 Diverticulitis of intestine, part unspecified, without perforation or abscess without bleeding: Secondary | ICD-10-CM | POA: Diagnosis not present

## 2014-06-23 DIAGNOSIS — K219 Gastro-esophageal reflux disease without esophagitis: Secondary | ICD-10-CM | POA: Diagnosis not present

## 2014-06-23 DIAGNOSIS — R197 Diarrhea, unspecified: Secondary | ICD-10-CM | POA: Diagnosis not present

## 2014-07-22 ENCOUNTER — Telehealth: Payer: Self-pay | Admitting: Oncology

## 2014-07-22 NOTE — Telephone Encounter (Signed)
Patient called to reschedule appointment due to husband possibly having surgery. patient confirmed 06/30 & 07/07.

## 2014-07-23 ENCOUNTER — Other Ambulatory Visit: Payer: Medicare Other

## 2014-07-30 ENCOUNTER — Ambulatory Visit: Payer: Medicare Other | Admitting: Oncology

## 2014-08-20 ENCOUNTER — Ambulatory Visit
Admission: RE | Admit: 2014-08-20 | Discharge: 2014-08-20 | Disposition: A | Discharge status: Home / Self Care | Payer: Medicare Other | Source: Ambulatory Visit | Attending: Oncology | Admitting: Oncology

## 2014-08-20 DIAGNOSIS — C50911 Malignant neoplasm of unspecified site of right female breast: Secondary | ICD-10-CM

## 2014-08-20 DIAGNOSIS — R921 Mammographic calcification found on diagnostic imaging of breast: Secondary | ICD-10-CM | POA: Diagnosis not present

## 2014-08-20 DIAGNOSIS — Z853 Personal history of malignant neoplasm of breast: Secondary | ICD-10-CM | POA: Diagnosis not present

## 2014-08-20 DIAGNOSIS — Z9889 Other specified postprocedural states: Secondary | ICD-10-CM

## 2014-09-04 DIAGNOSIS — L738 Other specified follicular disorders: Secondary | ICD-10-CM | POA: Diagnosis not present

## 2014-09-04 DIAGNOSIS — D2262 Melanocytic nevi of left upper limb, including shoulder: Secondary | ICD-10-CM | POA: Diagnosis not present

## 2014-09-04 DIAGNOSIS — L720 Epidermal cyst: Secondary | ICD-10-CM | POA: Diagnosis not present

## 2014-09-04 DIAGNOSIS — L570 Actinic keratosis: Secondary | ICD-10-CM | POA: Diagnosis not present

## 2014-09-04 DIAGNOSIS — D1801 Hemangioma of skin and subcutaneous tissue: Secondary | ICD-10-CM | POA: Diagnosis not present

## 2014-09-04 DIAGNOSIS — L820 Inflamed seborrheic keratosis: Secondary | ICD-10-CM | POA: Diagnosis not present

## 2014-09-04 DIAGNOSIS — Z85828 Personal history of other malignant neoplasm of skin: Secondary | ICD-10-CM | POA: Diagnosis not present

## 2014-09-04 DIAGNOSIS — L821 Other seborrheic keratosis: Secondary | ICD-10-CM | POA: Diagnosis not present

## 2014-09-10 ENCOUNTER — Other Ambulatory Visit: Payer: Medicare Other

## 2014-09-11 ENCOUNTER — Other Ambulatory Visit (HOSPITAL_BASED_OUTPATIENT_CLINIC_OR_DEPARTMENT_OTHER): Payer: Medicare Other

## 2014-09-11 DIAGNOSIS — C50811 Malignant neoplasm of overlapping sites of right female breast: Secondary | ICD-10-CM

## 2014-09-11 DIAGNOSIS — C50912 Malignant neoplasm of unspecified site of left female breast: Secondary | ICD-10-CM

## 2014-09-11 LAB — COMPREHENSIVE METABOLIC PANEL (CC13)
ALK PHOS: 94 U/L (ref 40–150)
ALT: 35 U/L (ref 0–55)
AST: 30 U/L (ref 5–34)
Albumin: 3.5 g/dL (ref 3.5–5.0)
Anion Gap: 9 mEq/L (ref 3–11)
BILIRUBIN TOTAL: 0.43 mg/dL (ref 0.20–1.20)
BUN: 15.5 mg/dL (ref 7.0–26.0)
CO2: 25 mEq/L (ref 22–29)
Calcium: 9.6 mg/dL (ref 8.4–10.4)
Chloride: 107 mEq/L (ref 98–109)
Creatinine: 0.8 mg/dL (ref 0.6–1.1)
EGFR: 72 mL/min/{1.73_m2} — AB (ref >90)
Glucose: 98 mg/dl (ref 70–140)
Potassium: 4.2 mEq/L (ref 3.5–5.1)
Sodium: 141 mEq/L (ref 136–145)
TOTAL PROTEIN: 6.5 g/dL (ref 6.4–8.3)

## 2014-09-11 LAB — CBC WITH DIFFERENTIAL/PLATELET
BASO%: 1.3 % (ref 0.0–2.0)
BASOS ABS: 0.1 10*3/uL (ref 0.0–0.1)
EOS%: 5.5 % (ref 0.0–7.0)
Eosinophils Absolute: 0.3 10*3/uL (ref 0.0–0.5)
HCT: 42.8 % (ref 34.8–46.6)
HGB: 14.5 g/dL (ref 11.6–15.9)
LYMPH#: 1.5 10*3/uL (ref 0.9–3.3)
LYMPH%: 31.7 % (ref 14.0–49.7)
MCH: 31.1 pg (ref 25.1–34.0)
MCHC: 33.9 g/dL (ref 31.5–36.0)
MCV: 91.8 fL (ref 79.5–101.0)
MONO#: 0.6 10*3/uL (ref 0.1–0.9)
MONO%: 11.9 % (ref 0.0–14.0)
NEUT%: 49.6 % (ref 38.4–76.8)
NEUTROS ABS: 2.3 10*3/uL (ref 1.5–6.5)
PLATELETS: 192 10*3/uL (ref 145–400)
RBC: 4.66 10*6/uL (ref 3.70–5.45)
RDW: 12.8 % (ref 11.2–14.5)
WBC: 4.7 10*3/uL (ref 3.9–10.3)

## 2014-09-17 ENCOUNTER — Ambulatory Visit (HOSPITAL_BASED_OUTPATIENT_CLINIC_OR_DEPARTMENT_OTHER): Payer: Medicare Other | Admitting: Oncology

## 2014-09-17 ENCOUNTER — Telehealth: Payer: Self-pay | Admitting: General Practice

## 2014-09-17 ENCOUNTER — Telehealth: Payer: Self-pay | Admitting: Oncology

## 2014-09-17 VITALS — BP 123/74 | HR 66 | Temp 98.3°F | Resp 18 | Ht 62.0 in | Wt 185.5 lb

## 2014-09-17 DIAGNOSIS — C50811 Malignant neoplasm of overlapping sites of right female breast: Secondary | ICD-10-CM | POA: Diagnosis not present

## 2014-09-17 DIAGNOSIS — Z8551 Personal history of malignant neoplasm of bladder: Secondary | ICD-10-CM | POA: Diagnosis not present

## 2014-09-17 DIAGNOSIS — Z8051 Family history of malignant neoplasm of kidney: Secondary | ICD-10-CM

## 2014-09-17 DIAGNOSIS — Z803 Family history of malignant neoplasm of breast: Secondary | ICD-10-CM | POA: Diagnosis not present

## 2014-09-17 DIAGNOSIS — Z171 Estrogen receptor negative status [ER-]: Secondary | ICD-10-CM | POA: Diagnosis not present

## 2014-09-17 DIAGNOSIS — Z79811 Long term (current) use of aromatase inhibitors: Secondary | ICD-10-CM

## 2014-09-17 DIAGNOSIS — C50411 Malignant neoplasm of upper-outer quadrant of right female breast: Secondary | ICD-10-CM

## 2014-09-17 MED ORDER — TAMOXIFEN CITRATE 20 MG PO TABS: 20.0000 mg | ORAL_TABLET | Freq: Every day | ORAL | Status: AC

## 2014-09-17 NOTE — Progress Notes (Signed)
Joy  [phone] Fax:(336) [phone]     ID: ANDEE Gonzales OB: 1941/03/03  MR#: [id number]  ZHY#:[id number]  PCP: Joy Lopes, MD GYN:  Joy Gonzales.taken SU: Excell Seltzer OTHER MD: Peyton Bottoms, Peter Martinique, [place] Buccini  CHIEF COMPLAINT: estrogen receptor positive breast cancer.  CURRENT TREATMENT: Anastrozole  BREAST CANCER HISTORY: From Dr. Pearlie Oyster original intake note 06/28/2011:  "Joy Gonzales is a 74 y.o. female who recently self palpated a lump in the 12:00 position of her right breast which prompted a mammogram. She noticed this around beginning of February. Mammography on 06/09/2011 demonstrated a mass corresponding to the area of self palpation. On that same day, an ultrasound demonstrated a 1.4 x 0.9 x 1.0 cm mass in the 12:00 position. The patient underwent core needle biopsy and 06/20/2011. The pathology is notable for that described above. There was lymphovascular space invasion identified in the biopsy specimen. She underwent MRI of her breasts bilaterally and 06/23/2011. This demonstrated a 1.6 x 1.5 x 1.6 cm lesion in the 12:00 position of the right breast. There is no abnormal enhancement seen in the opposite breast. There is no enlarged axillary or internal mammary adenopathy. This is a solitary finding.   The patient has a history of bladder cancer which has been in remission since resection in 1999. 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 The patient also had reports a history of vaginal spotting this year which was explored by [facility], workup negative for cancer."  The patient's subsequent history is as detailed below  INTERVAL HISTORY: Joy Gonzales returns today for followup of her early stage breast cancer. she recently had her routine mammography and it did show an area suggestive of fat necrosis. That scan of the followed with a 6 month repeat.  In addition she had a bone density late last year under Dr. Rudi Rummage, which shows frank osteoporosis. He has increased her vitamin D level. Today we are going to discuss switching to tamoxifen  REVIEW OF SYSTEMS:   PAST MEDICAL HISTORY: Past Medical History  Diagnosis Date  . HTN (hypertension)   . GERD (gastroesophageal reflux disease)   . Hyperlipemia   . Elevated LFTs   . Tinnitus   . Diverticulosis   . Vocal cord granuloma   . IBS (irritable bowel syndrome)   . Rectocele   . Lichen sclerosus   . Atrophic vaginitis   . HSV-1 infection   . Osteopenia   . Flushing   . Dyspnea     on exertion  . PONV (postoperative nausea and vomiting)   . Bladder cancer   . Breast cancer   . S/P radiation therapy 08/24/11 - 10/05/11    Right Breast: 50 Gy/25 Fractions with boost of 10Gy/5 Fractions  . Use of anastrozole (Arimidex) 10/25/11    PAST SURGICAL HISTORY: Past Surgical History  Procedure Laterality Date  . Tonsillectomy and adenoidectomy    . Throat surgery      LARYN. GRANULOMA  POLYP  . Cholecystectomy    . Bladder tumor excision    . Breast tumor removed    . Hysteroscopy w/d&c  05/18/2011    Procedure: DILATATION AND CURETTAGE /HYSTEROSCOPY;  Surgeon: Joy Laos, MD;  Location: [facility];  Service: Gynecology;  Laterality: N/A;  or Tuesday, March 12 7:30am? or Thursday, March 14 7:30am? or Tuesday March 19 7:30am?  . Dilation and curettage of uterus    .  Hysteroscopy    . Needle core biospy  06/20/2011    Right Breast, 12'Oclock - Invasive Mammary Carcinoma with Lymphovascular Invasion: ER/PR 100%, Ki67 50%  . 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 Coronary artery disease Father   . Hypertension Father   . Heart failure Father   . Esophageal cancer Father 17  . Barrett's esophagus  Brother   . Hypertension Brother   . Kidney cancer Sister 21  . Hypertension Sister   . Diabetes Sister   . Hypertension Sister   . Stroke Sister   . Breast cancer Maternal Grandmother 24  . Breast cancer Maternal Aunt     Age 80's  . Stomach cancer Paternal Uncle   . Breast cancer Other 55    triple negative; negative genetic testing  . Lung cancer Other 61  . Lung cancer Paternal Uncle   . Breast cancer Cousin     2 paternal cousins dx and died in their 51s  . Brain cancer Cousin    the patient's mothers mother was diagnosed with breast cancer the age of 56, and a second aunts on the same side was diagnosed with breast cancer the age of 67. The patient's niece, Joy Gonzales, is valid here for triple negative breast cancer. She has been found to be BRCA negative. Joy Gonzales herself has not been tested as of yet  GYNECOLOGIC HISTORY:  Menarche age 9, first live birth age 14, the patient is Joy Gonzales P2. She went through menopause in her late 59s and took hormone replacement for about 10 years, and then Estring for about 5 years.   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 Her husband Joy Gonzales used to be in Press photographer for [employer]. He is retired as well. The patient's son is an intense or and runs his own company he are intact. Incidentally they tell me his wife is from [place] so those grandchildren are bilingual. The second son, Joy Gonzales, is disabled secondary to Crohn's disease. The patient has 4 grandchildren. She is a Tourist information centre manager    ADVANCED DIRECTIVES: in place   HEALTH MAINTENANCE: History  Substance Use Topics  . Smoking status: Never Smoker   . Smokeless tobacco: Never Used  . Alcohol Use: No     Comment: very rare     Colonoscopy:  PAP:  Bone density:  Lipid panel:  Allergies  Allergen Reactions  . Adhesive [Tape] Rash  . Felodipine Er Palpitations    tachycardia    Current Outpatient Prescriptions  Medication Sig Dispense Refill  .  ALPRAZolam (XANAX) 0.25 MG tablet Take 0.125 mg by mouth daily as needed. Take infrequently    . aspirin 81 MG tablet Take 81 mg by mouth daily.      . cholecalciferol (VITAMIN D) 400 UNITS TABS Take 2,000 Units by mouth.    . clobetasol cream (TEMOVATE) 0.05 % Apply topically 2 (two) times daily. 45 g 1  . esomeprazole (NEXIUM) 40 MG capsule Take 40 mg by mouth daily.      . fenofibrate micronized (LOFIBRA) 200 MG capsule Take 200 mg by mouth daily.      . fexofenadine (ALLEGRA) 180 MG tablet Take 180 mg by mouth as needed.      . metoprolol  succinate (TOPROL XL) 25 MG 24 hr tablet Take 25 mg by mouth daily.      . Multiple Vitamin (MULTI-VITAMIN DAILY PO) Take 1 tablet by mouth daily.    Marland Kitchen olmesartan-hydrochlorothiazide (BENICAR HCT) 20-12.5 MG per tablet Take 1 tablet by mouth daily.    . valACYclovir (VALTREX) 500 MG tablet Take 1 tablet (500 mg total) by mouth 2 (two) times daily. When she has a fever blister only 30 tablet 2   No current facility-administered medications for this visit.    OBJECTIVE: Middle-aged white woman who appears well Filed Vitals:   09/17/14 1015  BP: 123/74  Pulse: 66  Temp: 98.3 F (36.8 C)  Resp: 18     Body mass index is 33.92 kg/(m^2).    ECOG FS:0 - Asymptomatic   Sclerae unicteric, EOMs intact Oropharynx clear, dentition in good repair No cervical or supraclavicular adenopathy Lungs no rales or rhonchi Heart regular rate and rhythm Abd soft, obese, nontender, positive bowel sounds MSK no focal spinal tenderness, no upper extremity lymphedema Neuro: nonfocal, well oriented, appropriate affect Breasts: the right breast is status post lumpectomy and radiation. I do not palpate any suspicious finding. There are no skin or nipple changes of concern. The right axilla is benign per the left breast is unremarkable.   LAB RESULTS:  CMP     Component Value Date/Time   NA 141 09/11/2014 0921   NA 143 08/21/2011 1544   K 4.2 09/11/2014 0921   K 3.5  08/21/2011 1544   CL 108* 07/25/2012 1001   CL 105 08/21/2011 1544   CO2 25 09/11/2014 0921   CO2 28 08/21/2011 1544   GLUCOSE 98 09/11/2014 0921   GLUCOSE 98 07/25/2012 1001   GLUCOSE 93 08/21/2011 1544   BUN 15.5 09/11/2014 0921   BUN 15 08/21/2011 1544   CREATININE 0.8 09/11/2014 0921   CREATININE 0.78 08/21/2011 1544   CALCIUM 9.6 09/11/2014 0921   CALCIUM 9.2 02/19/2014 1107   PROT 6.5 09/11/2014 0921   PROT 6.7 08/21/2011 1544   ALBUMIN 3.5 09/11/2014 0921   ALBUMIN 3.9 08/21/2011 1544   AST 30 09/11/2014 0921   AST 32 08/21/2011 1544   ALT 35 09/11/2014 0921   ALT 45* 08/21/2011 1544   ALKPHOS 94 09/11/2014 0921   ALKPHOS 85 08/21/2011 1544   BILITOT 0.43 09/11/2014 0921   BILITOT 0.2* 08/21/2011 1544   GFRNONAA 82* 07/03/2011 1330   GFRAA >90 07/03/2011 1330    I No results found for: SPEP  Lab Results  Component Value Date   WBC 4.7 09/11/2014   NEUTROABS 2.3 09/11/2014   HGB 14.5 09/11/2014   HCT 42.8 09/11/2014   MCV 91.8 09/11/2014   PLT 192 09/11/2014      Chemistry      Component Value Date/Time   NA 141 09/11/2014 0921   NA 143 08/21/2011 1544   K 4.2 09/11/2014 0921   K 3.5 08/21/2011 1544   CL 108* 07/25/2012 1001   CL 105 08/21/2011 1544   CO2 25 09/11/2014 0921   CO2 28 08/21/2011 1544   BUN 15.5 09/11/2014 0921   BUN 15 08/21/2011 1544   CREATININE 0.8 09/11/2014 0921   CREATININE 0.78 08/21/2011 1544      Component Value Date/Time   CALCIUM 9.6 09/11/2014 0921   CALCIUM 9.2 02/19/2014 1107   ALKPHOS 94 09/11/2014 0921   ALKPHOS 85 08/21/2011 1544   AST 30 09/11/2014 0921   AST 32 08/21/2011 1544  ALT 35 09/11/2014 0921   ALT 45* 08/21/2011 1544   BILITOT 0.43 09/11/2014 0921   BILITOT 0.2* 08/21/2011 1544       Lab Results  Component Value Date   LABCA2 12 06/28/2011    No components found for: EHMCN470  No results for input(s): INR in the last 168 hours.  Urinalysis No results found for:  COLORURINE  STUDIES: Mm Diag Breast Tomo Bilateral  08/20/2014   CLINICAL DATA:  Patient with history of right breast lumpectomy 2013.  EXAM: DIGITAL DIAGNOSTIC BILATERAL MAMMOGRAM WITH 3D TOMOSYNTHESIS AND CAD  COMPARISON:  [facility]  ACR Breast Density Category b: There are scattered areas of fibroglandular density.  FINDINGS: Postlumpectomy changes right breast. Within the lumpectomy site there are new faint calcifications which appear to be located within the periphery of an area of fat necrosis/oil cyst. No additional concerning masses, calcifications or architectural distortion identified within either breast.  Mammographic images were processed with CAD.  IMPRESSION: Calcifications at the lumpectomy site are favored to represent early fat necrosis.  No mammographic evidence for malignancy.  RECOMMENDATION: Right breast diagnostic mammography in 6 months with magnification views of the lumpectomy site to further evaluate evolution of probable fat necrosis.  I have discussed the findings and recommendations with the patient. Results were also provided in writing at the conclusion of the visit. If applicable, a reminder letter will be sent to the patient regarding the next appointment.  BI-RADS CATEGORY  3: Probably benign.   Electronically Signed   By: Lovey Newcomer M.D.   On: 08/20/2014 09:29    ASSESSMENT: 74 y.o. BRCA negative Joy Gonzales woman   (1) status post right lumpectomy and sentinel lymph node dissection 07/05/2011 for a pT1c pN0, stage IA invasive ductal carcinoma, grade 3, estrogen receptor and progesterone receptor both 100% positive, HER-2 not amplified, with an MIB-1 of 50%.  (2) Oncotype DX score of 9 predicts a risk of outside the breast recurrence within 10 years of 6% if the patient's only systemic treatment is tamoxifen for 5 years. It also protects no benefit from chemotherapy   (3) the patient completed adjuvant radiation 10/05/2011  (4) started anastrozole August 2013  (5)  DEXA [facility] 02/03/2014 showed osteoporosis  (6) genetics testing through the [place] gene panel sent 03/10/2014 found no deleterious mutation in ATM, BARD1, BRCA1, BRCA2, BRIP1, CDH1, CHEK2, EPCAM, MLH1, MRE11A, MSH2, MSH6, MUTYH, NBN, NF1, PALB2, PMS2, PTEN, RAD50, RAD51C, RAD51D, SMARCA4, STK11, and TP53.  PLAN: Joy Gonzales is now more than 3 years out from her definitive surgery with no evidence of disease recurrence. This is very favorable.  She has been on anastrozole just about 3 years. We certainly have the option of switching to tamoxifen. The advantage would be that her bone density now shows osteoporosis, and while anastrozole makes it worse tamoxifen tends to make that better.  A second possible benefit from tamoxifen is that it would allow her to use Estring as she did before. I feel it is quite safe to do that while under coverage of tamoxifen.  We then discussed the possible toxicities side effects and complications of tamoxifen including risks of blood clots and concerns regarding thickening of the uterine lining, uterine polyps, and the rare cases of uterine cancer.  After this discussion she is very much interested in switching so we went ahead and place the prescription in for her. She is going to see my 14 assistant in 3 months and assuming she is tolerating tamoxifen well  at that time she will see me again a year from now.  Angellina has a good understanding of this plan. She agrees with it. She will call with any problems that may develop before her next visit here.  Chauncey Cruel, MD   09/17/2014 10:25 AM

## 2014-11-11 DIAGNOSIS — C50911 Malignant neoplasm of unspecified site of right female breast: Secondary | ICD-10-CM | POA: Diagnosis not present

## 2014-11-24 ENCOUNTER — Encounter: Payer: Medicare Other | Admitting: Women's Health

## 2014-12-10 ENCOUNTER — Ambulatory Visit (INDEPENDENT_AMBULATORY_CARE_PROVIDER_SITE_OTHER): Payer: Medicare Other | Admitting: Women's Health

## 2014-12-10 ENCOUNTER — Encounter: Payer: Self-pay | Admitting: Women's Health

## 2014-12-10 VITALS — BP 124/80 | Ht 62.0 in | Wt 187.0 lb

## 2014-12-10 DIAGNOSIS — Z01419 Encounter for gynecological examination (general) (routine) without abnormal findings: Secondary | ICD-10-CM | POA: Diagnosis not present

## 2014-12-10 DIAGNOSIS — M8580 Other specified disorders of bone density and structure, unspecified site: Secondary | ICD-10-CM

## 2014-12-10 DIAGNOSIS — Z780 Asymptomatic menopausal state: Secondary | ICD-10-CM

## 2014-12-10 DIAGNOSIS — Z853 Personal history of malignant neoplasm of breast: Secondary | ICD-10-CM | POA: Diagnosis not present

## 2014-12-10 NOTE — Progress Notes (Signed)
Joy Gonzales 09-Feb-1941 [id number]    History:    Presents for breast and pelvic exam. Postmenopausal on no HRT with no bleeding. 2013 right breast cancer with lumpectomy and radiation on tamoxifen. Mammogram 08/2014 questionable fat necrosis has follow-up mammogram scheduled 02/2015. 2010 bladder cancer with negative cystoscopies after. 2015 T score -2, -1.8 hip average FRAX 10%/1.9%. Normal Pap history. Hypertension and hyperlipidemia primary care manages. Not sexually active.  Past medical history, past surgical history, family history and social history were all reviewed and documented in the EPIC chart. Hoping to downsize lives in a large home with large property in [place], contemplating retirement living. 2 sons both live here in [place].  ROS:  A ROS was performed and pertinent positives and negatives are included.  Exam:  Filed Vitals:   12/10/14 1029  BP: 124/80    General appearance:  Normal Thyroid:  Symmetrical, normal in size, without palpable masses or nodularity. Respiratory  Auscultation:  Clear without wheezing or rhonchi Cardiovascular  Auscultation:  Regular rate, without rubs, murmurs or gallops  Edema/varicosities:  Not grossly evident Abdominal  Soft,nontender, without masses, guarding or rebound.  Liver/spleen:  No organomegaly noted  Hernia:  None appreciated  Skin  Inspection:  Grossly normal   Breasts: Examined lying and sitting.     Right: Right nipple changes, no discharge or axillary adenopathy.     Left: Without masses, retractions, discharge or axillary adenopathy. Gentitourinary   Inguinal/mons:  Normal without inguinal adenopathy  External genitalia:  Normal  BUS/Urethra/Skene's glands:  Normal  Vagina:  Atrophic  Cervix:  Normal  Uterus:   normal in size, shape and contour.  Midline and mobile  Adnexa/parametria:     Rt: Without masses or tenderness.   Lt: Without masses or tenderness.  Anus and perineum: Normal  Digital rectal  exam: Normal sphincter tone without palpated masses or tenderness  Assessment/Plan:  74 y.o. MWF G2 P2 for breast and pelvic exam with no complaints.  Postmenopausal/no HRT/no bleeding/not sexually active 2013 right breast cancer -  lumpectomy and radiation on tamoxifen Osteopenia without elevated FRAX 2000- bladder cancer urologist manages Hypertension/hyperlipidemia primary care manages labs and meds   Plan: SBE's, report changes, keep scheduled follow-up mammogram in December. Continue to have regular exercise, calcium rich diet, vitamin D 1000 daily encouraged. Home safety, fall prevention and importance of weightbearing exercise reviewed. UA. Encouraged decrease carbs for weight loss.     Huel Cote [facility], 11:24 AM 12/10/2014

## 2014-12-10 NOTE — Addendum Note (Signed)
Addended by: Burnett Kanaris on: 12/10/2014 03:57 PM   Modules accepted: Orders

## 2014-12-10 NOTE — Patient Instructions (Signed)
Health Recommendations for Postmenopausal Women Respected and ongoing research has looked at the most common causes of death, disability, and poor quality of life in postmenopausal women. The causes include heart disease, diseases of blood vessels, diabetes, depression, cancer, and bone loss (osteoporosis). Many things can be done to help lower the chances of developing these and other common problems. CARDIOVASCULAR DISEASE Heart Disease: A heart attack is a medical emergency. Know the signs and symptoms of a heart attack. Below are things women can do to reduce their risk for heart disease.   Do not smoke. If you smoke, quit.  Aim for a healthy weight. Being overweight causes many preventable deaths. Eat a healthy and balanced diet and drink an adequate amount of liquids.  Get moving. Make a commitment to be more physically active. Aim for 30 minutes of activity on most, if not all days of the week.  Eat for heart health. Choose a diet that is low in saturated fat and cholesterol and eliminate trans fat. Include whole grains, vegetables, and fruits. Read and understand the labels on food containers before buying.  Know your numbers. Ask your caregiver to check your blood pressure, cholesterol (total, HDL, LDL, triglycerides) and blood glucose. Work with your caregiver on improving your entire clinical picture.  High blood pressure. Limit or stop your table salt intake (try salt substitute and food seasonings). Avoid salty foods and drinks. Read labels on food containers before buying. Eating well and exercising can help control high blood pressure. STROKE  Stroke is a medical emergency. Stroke may be the result of a blood clot in a blood vessel in the brain or by a brain hemorrhage (bleeding). Know the signs and symptoms of a stroke. To lower the risk of developing a stroke:  Avoid fatty foods.  Quit smoking.  Control your diabetes, blood pressure, and irregular heart rate. THROMBOPHLEBITIS  (BLOOD CLOT) OF THE LEG  Becoming overweight and leading a stationary lifestyle may also contribute to developing blood clots. Controlling your diet and exercising will help lower the risk of developing blood clots. CANCER SCREENING  Breast Cancer: Take steps to reduce your risk of breast cancer.  You should practice "breast self-awareness." This means understanding the normal appearance and feel of your breasts and should include breast self-examination. Any changes detected, no matter how small, should be reported to your caregiver.  After age 40, you should have a clinical breast exam (CBE) every year.  Starting at age 40, you should consider having a mammogram (breast X-ray) every year.  If you have a family history of breast cancer, talk to your caregiver about genetic screening.  If you are at high risk for breast cancer, talk to your caregiver about having an MRI and a mammogram every year.  Intestinal or Stomach Cancer: Tests to consider are a rectal exam, fecal occult blood, sigmoidoscopy, and colonoscopy. Women who are high risk may need to be screened at an earlier age and more often.  Cervical Cancer:  Beginning at age 30, you should have a Pap test every 3 years as long as the past 3 Pap tests have been normal.  If you have had past treatment for cervical cancer or a condition that could lead to cancer, you need Pap tests and screening for cancer for at least 20 years after your treatment.  If you had a hysterectomy for a problem that was not cancer or a condition that could lead to cancer, then you no longer need Pap tests.    If you are between ages 65 and 70, and you have had normal Pap tests going back 10 years, you no longer need Pap tests.  If Pap tests have been discontinued, risk factors (such as a new sexual partner) need to be reassessed to determine if screening should be resumed.  Some medical problems can increase the chance of getting cervical cancer. In these  cases, your caregiver may recommend more frequent screening and Pap tests.  Uterine Cancer: If you have vaginal bleeding after reaching menopause, you should notify your caregiver.  Ovarian Cancer: Other than yearly pelvic exams, there are no reliable tests available to screen for ovarian cancer at this time except for yearly pelvic exams.  Lung Cancer: Yearly chest X-rays can detect lung cancer and should be done on high risk women, such as cigarette smokers and women with chronic lung disease (emphysema).  Skin Cancer: A complete body skin exam should be done at your yearly examination. Avoid overexposure to the sun and ultraviolet light lamps. Use a strong sun block cream when in the sun. All of these things are important for lowering the risk of skin cancer. MENOPAUSE Menopause Symptoms: Hormone therapy products are effective for treating symptoms associated with menopause:  Moderate to severe hot flashes.  Night sweats.  Mood swings.  Headaches.  Tiredness.  Loss of sex drive.  Insomnia.  Other symptoms. Hormone replacement carries certain risks, especially in older women. Women who use or are thinking about using estrogen or estrogen with progestin treatments should discuss that with their caregiver. Your caregiver will help you understand the benefits and risks. The ideal dose of hormone replacement therapy is not known. The Food and Drug Administration (FDA) has concluded that hormone therapy should be used only at the lowest doses and for the shortest amount of time to reach treatment goals.  OSTEOPOROSIS Protecting Against Bone Loss and Preventing Fracture If you use hormone therapy for prevention of bone loss (osteoporosis), the risks for bone loss must outweigh the risk of the therapy. Ask your caregiver about other medications known to be safe and effective for preventing bone loss and fractures. To guard against bone loss or fractures, the following is recommended:  If  you are younger than age 50, take 1000 mg of calcium and at least 600 mg of Vitamin D per day.  If you are older than age 50 but younger than age 70, take 1200 mg of calcium and at least 600 mg of Vitamin D per day.  If you are older than age 70, take 1200 mg of calcium and at least 800 mg of Vitamin D per day. Smoking and excessive alcohol intake increases the risk of osteoporosis. Eat foods rich in calcium and vitamin D and do weight bearing exercises several times a week as your caregiver suggests. DIABETES Diabetes Mellitus: If you have type I or type 2 diabetes, you should keep your blood sugar under control with diet, exercise, and recommended medication. Avoid starchy and fatty foods, and too many sweets. Being overweight can make diabetes control more difficult. COGNITION AND MEMORY Cognition and Memory: Menopausal hormone therapy is not recommended for the prevention of cognitive disorders such as Alzheimer's disease or memory loss.  DEPRESSION  Depression may occur at any age, but it is common in elderly women. This may be because of physical, medical, social (loneliness), or financial problems and needs. If you are experiencing depression because of medical problems and control of symptoms, talk to your caregiver about this. Physical   activity and exercise may help with mood and sleep. Community and volunteer involvement may improve your sense of value and worth. If you have depression and you feel that the problem is getting worse or becoming severe, talk to your caregiver about which treatment options are best for you. ACCIDENTS  Accidents are common and can be serious in elderly woman. Prepare your house to prevent accidents. Eliminate throw rugs, place hand bars in bath, shower, and toilet areas. Avoid wearing high heeled shoes or walking on wet, snowy, and icy areas. Limit or stop driving if you have vision or hearing problems, or if you feel you are unsteady with your movements and  reflexes. HEPATITIS C Hepatitis C is a type of viral infection affecting the liver. It is spread mainly through contact with blood from an infected person. It can be treated, but if left untreated, it can lead to severe liver damage over the years. Many people who are infected do not know that the virus is in their blood. If you are a "baby-boomer", it is recommended that you have one screening test for Hepatitis C. IMMUNIZATIONS  Several immunizations are important to consider having during your senior years, including:   Tetanus, diphtheria, and pertussis booster shot.  Influenza every year before the flu season begins.  Pneumonia vaccine.  Shingles vaccine.  Others, as indicated based on your specific needs. Talk to your caregiver about these. Document Released: 04/21/2005 Document Revised: 07/14/2013 Document Reviewed: 12/16/2007 ExitCare Patient Information 2015 [employer]. This information is not intended to replace advice given to you by your health care provider. Make sure you discuss any questions you have with your health care provider.  

## 2014-12-11 ENCOUNTER — Other Ambulatory Visit: Payer: Self-pay | Admitting: *Deleted

## 2014-12-11 DIAGNOSIS — L900 Lichen sclerosus et atrophicus: Secondary | ICD-10-CM

## 2014-12-11 MED ORDER — CLOBETASOL PROPIONATE 0.05 % EX CREA: TOPICAL_CREAM | Freq: Two times a day (BID) | CUTANEOUS | Status: DC

## 2014-12-17 ENCOUNTER — Encounter: Payer: Self-pay | Admitting: Nurse Practitioner

## 2014-12-17 ENCOUNTER — Ambulatory Visit (HOSPITAL_BASED_OUTPATIENT_CLINIC_OR_DEPARTMENT_OTHER): Payer: Medicare Other | Admitting: Nurse Practitioner

## 2014-12-17 ENCOUNTER — Other Ambulatory Visit (HOSPITAL_BASED_OUTPATIENT_CLINIC_OR_DEPARTMENT_OTHER): Payer: Medicare Other

## 2014-12-17 ENCOUNTER — Telehealth: Payer: Self-pay | Admitting: Oncology

## 2014-12-17 VITALS — BP 114/50 | HR 69 | Temp 98.5°F | Resp 18 | Ht 62.0 in | Wt 188.6 lb

## 2014-12-17 DIAGNOSIS — C50811 Malignant neoplasm of overlapping sites of right female breast: Secondary | ICD-10-CM | POA: Diagnosis not present

## 2014-12-17 DIAGNOSIS — R8299 Other abnormal findings in urine: Secondary | ICD-10-CM | POA: Diagnosis not present

## 2014-12-17 DIAGNOSIS — Z170 Estrogen receptor positive status [ER+]: Secondary | ICD-10-CM | POA: Diagnosis not present

## 2014-12-17 DIAGNOSIS — E785 Hyperlipidemia, unspecified: Secondary | ICD-10-CM | POA: Diagnosis not present

## 2014-12-17 DIAGNOSIS — C50411 Malignant neoplasm of upper-outer quadrant of right female breast: Secondary | ICD-10-CM

## 2014-12-17 DIAGNOSIS — I10 Essential (primary) hypertension: Secondary | ICD-10-CM | POA: Diagnosis not present

## 2014-12-17 DIAGNOSIS — Z23 Encounter for immunization: Secondary | ICD-10-CM

## 2014-12-17 DIAGNOSIS — C50912 Malignant neoplasm of unspecified site of left female breast: Secondary | ICD-10-CM

## 2014-12-17 DIAGNOSIS — N390 Urinary tract infection, site not specified: Secondary | ICD-10-CM | POA: Diagnosis not present

## 2014-12-17 LAB — COMPREHENSIVE METABOLIC PANEL (CC13)
ALT: 23 U/L (ref 0–55)
ANION GAP: 10 meq/L (ref 3–11)
AST: 20 U/L (ref 5–34)
Albumin: 3.8 g/dL (ref 3.5–5.0)
Alkaline Phosphatase: 108 U/L (ref 40–150)
BILIRUBIN TOTAL: 0.5 mg/dL (ref 0.20–1.20)
BUN: 15.5 mg/dL (ref 7.0–26.0)
CHLORIDE: 106 meq/L (ref 98–109)
CO2: 22 meq/L (ref 22–29)
CREATININE: 0.9 mg/dL (ref 0.6–1.1)
Calcium: 8.9 mg/dL (ref 8.4–10.4)
EGFR: 66 mL/min/{1.73_m2} — ABNORMAL LOW (ref >90)
GLUCOSE: 159 mg/dL — AB (ref 70–140)
Potassium: 4 mEq/L (ref 3.5–5.1)
SODIUM: 139 meq/L (ref 136–145)
TOTAL PROTEIN: 6.4 g/dL (ref 6.4–8.3)

## 2014-12-17 LAB — CBC WITH DIFFERENTIAL/PLATELET
BASO%: 1.6 % (ref 0.0–2.0)
Basophils Absolute: 0.1 10*3/uL (ref 0.0–0.1)
EOS%: 1.9 % (ref 0.0–7.0)
Eosinophils Absolute: 0.1 10*3/uL (ref 0.0–0.5)
HCT: 43 % (ref 34.8–46.6)
HGB: 14.4 g/dL (ref 11.6–15.9)
LYMPH%: 32.4 % (ref 14.0–49.7)
MCH: 31.2 pg (ref 25.1–34.0)
MCHC: 33.5 g/dL (ref 31.5–36.0)
MCV: 93.3 fL (ref 79.5–101.0)
MONO#: 0.3 10*3/uL (ref 0.1–0.9)
MONO%: 6 % (ref 0.0–14.0)
NEUT%: 58.1 % (ref 38.4–76.8)
NEUTROS ABS: 3 10*3/uL (ref 1.5–6.5)
PLATELETS: 173 10*3/uL (ref 145–400)
RBC: 4.61 10*6/uL (ref 3.70–5.45)
RDW: 13.3 % (ref 11.2–14.5)
WBC: 5.2 10*3/uL (ref 3.9–10.3)
lymph#: 1.7 10*3/uL (ref 0.9–3.3)

## 2014-12-17 MED ORDER — INFLUENZA VAC SPLIT QUAD 0.5 ML IM SUSY
0.5000 mL | PREFILLED_SYRINGE | Freq: Once | INTRAMUSCULAR | Status: AC
  Administered 2014-12-17: 0.5 mL via INTRAMUSCULAR
  Filled 2014-12-17: qty 0.5

## 2014-12-17 NOTE — Telephone Encounter (Signed)
Appointments made and avs pritned for pateint °

## 2014-12-17 NOTE — Progress Notes (Signed)
[place]  Telephone:(336) [phone] Fax:(336) [phone]     ID: Joy Gonzales OB: 12-19-1940  MR#: [id number]  HUD#:[id number]  PCP: Donnajean Lopes, MD GYN:  Joy Gonzales.taken SU: Joy Gonzales OTHER MD: Joy Gonzales, Joy Gonzales, Joy Gonzales  CHIEF COMPLAINT: estrogen receptor positive breast cancer.  CURRENT TREATMENT: tamoxifen  BREAST CANCER HISTORY: From Dr. Pearlie Oyster original intake note 06/28/2011:  "Joy Gonzales is a 74 y.o. female who recently self palpated a lump in the 12:00 position of her right breast which prompted a mammogram. She noticed this around beginning of February. Mammography on 06/09/2011 demonstrated a mass corresponding to the area of self palpation. On that same day, an ultrasound demonstrated a 1.4 x 0.9 x 1.0 cm mass in the 12:00 position. The patient underwent core needle biopsy and 06/20/2011. The pathology is notable for that described above. There was lymphovascular space invasion identified in the biopsy specimen. She underwent MRI of her breasts bilaterally and 06/23/2011. This demonstrated a 1.6 x 1.5 x 1.6 cm lesion in the 12:00 position of the right breast. There is no abnormal enhancement seen in the opposite breast. There is no enlarged axillary or internal mammary adenopathy. This is a solitary finding.   The patient has a history of bladder cancer which has been in remission since resection in 1999. 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 The patient also had reports a history of vaginal spotting this year which was explored by [facility], workup negative for cancer."  The patient's subsequent history is as detailed below  INTERVAL HISTORY: Joy Gonzales returns today for follow up of her early stage breast cancer. At her last visit, 3 months ago, she was switched from anastrozole to tamoxifen. She believes she is tolerating this much better. Her vaginal  dryness is resolved. She has occasional hot flashes, but manages these well. The interval history is generally unremarkable. She continues to walk at the gym 3 times weekly, and does the occasional Zumba class.  REVIEW OF SYSTEMS: Joy Gonzales denies fevers, chills, nausea, vomiting, or changes in bowel or bladder habits. She has no pain. She denies shortness of breath, chest pain, cough, or palpitations. She has no headaches, dizziness, or vision changes. Her mood is stable. A detailed review of systems is otherwise stable.  PAST MEDICAL HISTORY: Past Medical History  Diagnosis Date  . HTN (hypertension)   . GERD (gastroesophageal reflux disease)   . Hyperlipemia   . Elevated LFTs   . Tinnitus   . Diverticulosis   . Vocal cord granuloma   . IBS (irritable bowel syndrome)   . Rectocele   . Lichen sclerosus   . Atrophic vaginitis   . HSV-1 infection   . Osteopenia   . Flushing   . Dyspnea     on exertion  . PONV (postoperative nausea and vomiting)   . Bladder cancer   . Breast cancer   . S/P radiation therapy 08/24/11 - 10/05/11    Right Breast: 50 Gy/25 Fractions with boost of 10Gy/5 Fractions  . Use of anastrozole (Arimidex) 10/25/11    PAST SURGICAL HISTORY: Past Surgical History  Procedure Laterality Date  . Tonsillectomy and adenoidectomy    . Throat surgery      LARYN. GRANULOMA  POLYP  . Cholecystectomy    . Bladder tumor excision    . Breast tumor removed    . Hysteroscopy w/d&c  05/18/2011  Procedure: DILATATION AND CURETTAGE /HYSTEROSCOPY;  Surgeon: Joy Laos, MD;  Location: [facility];  Service: Gynecology;  Laterality: N/A;  or Tuesday, March 12 7:30am? or Thursday, March 14 7:30am? or Tuesday March 19 7:30am?  . Dilation and curettage of uterus    . Hysteroscopy    . Needle core biospy  06/20/2011    Right Breast, 12'Oclock - Invasive Mammary Carcinoma with Lymphovascular Invasion: ER/PR 100%, Ki67 50%  . 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 Coronary artery disease Father   . Hypertension Father   . Heart failure Father   . Esophageal cancer Father 20  . Barrett's esophagus Brother   . Hypertension Brother   . Kidney cancer Sister 31  . Hypertension Sister   . Diabetes Sister   . Hypertension Sister   . Stroke Sister   . Breast cancer Maternal Grandmother 30  . Breast cancer Maternal Aunt     Age 37's  . Stomach cancer Paternal Uncle   . Breast cancer Other 55    triple negative; negative genetic testing  . Lung cancer Other 63  . Lung cancer Paternal Uncle   . Breast cancer Cousin     2 paternal cousins dx and died in their 9s  . Brain cancer Cousin    the patient's mothers mother was diagnosed with breast cancer the age of 12, and a second aunts on the same side was diagnosed with breast cancer the age of 7. The patient's niece, Joy Gonzales, is valid here for triple negative breast cancer. She has been found to be BRCA negative. Joy Gonzales herself has not been tested as of yet  GYNECOLOGIC HISTORY:  Menarche age 75, first live birth age 60, the patient is Joy Gonzales P2. She went through menopause in her late 79s and took hormone replacement for about 10 years, and then Estring for about 5 years.   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 Her husband Joy Gonzales used to be in Press photographer for [employer]. He is retired as well. The patient's son is an intense or and runs his own company he are intact. Incidentally they tell me his wife is from [place] so those grandchildren are bilingual. The second son, Joy Gonzales, is disabled secondary to Crohn's disease. The patient has 4 grandchildren. She is a Tourist information centre manager    ADVANCED DIRECTIVES: in place   HEALTH MAINTENANCE: Social History  Substance Use Topics  . Smoking status: Never Smoker   .  Smokeless tobacco: Never Used  . Alcohol Use: No     Comment: very rare     Colonoscopy:  PAP:  Bone density:  Lipid panel:  Allergies  Allergen Reactions  . Adhesive [Tape] Rash  . Felodipine Er Palpitations    tachycardia    Current Outpatient Prescriptions  Medication Sig Dispense Refill  . ALPRAZolam (XANAX) 0.25 MG tablet Take 0.125 mg by mouth daily as needed. Take infrequently    . aspirin 81 MG tablet Take 81 mg by mouth daily.      . cholecalciferol (VITAMIN D) 400 UNITS TABS Take 2,000 Units by mouth.    . clobetasol cream (TEMOVATE) 0.05 % Apply topically 2 (two) times daily. 45 g 1  . esomeprazole (NEXIUM) 40 MG capsule  Take 40 mg by mouth daily.      . fenofibrate micronized (LOFIBRA) 200 MG capsule Take 200 mg by mouth daily.      . fexofenadine (ALLEGRA) 180 MG tablet Take 180 mg by mouth as needed.      . metoprolol succinate (TOPROL XL) 25 MG 24 hr tablet Take 25 mg by mouth daily.      . Multiple Vitamin (MULTI-VITAMIN DAILY PO) Take 1 tablet by mouth daily.    Marland Kitchen olmesartan-hydrochlorothiazide (BENICAR HCT) 20-12.5 MG per tablet Take 1 tablet by mouth daily.    . tamoxifen (NOLVADEX) 20 MG tablet Take 20 mg by mouth daily.    . valACYclovir (VALTREX) 500 MG tablet Take 1 tablet (500 mg total) by mouth 2 (two) times daily. When she has a fever blister only (Patient not taking: Reported on 12/17/2014) 30 tablet 2   Current Facility-Administered Medications  Medication Dose Route Frequency Provider Last Rate Last Dose  . Influenza vac split quadrivalent PF (FLUARIX) injection 0.5 mL  0.5 mL Intramuscular Once Laurie Panda, NP        OBJECTIVE: Middle-aged white woman who appears well Filed Vitals:   12/17/14 0959  BP: 114/50  Pulse: 69  Temp: 98.5 F (36.9 C)  Resp: 18     Body mass index is 34.49 kg/(m^2).    ECOG FS:0 - Asymptomatic  Skin: warm, dry  HEENT: sclerae anicteric, conjunctivae pink, oropharynx clear. No thrush or mucositis.  Lymph  Nodes: No cervical or supraclavicular lymphadenopathy  Lungs: clear to auscultation bilaterally, no rales, wheezes, or rhonci  Heart: regular rate and rhythm  Abdomen: round, soft, non tender, positive bowel sounds  Musculoskeletal: No focal spinal tenderness, no peripheral edema  Neuro: non focal, well oriented, positive affect  Breasts: right breast status post lumpectomy and radiation. No evidence of recurrent disease. Right axilla benign. Left breast unremarkable.  LAB RESULTS:  CMP     Component Value Date/Time   NA 139 12/17/2014 0941   NA 143 08/21/2011 1544   K 4.0 12/17/2014 0941   K 3.5 08/21/2011 1544   CL 108* 07/25/2012 1001   CL 105 08/21/2011 1544   CO2 22 12/17/2014 0941   CO2 28 08/21/2011 1544   GLUCOSE 159* 12/17/2014 0941   GLUCOSE 98 07/25/2012 1001   GLUCOSE 93 08/21/2011 1544   BUN 15.5 12/17/2014 0941   BUN 15 08/21/2011 1544   CREATININE 0.9 12/17/2014 0941   CREATININE 0.78 08/21/2011 1544   CALCIUM 8.9 12/17/2014 0941   CALCIUM 9.2 02/19/2014 1107   PROT 6.4 12/17/2014 0941   PROT 6.7 08/21/2011 1544   ALBUMIN 3.8 12/17/2014 0941   ALBUMIN 3.9 08/21/2011 1544   AST 20 12/17/2014 0941   AST 32 08/21/2011 1544   ALT 23 12/17/2014 0941   ALT 45* 08/21/2011 1544   ALKPHOS 108 12/17/2014 0941   ALKPHOS 85 08/21/2011 1544   BILITOT 0.50 12/17/2014 0941   BILITOT 0.2* 08/21/2011 1544   GFRNONAA 82* 07/03/2011 1330   GFRAA >90 07/03/2011 1330    I No results found for: SPEP  Lab Results  Component Value Date   WBC 5.2 12/17/2014   NEUTROABS 3.0 12/17/2014   HGB 14.4 12/17/2014   HCT 43.0 12/17/2014   MCV 93.3 12/17/2014   PLT 173 12/17/2014      Chemistry      Component Value Date/Time   NA 139 12/17/2014 0941   NA 143 08/21/2011 1544   K 4.0 12/17/2014 0941  K 3.5 08/21/2011 1544   CL 108* 07/25/2012 1001   CL 105 08/21/2011 1544   CO2 22 12/17/2014 0941   CO2 28 08/21/2011 1544   BUN 15.5 12/17/2014 0941   BUN 15 08/21/2011  1544   CREATININE 0.9 12/17/2014 0941   CREATININE 0.78 08/21/2011 1544      Component Value Date/Time   CALCIUM 8.9 12/17/2014 0941   CALCIUM 9.2 02/19/2014 1107   ALKPHOS 108 12/17/2014 0941   ALKPHOS 85 08/21/2011 1544   AST 20 12/17/2014 0941   AST 32 08/21/2011 1544   ALT 23 12/17/2014 0941   ALT 45* 08/21/2011 1544   BILITOT 0.50 12/17/2014 0941   BILITOT 0.2* 08/21/2011 1544       Lab Results  Component Value Date   LABCA2 12 06/28/2011    No components found for: ASTMH962  No results for input(s): INR in the last 168 hours.  Urinalysis No results found for: COLORURINE  STUDIES: No results found. EXAM: DIGITAL DIAGNOSTIC BILATERAL MAMMOGRAM WITH 3D TOMOSYNTHESIS AND CAD  COMPARISON: [facility]  ACR Breast Density Category b: There are scattered areas of fibroglandular density.  FINDINGS: Postlumpectomy changes right breast. Within the lumpectomy site there are new faint calcifications which appear to be located within the periphery of an area of fat necrosis/oil cyst. No additional concerning masses, calcifications or architectural distortion identified within either breast.  Mammographic images were processed with CAD.  IMPRESSION: Calcifications at the lumpectomy site are favored to represent early fat necrosis.  No mammographic evidence for malignancy.  RECOMMENDATION: Right breast diagnostic mammography in 6 months with magnification views of the lumpectomy site to further evaluate evolution of probable fat necrosis.  I have discussed the findings and recommendations with the patient. Results were also provided in writing at the conclusion of the visit. If applicable, a reminder letter will be sent to the patient regarding the next appointment.  BI-RADS CATEGORY 3: Probably benign.   Electronically Signed  By: Lovey Newcomer M.D.  On: 08/20/2014 09:29  ASSESSMENT: 74 y.o. BRCA negative [place] woman   (1) status post  right lumpectomy and sentinel lymph node dissection 07/05/2011 for a pT1c pN0, stage IA invasive ductal carcinoma, grade 3, estrogen receptor and progesterone receptor both 100% positive, HER-2 not amplified, with an MIB-1 of 50%.  (2) Oncotype DX score of 9 predicts a risk of outside the breast recurrence within 10 years of 6% if the patient's only systemic treatment is tamoxifen for 5 years. It also protects no benefit from chemotherapy   (3) the patient completed adjuvant radiation 10/05/2011  (4) started anastrozole August 2013  (5) DEXA scan at [facility] gynecology 02/03/2014 showed osteoporosis  (6) genetics testing through the [facility] gene panel sent 03/10/2014 found no deleterious mutation in ATM, BARD1, BRCA1, BRCA2, BRIP1, CDH1, CHEK2, EPCAM, MLH1, MRE11A, MSH2, MSH6, MUTYH, NBN, NF1, PALB2, PMS2, PTEN, RAD50, RAD51C, RAD51D, SMARCA4, STK11, and TP53.  PLAN: Joy Gonzales is doing well as far as her breast cancer is concerned. She is now 3.5 years out from her definitive surgery with no evidence of recurrent disease. She had a mammogram in June that showed fat necrosis to the right breast, that will be reevaluated in December. She is tolerating the tamoxifen well and will continue this drug through at least August 2018 to complete 5 years total of antiestrogen therapy.   Joy Gonzales will return in July 2017 for labs and a follow up visit with Dr. Jana Hakim and continue yearly visits thereafter. She understands and agrees with  this plan. She knows the goal of treatment in her case is cure. She has been encouraged to call with any issues that might arise before her next visit here.  Laurie Panda, NP   12/17/2014 10:30 AM

## 2014-12-24 DIAGNOSIS — Z1389 Encounter for screening for other disorder: Secondary | ICD-10-CM | POA: Diagnosis not present

## 2014-12-24 DIAGNOSIS — E785 Hyperlipidemia, unspecified: Secondary | ICD-10-CM | POA: Diagnosis not present

## 2014-12-24 DIAGNOSIS — Z6834 Body mass index (BMI) 34.0-34.9, adult: Secondary | ICD-10-CM | POA: Diagnosis not present

## 2014-12-24 DIAGNOSIS — I10 Essential (primary) hypertension: Secondary | ICD-10-CM | POA: Diagnosis not present

## 2014-12-24 DIAGNOSIS — Z0000 Encounter for general adult medical examination without abnormal findings: Secondary | ICD-10-CM | POA: Diagnosis not present

## 2014-12-24 DIAGNOSIS — K219 Gastro-esophageal reflux disease without esophagitis: Secondary | ICD-10-CM | POA: Diagnosis not present

## 2014-12-24 DIAGNOSIS — C679 Malignant neoplasm of bladder, unspecified: Secondary | ICD-10-CM | POA: Diagnosis not present

## 2014-12-24 DIAGNOSIS — E669 Obesity, unspecified: Secondary | ICD-10-CM | POA: Diagnosis not present

## 2014-12-29 DIAGNOSIS — Z1212 Encounter for screening for malignant neoplasm of rectum: Secondary | ICD-10-CM | POA: Diagnosis not present

## 2015-01-12 ENCOUNTER — Other Ambulatory Visit: Payer: Self-pay | Admitting: Oncology

## 2015-01-12 DIAGNOSIS — Z9889 Other specified postprocedural states: Secondary | ICD-10-CM

## 2015-02-23 ENCOUNTER — Ambulatory Visit
Admission: RE | Admit: 2015-02-23 | Discharge: 2015-02-23 | Disposition: A | Discharge status: Home / Self Care | Payer: Medicare Other | Source: Ambulatory Visit | Attending: Oncology | Admitting: Oncology

## 2015-02-23 DIAGNOSIS — R921 Mammographic calcification found on diagnostic imaging of breast: Secondary | ICD-10-CM | POA: Diagnosis not present

## 2015-02-23 DIAGNOSIS — Z9889 Other specified postprocedural states: Secondary | ICD-10-CM

## 2015-03-01 ENCOUNTER — Other Ambulatory Visit: Payer: Self-pay | Admitting: Oncology

## 2015-03-01 DIAGNOSIS — R921 Mammographic calcification found on diagnostic imaging of breast: Secondary | ICD-10-CM

## 2015-03-26 DIAGNOSIS — D1771 Benign lipomatous neoplasm of kidney: Secondary | ICD-10-CM | POA: Diagnosis not present

## 2015-03-26 DIAGNOSIS — Z0000 Encounter for general adult medical examination without abnormal findings: Secondary | ICD-10-CM | POA: Diagnosis not present

## 2015-03-26 DIAGNOSIS — N393 Stress incontinence (female) (male): Secondary | ICD-10-CM | POA: Diagnosis not present

## 2015-03-26 DIAGNOSIS — Z8551 Personal history of malignant neoplasm of bladder: Secondary | ICD-10-CM | POA: Diagnosis not present

## 2015-03-26 DIAGNOSIS — N3281 Overactive bladder: Secondary | ICD-10-CM | POA: Diagnosis not present

## 2015-04-13 ENCOUNTER — Ambulatory Visit (INDEPENDENT_AMBULATORY_CARE_PROVIDER_SITE_OTHER): Payer: Medicare Other | Admitting: Women's Health

## 2015-04-13 ENCOUNTER — Encounter: Payer: Self-pay | Admitting: Women's Health

## 2015-04-13 VITALS — BP 128/80 | Ht 62.0 in | Wt 188.0 lb

## 2015-04-13 DIAGNOSIS — R21 Rash and other nonspecific skin eruption: Secondary | ICD-10-CM

## 2015-04-13 DIAGNOSIS — B373 Candidiasis of vulva and vagina: Secondary | ICD-10-CM

## 2015-04-13 DIAGNOSIS — B3731 Acute candidiasis of vulva and vagina: Secondary | ICD-10-CM

## 2015-04-13 LAB — WET PREP FOR TRICH, YEAST, CLUE
Clue Cells Wet Prep HPF POC: NONE SEEN
TRICH WET PREP: NONE SEEN

## 2015-04-13 MED ORDER — FLUCONAZOLE 150 MG PO TABS
ORAL_TABLET | ORAL | Status: DC
Start: 2015-04-13 — End: 2015-09-23

## 2015-04-13 MED ORDER — BETAMETHASONE DIPROPIONATE 0.05 % EX CREA: TOPICAL_CREAM | Freq: Two times a day (BID) | CUTANEOUS | Status: DC

## 2015-04-13 NOTE — Patient Instructions (Signed)

## 2015-04-13 NOTE — Progress Notes (Signed)
Patient ID: Joy Gonzales, female   DOB: 1941/02/08, 75 y.o.   MRN: [id number] Presents with complaint of rash in groin that has gotten slightly better. History of lichen sclerosus uses Temovate. Denies vaginal discharge or itching. Postmenopausal with no bleeding. 2013 right breast cancer on tamoxifen. Hypertension and hypercholesterolemia managed by primary care. Denies urinary symptoms other than frequency denies pain or burning.  Exam: Appears well. Both right and left groin erythematous. External genitalia erythematous, lichen sclerosis at clitoral hood, wet prep taken with Q-tip, positive for moderate yeast  Yeast vaginitis Groin rash 2013 right breast cancer on tamoxifen  Plan: Diflucan 150 by mouth times one dose with refill. Diprolene twice daily small amount to rash in the groin, loose clothing. Instructed to keep open to air as able, dry use a hair dryer after showering on cool.

## 2015-05-28 ENCOUNTER — Other Ambulatory Visit: Payer: Self-pay

## 2015-05-28 DIAGNOSIS — B009 Herpesviral infection, unspecified: Secondary | ICD-10-CM

## 2015-05-28 MED ORDER — VALACYCLOVIR HCL 500 MG PO TABS
500.0000 mg | ORAL_TABLET | Freq: Two times a day (BID) | ORAL | Status: DC
Start: 2015-05-28 — End: 2016-08-17

## 2015-06-10 DIAGNOSIS — C50911 Malignant neoplasm of unspecified site of right female breast: Secondary | ICD-10-CM | POA: Diagnosis not present

## 2015-09-07 ENCOUNTER — Ambulatory Visit
Admission: RE | Admit: 2015-09-07 | Discharge: 2015-09-07 | Disposition: A | Discharge status: Home / Self Care | Payer: Medicare Other | Source: Ambulatory Visit | Attending: Cardiology | Admitting: Cardiology

## 2015-09-07 ENCOUNTER — Other Ambulatory Visit: Payer: Self-pay | Admitting: Cardiology

## 2015-09-07 ENCOUNTER — Ambulatory Visit
Admission: RE | Admit: 2015-09-07 | Discharge: 2015-09-07 | Disposition: A | Discharge status: Home / Self Care | Payer: Medicare Other | Source: Ambulatory Visit | Attending: Oncology | Admitting: Oncology

## 2015-09-07 DIAGNOSIS — R921 Mammographic calcification found on diagnostic imaging of breast: Secondary | ICD-10-CM | POA: Diagnosis not present

## 2015-09-07 DIAGNOSIS — R0600 Dyspnea, unspecified: Secondary | ICD-10-CM

## 2015-09-22 ENCOUNTER — Other Ambulatory Visit: Payer: Self-pay | Admitting: *Deleted

## 2015-09-22 DIAGNOSIS — C50411 Malignant neoplasm of upper-outer quadrant of right female breast: Secondary | ICD-10-CM

## 2015-09-23 ENCOUNTER — Ambulatory Visit (HOSPITAL_BASED_OUTPATIENT_CLINIC_OR_DEPARTMENT_OTHER): Payer: Medicare Other | Admitting: Oncology

## 2015-09-23 ENCOUNTER — Telehealth: Payer: Self-pay | Admitting: Oncology

## 2015-09-23 ENCOUNTER — Other Ambulatory Visit (HOSPITAL_BASED_OUTPATIENT_CLINIC_OR_DEPARTMENT_OTHER): Payer: Medicare Other

## 2015-09-23 VITALS — BP 108/56 | HR 62 | Temp 98.2°F | Resp 18 | Ht 62.0 in | Wt 186.5 lb

## 2015-09-23 DIAGNOSIS — C50811 Malignant neoplasm of overlapping sites of right female breast: Secondary | ICD-10-CM

## 2015-09-23 DIAGNOSIS — Z79810 Long term (current) use of selective estrogen receptor modulators (SERMs): Secondary | ICD-10-CM

## 2015-09-23 DIAGNOSIS — C50411 Malignant neoplasm of upper-outer quadrant of right female breast: Secondary | ICD-10-CM

## 2015-09-23 DIAGNOSIS — R05 Cough: Secondary | ICD-10-CM | POA: Diagnosis not present

## 2015-09-23 DIAGNOSIS — Z170 Estrogen receptor positive status [ER+]: Secondary | ICD-10-CM

## 2015-09-23 LAB — COMPREHENSIVE METABOLIC PANEL
ALBUMIN: 3.7 g/dL (ref 3.5–5.0)
ALK PHOS: 88 U/L (ref 40–150)
ALT: 24 U/L (ref 0–55)
AST: 24 U/L (ref 5–34)
Anion Gap: 10 mEq/L (ref 3–11)
BUN: 14.5 mg/dL (ref 7.0–26.0)
CO2: 23 mEq/L (ref 22–29)
Calcium: 9.1 mg/dL (ref 8.4–10.4)
Chloride: 108 mEq/L (ref 98–109)
Creatinine: 0.8 mg/dL (ref 0.6–1.1)
EGFR: 72 mL/min/{1.73_m2} — AB (ref >90)
GLUCOSE: 102 mg/dL (ref 70–140)
POTASSIUM: 4.1 meq/L (ref 3.5–5.1)
SODIUM: 141 meq/L (ref 136–145)
Total Bilirubin: 0.47 mg/dL (ref 0.20–1.20)
Total Protein: 6.4 g/dL (ref 6.4–8.3)

## 2015-09-23 LAB — CBC WITH DIFFERENTIAL/PLATELET
BASO%: 1.1 % (ref 0.0–2.0)
BASOS ABS: 0.1 10*3/uL (ref 0.0–0.1)
EOS ABS: 0.2 10*3/uL (ref 0.0–0.5)
EOS%: 2.7 % (ref 0.0–7.0)
HEMATOCRIT: 42.2 % (ref 34.8–46.6)
HEMOGLOBIN: 14.2 g/dL (ref 11.6–15.9)
LYMPH#: 1.9 10*3/uL (ref 0.9–3.3)
LYMPH%: 33.7 % (ref 14.0–49.7)
MCH: 31.3 pg (ref 25.1–34.0)
MCHC: 33.6 g/dL (ref 31.5–36.0)
MCV: 93.2 fL (ref 79.5–101.0)
MONO#: 0.5 10*3/uL (ref 0.1–0.9)
MONO%: 9.3 % (ref 0.0–14.0)
NEUT%: 53.2 % (ref 38.4–76.8)
NEUTROS ABS: 3 10*3/uL (ref 1.5–6.5)
PLATELETS: 151 10*3/uL (ref 145–400)
RBC: 4.53 10*6/uL (ref 3.70–5.45)
RDW: 12.8 % (ref 11.2–14.5)
WBC: 5.6 10*3/uL (ref 3.9–10.3)

## 2015-09-23 MED ORDER — TAMOXIFEN CITRATE 20 MG PO TABS: 20.0000 mg | ORAL_TABLET | Freq: Every day | ORAL | Status: DC

## 2015-09-23 NOTE — Progress Notes (Signed)
[place]  [phone] [phone]) [phone]     ID: LANETA GUERIN OB: 07-08-1940  MR#: [id number]  SHF#:[id number]  PCP: Donnajean Lopes, MD GYN:  Quillian Quince.taken SU: Excell Seltzer OTHER MD: Peyton Bottoms, Peter Martinique, Ottumwa Buccini  CHIEF COMPLAINT: estrogen receptor positive breast cancer.  CURRENT TREATMENT: tamoxifen  BREAST CANCER HISTORY: From Dr. Pearlie Oyster original intake note 06/28/2011:  "AKSHITHA CULMER is a 75 y.o. female who recently self palpated a lump in the 12:00 position of her right breast which prompted a mammogram. She noticed this around beginning of February. Mammography on 06/09/2011 demonstrated a mass corresponding to the area of self palpation. On that same day, an ultrasound demonstrated a 1.4 x 0.9 x 1.0 cm mass in the 12:00 position. The patient underwent core needle biopsy and 06/20/2011. The pathology is notable for that described above. There was lymphovascular space invasion identified in the biopsy specimen. She underwent MRI of her breasts bilaterally and 06/23/2011. This demonstrated a 1.6 x 1.5 x 1.6 cm lesion in the 12:00 position of the right breast. There is no abnormal enhancement seen in the opposite breast. There is no enlarged axillary or internal mammary adenopathy. This is a solitary finding.   The patient has a history of bladder cancer which has been in remission since resection in 1999. 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 The patient also had reports a history of vaginal spotting this year which was explored by [facility], workup negative for cancer."  The patient's subsequent history is as detailed below  INTERVAL HISTORY: Avri returns today for follow up of her estrogen receptor positive breast cancer. She continues on tamoxifen which she much prefers to the aromatase inhibitors. She doesn't have any vaginal dryness problems.  She does have some hot flashes but "it's the better exchange".  REVIEW OF SYSTEMS: Yeslin is doing "fine" overall. [facility] She has a little bit of a dry cough which has been persistent. She does have heartburn symptoms. She bruises easily. A detailed review of systems today was otherwise noncontributory  PAST MEDICAL HISTORY: Past Medical History  Diagnosis Date  . HTN (hypertension)   . GERD (gastroesophageal reflux disease)   . Hyperlipemia   . Elevated LFTs   . Tinnitus   . Diverticulosis   . Vocal cord granuloma   . IBS (irritable bowel syndrome)   . Rectocele   . Lichen sclerosus   . Atrophic vaginitis   . HSV-1 infection   . Osteopenia   . Flushing   . Dyspnea     on exertion  . PONV (postoperative nausea and vomiting)   . Bladder cancer ([place])   . Breast cancer ([place])   . S/P radiation therapy 08/24/11 - 10/05/11    Right Breast: 50 Gy/25 Fractions with boost of 10Gy/5 Fractions  . Use of anastrozole (Arimidex) 10/25/11    PAST SURGICAL HISTORY: Past Surgical History  Procedure Laterality Date  . Tonsillectomy and adenoidectomy    . Throat surgery      LARYN. GRANULOMA  POLYP  . Cholecystectomy    . Bladder tumor excision    . Breast tumor removed    . Hysteroscopy w/d&c  05/18/2011    Procedure: DILATATION AND CURETTAGE /HYSTEROSCOPY;  Surgeon: Bennetta Laos, MD;  Location: [facility];  Service: Gynecology;  Laterality: N/A;  or Tuesday, March 12  7:30am? or Thursday, March 14 7:30am? or Tuesday March 19 7:30am?  . Dilation and curettage of uterus    . Hysteroscopy    . Needle core biospy  06/20/2011    Right Breast, 12'Oclock - Invasive Mammary Carcinoma with Lymphovascular Invasion: ER/PR 100%, Ki67 50%  . 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  Coronary artery disease Father   . Hypertension Father   . Heart failure Father   . Esophageal cancer Father 49  . Barrett's esophagus Brother   . Hypertension Brother   . Kidney cancer Sister 11  . Hypertension Sister   . Diabetes Sister   . Hypertension Sister   . Stroke Sister   . Breast cancer Maternal Grandmother 27  . Breast cancer Maternal Aunt     Age 31's  . Stomach cancer Paternal Uncle   . Breast cancer Other 55    triple negative; negative genetic testing  . Lung cancer Other 59  . Lung cancer Paternal Uncle   . Breast cancer Cousin     2 paternal cousins dx and died in their 96s  . Brain cancer Cousin    the patient's mothers mother was diagnosed with breast cancer the age of 73, and a second aunts on the same side was diagnosed with breast cancer the age of 26. The patient's niece, [name], is valid here for triple negative breast cancer. She has been found to be BRCA negative. Zykia herself has not been tested as of yet  GYNECOLOGIC HISTORY:  Menarche age 45, first live birth age 52, the patient is K[place]her P2. She went through menopause in her late 8s and took hormone replacement for about 10 years, and then Estring for about 5 years.   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 Her husband Shanon Brow used to be in Press photographer for [employer]. He is retired as well. The patient's son is an intense or and runs his own company he are intact. Incidentally they tell me his wife is from [place] so those grandchildren are bilingual. The second son, Claiborne Billings, is disabled secondary to Crohn's disease. The patient has 4 grandchildren. She is a Tourist information centre manager    ADVANCED DIRECTIVES: in place   HEALTH MAINTENANCE: Social History  Substance Use Topics  . Smoking status: Never Smoker   . Smokeless tobacco: Never Used  . Alcohol Use: No     Comment: very rare     Colonoscopy:  PAP:  Bone density:  Lipid panel:  Allergies  Allergen Reactions  .  Adhesive [Tape] Rash  . Felodipine Er Palpitations    tachycardia    Current Outpatient Prescriptions  Medication Sig Dispense Refill  . ALPRAZolam (XANAX) 0.25 MG tablet Take 0.125 mg by mouth daily as needed. Take infrequently    . aspirin 81 MG tablet Take 81 mg by mouth daily.      . Cholecalciferol (VITAMIN D-3 PO) Take 1 tablet by mouth daily.    . clobetasol cream (TEMOVATE) 0.05 % Apply topically 2 (two) times daily. 45 g 1  . esomeprazole (NEXIUM) 40 MG capsule Take 40 mg by mouth daily.      . fenofibrate micronized (LOFIBRA) 200 MG capsule Take 200 mg by mouth daily.      Marland Kitchen  fexofenadine (ALLEGRA) 180 MG tablet Take 180 mg by mouth as needed.      . metoprolol succinate (TOPROL XL) 25 MG 24 hr tablet Take 25 mg by mouth daily.      . Multiple Vitamin (MULTI-VITAMIN DAILY PO) Take 1 tablet by mouth daily.    Marland Kitchen olmesartan-hydrochlorothiazide (BENICAR HCT) 20-12.5 MG per tablet Take 1 tablet by mouth daily.    . tamoxifen (NOLVADEX) 20 MG tablet Take 1 tablet (20 mg total) by mouth daily. 90 tablet 4  . valACYclovir (VALTREX) 500 MG tablet Take 1 tablet (500 mg total) by mouth 2 (two) times daily. When she has a fever blister only 30 tablet 1   No current facility-administered medications for this visit.    OBJECTIVE: Middle-aged white woman In no acute distress  Filed Vitals:   09/23/15 1156  BP: 108/56  Pulse: 62  Temp: 98.2 F (36.8 C)  Resp: 18     Body mass index is 34.1 kg/(m^2).    ECOG FS:0 - Asymptomatic  Sclerae unicteric, pupils round and equal Oropharynx clear and moist-- no thrush or other lesions No cervical or supraclavicular adenopathy Lungs no rales or rhonchi Heart regular rate and rhythm Abd soft, nontender, positive bowel sounds MSK no focal spinal tenderness, no upper extremity lymphedema Neuro: nonfocal, well oriented, appropriate affect Breasts: The right breast is status post lumpectomy followed by radiation. There is no evidence of local  recurrence. The right axilla is benign. The left breast is unremarkable    LAB RESULTS:  CMP     Component Value Date/Time   NA 141 09/23/2015 1132   NA 143 08/21/2011 1544   K 4.1 09/23/2015 1132   K 3.5 08/21/2011 1544   CL 108* 07/25/2012 1001   CL 105 08/21/2011 1544   CO2 23 09/23/2015 1132   CO2 28 08/21/2011 1544   GLUCOSE 102 09/23/2015 1132   GLUCOSE 98 07/25/2012 1001   GLUCOSE 93 08/21/2011 1544   BUN 14.5 09/23/2015 1132   BUN 15 08/21/2011 1544   CREATININE 0.8 09/23/2015 1132   CREATININE 0.78 08/21/2011 1544   CALCIUM 9.1 09/23/2015 1132   CALCIUM 9.2 02/19/2014 1107   PROT 6.4 09/23/2015 1132   PROT 6.7 08/21/2011 1544   ALBUMIN 3.7 09/23/2015 1132   ALBUMIN 3.9 08/21/2011 1544   AST 24 09/23/2015 1132   AST 32 08/21/2011 1544   ALT 24 09/23/2015 1132   ALT 45* 08/21/2011 1544   ALKPHOS 88 09/23/2015 1132   ALKPHOS 85 08/21/2011 1544   BILITOT 0.47 09/23/2015 1132   BILITOT 0.2* 08/21/2011 1544   GFRNONAA 82* 07/03/2011 1330   GFRAA >90 07/03/2011 1330    I No results found for: SPEP  Lab Results  Component Value Date   WBC 5.6 09/23/2015   NEUTROABS 3.0 09/23/2015   HGB 14.2 09/23/2015   HCT 42.2 09/23/2015   MCV 93.2 09/23/2015   PLT 151 09/23/2015      Chemistry      Component Value Date/Time   NA 141 09/23/2015 1132   NA 143 08/21/2011 1544   K 4.1 09/23/2015 1132   K 3.5 08/21/2011 1544   CL 108* 07/25/2012 1001   CL 105 08/21/2011 1544   CO2 23 09/23/2015 1132   CO2 28 08/21/2011 1544   BUN 14.5 09/23/2015 1132   BUN 15 08/21/2011 1544   CREATININE 0.8 09/23/2015 1132   CREATININE 0.78 08/21/2011 1544      Component Value Date/Time   CALCIUM  9.1 09/23/2015 1132   CALCIUM 9.2 02/19/2014 1107   ALKPHOS 88 09/23/2015 1132   ALKPHOS 85 08/21/2011 1544   AST 24 09/23/2015 1132   AST 32 08/21/2011 1544   ALT 24 09/23/2015 1132   ALT 45* 08/21/2011 1544   BILITOT 0.47 09/23/2015 1132   BILITOT 0.2* 08/21/2011 1544        Lab Results  Component Value Date   LABCA2 12 06/28/2011    No components found for: [id number]  No results for input(s): INR in the last 168 hours.  Urinalysis No results found for: COLORURINE  STUDIES: Mm Diag Breast Tomo Bilateral  09/07/2015  CLINICAL DATA:  Patient has a history of a previous malignant lumpectomy of the right breast in 2013 with the radiation therapy. The patient also underwent a benign concordant right breast stereotactic biopsy in 2015. Follow-up evaluation of probably benign calcifications within the right breast and annual re-evaluation of the left breast. EXAM: 2D DIGITAL DIAGNOSTIC BILATERAL MAMMOGRAM WITH CAD AND ADJUNCT TOMO COMPARISON:  Previous examinations. ACR Breast Density Category b: There are scattered areas of fibroglandular density. FINDINGS: There are stable scarring changes within the central right breast related to the patient's previous lumpectomy. There are circumlinear calcifications seen within the lumpectomy bed on today's study consistent with benign fat necrosis. The left breast parenchymal pattern is stable. There are no findings worrisome for recurrent tumor or developing malignancy within either breast. Mammographic images were processed with CAD. IMPRESSION: Benign-appearing calcifications within the right lumpectomy bed consistent with evolving fat necrosis. No findings worrisome for recurrent tumor or developing malignancy. RECOMMENDATION: Bilateral diagnostic mammography in 1 year. I have discussed the findings and recommendations with the patient. Results were also provided in writing at the conclusion of the visit. If applicable, a reminder letter will be sent to the patient regarding the next appointment. BI-RADS CATEGORY  2: Benign. Electronically Signed   By: Altamese Cabal M.D.   On: 09/07/2015 10:44    ASSESSMENT: 75 y.o. BRCA negative [place] woman   (1) status post right breast upper outer quadrant lumpectomy and  sentinel lymph node dissection 07/05/2011 for a pT1c pN0, stage IA invasive ductal carcinoma, grade 3, estrogen receptor and progesterone receptor both 100% positive, HER-2 not amplified, with an MIB-1 of 50%.  (2) Oncotype DX score of 9 predicts a risk of outside the breast recurrence within 10 years of 6% if the patient's only systemic treatment is tamoxifen for 5 years. It also protects no benefit from chemotherapy   (3) the patient completed adjuvant radiation 10/05/2011  (4) started anastrozole August 2013, switched to tamoxifen October 2016  (5) DEXA scan at [facility] gynecology 02/03/2014 showed osteoporosis  (6) genetics testing through the [place] gene panel sent 03/10/2014 found no deleterious mutation in ATM, BARD1, BRCA1, BRCA2, BRIP1, CDH1, CHEK2, EPCAM, MLH1, MRE11A, MSH2, MSH6, MUTYH, NBN, NF1, PALB2, PMS2, PTEN, RAD50, RAD51C, RAD51D, SMARCA4, STK11, and TP53.  PLAN: Alyric is now a little over 4 years out from definitive surgery for her breast cancer, with no evidence of disease recurrence. This is very favorable.  She continues on anti-estrogens, now tamoxifen, which she is tolerating much better than the aromatase inhibitors.  I think her cough is likely going to be due to reflux and I suggested she up her Nexium dose (go to twice a day).  The plan is to continue tamoxifen to complete 5 years of anti-estrogens. At that point, which will be the next visit here, she will be ready to "graduate"  from oncologic follow-up.  She has a good understanding of this plan. She knows to call for any problems that may develop before her next visit here.  Chauncey Cruel, MD   09/23/2015 6:06 PM

## 2015-09-23 NOTE — Telephone Encounter (Signed)
appt made and avs printed °

## 2015-12-09 DIAGNOSIS — C50911 Malignant neoplasm of unspecified site of right female breast: Secondary | ICD-10-CM | POA: Diagnosis not present

## 2015-12-14 ENCOUNTER — Ambulatory Visit (INDEPENDENT_AMBULATORY_CARE_PROVIDER_SITE_OTHER): Payer: Medicare Other | Admitting: Women's Health

## 2015-12-14 ENCOUNTER — Encounter: Payer: Self-pay | Admitting: Women's Health

## 2015-12-14 VITALS — Ht 62.0 in | Wt 182.0 lb

## 2015-12-14 DIAGNOSIS — M8580 Other specified disorders of bone density and structure, unspecified site: Secondary | ICD-10-CM

## 2015-12-14 DIAGNOSIS — Z01419 Encounter for gynecological examination (general) (routine) without abnormal findings: Secondary | ICD-10-CM

## 2015-12-14 DIAGNOSIS — N952 Postmenopausal atrophic vaginitis: Secondary | ICD-10-CM

## 2015-12-14 DIAGNOSIS — Z780 Asymptomatic menopausal state: Secondary | ICD-10-CM | POA: Diagnosis not present

## 2015-12-14 NOTE — Progress Notes (Signed)
Joy Gonzales 10/02/1940 [id number]    History:    Presents for breast and pelvic exam. Postmenopausal/no HRT/no bleeding. 2013 right breast cancer lumpectomy, radiation currently on tamoxifen per. Dr. Jana Hakim. 2010 bladder cancer has had negative cystoscopies after. 2015 T score -2 at spine, -1.8 at hip, FRAX 10%/1.9%. Normal Pap history. Hypercholesteremia, hypertension, IBS with diarrhea, GERD primary care manages. 2010 colonoscopy benign colon polyps follow-up 10 years. Not sexually active/husbands health.  Past medical history, past surgical history, family history and social history were all reviewed and documented in the EPIC chart. Recently downsized to a town house very pleased with move. Husband is having difficulty with mobility. Has 2 sons  and  grandchildren live local.  ROS:  A ROS was performed and pertinent positives and negatives are included.  Exam:  Vitals:   12/14/15 1032  Weight: 182 lb (82.6 kg)  Height: 5\' 2"  (1.575 m)   Body mass index is 33.29 kg/m.   General appearance:  Normal Thyroid:  Symmetrical, normal in size, without palpable masses or nodularity. Respiratory  Auscultation:  Clear without wheezing or rhonchi Cardiovascular  Auscultation:  Regular rate, without rubs, murmurs or gallops  Edema/varicosities:  Not grossly evident Abdominal  Soft,nontender, without masses, guarding or rebound.  Liver/spleen:  No organomegaly noted  Hernia:  None appreciated  Skin  Inspection:  Grossly normal   Breasts: Examined lying and sitting.     Right: Well-healed lumpectomy incision, no discharge or axillary adenopathy.     Left: Without masses, retractions, discharge or axillary adenopathy. Gentitourinary   Inguinal/mons:  Normal without inguinal adenopathy  External genitalia:  Normal  BUS/Urethra/Skene's glands:  Normal  Vagina: Atrophic/asymptomatic  Cervix:  Normal  Uterus:  normal in size, shape and contour.  Midline and mobile  Adnexa/parametria:      Rt: Without masses or tenderness.   Lt: Without masses or tenderness.  Anus and perineum: Normal  Digital rectal exam: Normal sphincter tone without palpated masses or tenderness  Assessment/Plan:  75 y.o. MWF G2 P2 for breast and pelvic exam with no complaints.  Postmenopausal/no HRT/no bleeding 2013 right breast cancer lumpectomy and radiation on tamoxifen per Dr. Jana Hakim Osteopenia without elevated FRAX 2010 bladder cancer-urologist manages Hypercholesterolemia/hypertension managed by primary care IBS and GERD managed by GI  Plan: Zostavax recommended . Has had Pneumovax. SBE's, continue annual mammogram and follow-up as recommended. Encourage daily exercise walking, weights, home safety, fall prevention and importance of weightbearing exercise reviewed. Repeat DEXA after November. Encouraged to decrease calories/carbs for weight loss. Pap screening guidelines reviewed.    Huel Cote WHNP, 1:50 PM 12/14/2015

## 2015-12-14 NOTE — Patient Instructions (Signed)

## 2015-12-28 DIAGNOSIS — I10 Essential (primary) hypertension: Secondary | ICD-10-CM | POA: Diagnosis not present

## 2015-12-28 DIAGNOSIS — N390 Urinary tract infection, site not specified: Secondary | ICD-10-CM | POA: Diagnosis not present

## 2015-12-28 DIAGNOSIS — R8299 Other abnormal findings in urine: Secondary | ICD-10-CM | POA: Diagnosis not present

## 2015-12-28 DIAGNOSIS — E784 Other hyperlipidemia: Secondary | ICD-10-CM | POA: Diagnosis not present

## 2016-01-04 DIAGNOSIS — I10 Essential (primary) hypertension: Secondary | ICD-10-CM | POA: Diagnosis not present

## 2016-01-04 DIAGNOSIS — K219 Gastro-esophageal reflux disease without esophagitis: Secondary | ICD-10-CM | POA: Diagnosis not present

## 2016-01-04 DIAGNOSIS — E668 Other obesity: Secondary | ICD-10-CM | POA: Diagnosis not present

## 2016-01-04 DIAGNOSIS — Z0000 Encounter for general adult medical examination without abnormal findings: Secondary | ICD-10-CM | POA: Diagnosis not present

## 2016-01-04 DIAGNOSIS — Z6834 Body mass index (BMI) 34.0-34.9, adult: Secondary | ICD-10-CM | POA: Diagnosis not present

## 2016-01-04 DIAGNOSIS — Z1389 Encounter for screening for other disorder: Secondary | ICD-10-CM | POA: Diagnosis not present

## 2016-01-04 DIAGNOSIS — C679 Malignant neoplasm of bladder, unspecified: Secondary | ICD-10-CM | POA: Diagnosis not present

## 2016-01-04 DIAGNOSIS — E784 Other hyperlipidemia: Secondary | ICD-10-CM | POA: Diagnosis not present

## 2016-01-26 DIAGNOSIS — L853 Xerosis cutis: Secondary | ICD-10-CM | POA: Diagnosis not present

## 2016-01-26 DIAGNOSIS — D1801 Hemangioma of skin and subcutaneous tissue: Secondary | ICD-10-CM | POA: Diagnosis not present

## 2016-01-26 DIAGNOSIS — D692 Other nonthrombocytopenic purpura: Secondary | ICD-10-CM | POA: Diagnosis not present

## 2016-01-26 DIAGNOSIS — L821 Other seborrheic keratosis: Secondary | ICD-10-CM | POA: Diagnosis not present

## 2016-01-26 DIAGNOSIS — Z85828 Personal history of other malignant neoplasm of skin: Secondary | ICD-10-CM | POA: Diagnosis not present

## 2016-01-26 DIAGNOSIS — L738 Other specified follicular disorders: Secondary | ICD-10-CM | POA: Diagnosis not present

## 2016-03-13 DIAGNOSIS — M810 Age-related osteoporosis without current pathological fracture: Secondary | ICD-10-CM

## 2016-03-13 HISTORY — DX: Age-related osteoporosis without current pathological fracture: M81.0

## 2016-03-14 ENCOUNTER — Other Ambulatory Visit: Payer: Self-pay | Admitting: Gynecology

## 2016-03-14 DIAGNOSIS — M8580 Other specified disorders of bone density and structure, unspecified site: Secondary | ICD-10-CM

## 2016-03-14 DIAGNOSIS — Z780 Asymptomatic menopausal state: Secondary | ICD-10-CM

## 2016-03-28 ENCOUNTER — Encounter: Payer: Self-pay | Admitting: Gynecology

## 2016-03-28 ENCOUNTER — Ambulatory Visit (INDEPENDENT_AMBULATORY_CARE_PROVIDER_SITE_OTHER): Payer: Medicare Other

## 2016-03-28 ENCOUNTER — Other Ambulatory Visit: Payer: Self-pay | Admitting: Gynecology

## 2016-03-28 DIAGNOSIS — M818 Other osteoporosis without current pathological fracture: Secondary | ICD-10-CM

## 2016-03-28 DIAGNOSIS — M899 Disorder of bone, unspecified: Secondary | ICD-10-CM

## 2016-03-28 DIAGNOSIS — Z780 Asymptomatic menopausal state: Secondary | ICD-10-CM

## 2016-03-28 DIAGNOSIS — M810 Age-related osteoporosis without current pathological fracture: Secondary | ICD-10-CM | POA: Diagnosis not present

## 2016-03-28 DIAGNOSIS — M8580 Other specified disorders of bone density and structure, unspecified site: Secondary | ICD-10-CM

## 2016-03-30 ENCOUNTER — Telehealth: Payer: Self-pay | Admitting: Women's Health

## 2016-03-30 ENCOUNTER — Other Ambulatory Visit: Payer: Self-pay | Admitting: Women's Health

## 2016-03-30 DIAGNOSIS — M810 Age-related osteoporosis without current pathological fracture: Secondary | ICD-10-CM

## 2016-03-30 NOTE — Telephone Encounter (Signed)
Telephone call to review DEXA, reviewed increased bone loss at spine osteoporotic range, history of breast cancer with radiation. T score -3.9, hip average -2. Instructed to schedule lab appointment for PTH and calcium, normal vitamin D level in the past year. Schedule appointment 1 week later with Dr. Toney Rakes to discuss possible treatment.

## 2016-03-30 NOTE — Progress Notes (Signed)
Telephone call, DEXA reviewed, AP spine -3.9 osteoporotic, hip average -2 no significant loss at hip but significant loss at spine will check a PTH and calcium. Has had a vitamin D within the past year /normal range. Instructed to schedule a lab appointment, and one week later appointment with Dr. Toney Rakes to discuss

## 2016-04-03 ENCOUNTER — Other Ambulatory Visit: Payer: Self-pay | Admitting: Gynecology

## 2016-04-03 DIAGNOSIS — M818 Other osteoporosis without current pathological fracture: Principal | ICD-10-CM

## 2016-04-03 DIAGNOSIS — M810 Age-related osteoporosis without current pathological fracture: Secondary | ICD-10-CM

## 2016-04-18 ENCOUNTER — Ambulatory Visit (INDEPENDENT_AMBULATORY_CARE_PROVIDER_SITE_OTHER): Payer: Medicare Other | Admitting: Gynecology

## 2016-04-18 ENCOUNTER — Encounter: Payer: Self-pay | Admitting: Gynecology

## 2016-04-18 ENCOUNTER — Telehealth: Payer: Self-pay | Admitting: Gynecology

## 2016-04-18 VITALS — BP 110/70 | Ht 62.0 in | Wt 182.0 lb

## 2016-04-18 DIAGNOSIS — M810 Age-related osteoporosis without current pathological fracture: Secondary | ICD-10-CM | POA: Insufficient documentation

## 2016-04-18 NOTE — Patient Instructions (Addendum)
Zoledronic Acid injection (Paget's Disease, Osteoporosis) °What is this medicine? °ZOLEDRONIC ACID (ZOE le dron ik AS id) lowers the amount of calcium loss from bone. It is used to treat Paget's disease and osteoporosis in women. °COMMON BRAND NAME(S): Reclast, Zometa °What should I tell my health care provider before I take this medicine? °They need to know if you have any of these conditions: °-aspirin-sensitive asthma °-cancer, especially if you are receiving medicines used to treat cancer °-dental disease or wear dentures °-infection °-kidney disease °-low levels of calcium in the blood °-past surgery on the parathyroid gland or intestines °-receiving corticosteroids like dexamethasone or prednisone °-an unusual or allergic reaction to zoledronic acid, other medicines, foods, dyes, or preservatives °-pregnant or trying to get pregnant °-breast-feeding °How should I use this medicine? °This medicine is for infusion into a vein. It is given by a health care professional in a hospital or clinic setting. °Talk to your pediatrician regarding the use of this medicine in children. This medicine is not approved for use in children. °What if I miss a dose? °It is important not to miss your dose. Call your doctor or health care professional if you are unable to keep an appointment. °What may interact with this medicine? °-certain antibiotics given by injection °-NSAIDs, medicines for pain and inflammation, like ibuprofen or naproxen °-some diuretics like bumetanide, furosemide °-teriparatide °What should I watch for while using this medicine? °Visit your doctor or health care professional for regular checkups. It may be some time before you see the benefit from this medicine. Do not stop taking your medicine unless your doctor tells you to. Your doctor may order blood tests or other tests to see how you are doing. °Women should inform their doctor if they wish to become pregnant or think they might be pregnant. There is a  potential for serious side effects to an unborn child. Talk to your health care professional or pharmacist for more information. °You should make sure that you get enough calcium and vitamin D while you are taking this medicine. Discuss the foods you eat and the vitamins you take with your health care professional. °Some people who take this medicine have severe bone, joint, and/or muscle pain. This medicine may also increase your risk for jaw problems or a broken thigh bone. Tell your doctor right away if you have severe pain in your jaw, bones, joints, or muscles. Tell your doctor if you have any pain that does not go away or that gets worse. °Tell your dentist and dental surgeon that you are taking this medicine. You should not have major dental surgery while on this medicine. See your dentist to have a dental exam and fix any dental problems before starting this medicine. Take good care of your teeth while on this medicine. Make sure you see your dentist for regular follow-up appointments. °What side effects may I notice from receiving this medicine? °Side effects that you should report to your doctor or health care professional as soon as possible: °-allergic reactions like skin rash, itching or hives, swelling of the face, lips, or tongue °-anxiety, confusion, or depression °-breathing problems °-changes in vision °-eye pain °-feeling faint or lightheaded, falls °-jaw pain, especially after dental work °-mouth sores °-muscle cramps, stiffness, or weakness °-redness, blistering, peeling or loosening of the skin, including inside the mouth °-trouble passing urine or change in the amount of urine °Side effects that usually do not require medical attention (report to your doctor or health care professional if   they continue or are bothersome): -bone, joint, or muscle pain -constipation -diarrhea -fever -hair loss -irritation at site where injected -loss of appetite -nausea, vomiting -stomach  upset -trouble sleeping -trouble swallowing -weak or tired Where should I keep my medicine? This drug is given in a hospital or clinic and will not be stored at home.  2017 Elsevier/Gold Standard (2013-07-26 14:19:57) Denosumab injection What is this medicine? DENOSUMAB (den oh sue mab) slows bone breakdown. Prolia is used to treat osteoporosis in women after menopause and in men. Delton See is used to prevent bone fractures and other bone problems caused by cancer bone metastases. Delton See is also used to treat giant cell tumor of the bone. COMMON BRAND NAME(S): Prolia, XGEVA What should I tell my health care provider before I take this medicine? They need to know if you have any of these conditions: -dental disease -eczema -infection or history of infections -kidney disease or on dialysis -low blood calcium or vitamin D -malabsorption syndrome -scheduled to have surgery or tooth extraction -taking medicine that contains denosumab -thyroid or parathyroid disease -an unusual reaction to denosumab, other medicines, foods, dyes, or preservatives -pregnant or trying to get pregnant -breast-feeding How should I use this medicine? This medicine is for injection under the skin. It is given by a health care professional in a hospital or clinic setting. If you are getting Prolia, a special MedGuide will be given to you by the pharmacist with each prescription and refill. Be sure to read this information carefully each time. For Prolia, talk to your pediatrician regarding the use of this medicine in children. Special care may be needed. For Delton See, talk to your pediatrician regarding the use of this medicine in children. While this drug may be prescribed for children as young as 13 years for selected conditions, precautions do apply. What if I miss a dose? It is important not to miss your dose. Call your doctor or health care professional if you are unable to keep an appointment. What may interact with  this medicine? Do not take this medicine with any of the following medications: -other medicines containing denosumab This medicine may also interact with the following medications: -medicines that suppress the immune system -medicines that treat cancer -steroid medicines like prednisone or cortisone What should I watch for while using this medicine? Visit your doctor or health care professional for regular checks on your progress. Your doctor or health care professional may order blood tests and other tests to see how you are doing. Call your doctor or health care professional if you get a cold or other infection while receiving this medicine. Do not treat yourself. This medicine may decrease your body's ability to fight infection. You should make sure you get enough calcium and vitamin D while you are taking this medicine, unless your doctor tells you not to. Discuss the foods you eat and the vitamins you take with your health care professional. See your dentist regularly. Brush and floss your teeth as directed. Before you have any dental work done, tell your dentist you are receiving this medicine. Do not become pregnant while taking this medicine or for 5 months after stopping it. Women should inform their doctor if they wish to become pregnant or think they might be pregnant. There is a potential for serious side effects to an unborn child. Talk to your health care professional or pharmacist for more information. What side effects may I notice from receiving this medicine? Side effects that you should report to your  doctor or health care professional as soon as possible: -allergic reactions like skin rash, itching or hives, swelling of the face, lips, or tongue -breathing problems -chest pain -fast, irregular heartbeat -feeling faint or lightheaded, falls -fever, chills, or any other sign of infection -muscle spasms, tightening, or twitches -numbness or tingling -skin blisters or bumps,  or is dry, peels, or red -slow healing or unexplained pain in the mouth or jaw -unusual bleeding or bruising Side effects that usually do not require medical attention (report to your doctor or health care professional if they continue or are bothersome): -muscle pain -stomach upset, gas Where should I keep my medicine? This medicine is only given in a clinic, doctor's office, or other health care setting and will not be stored at home.  2017 Elsevier/Gold Standard (2015-04-01 10:06:55) Teriparatide injection What is this medicine? TERIPARATIDE (terr ih PAR a tyd) increases bone mass and strength. It helps make healthy bone and to slow bone loss. This medicine is used to prevent bone fractures. This medicine may be used for other purposes; ask your health care provider or pharmacist if you have questions. COMMON BRAND NAME(S): Forteo What should I tell my health care provider before I take this medicine? They need to know if you have any of these conditions: -bone disease other than osteoporosis -high levels of calcium in the blood -history of cancer in the bone -kidney stone -Paget's disease -parathyroid disease -receiving radiation therapy -an unusual or allergic reaction to teriparatide, other medicines, foods, dyes, or preservatives -pregnant or trying to get pregnant -breast-feeding How should I use this medicine? This medicine is for injection under the skin. You will be taught how to prepare and give this medicine. Use exactly as directed. Take your medicine at regular intervals. Do not take your medicine more often than directed. It is important that you put your used needles and pens in a special sharps container. Do not put them in a trash can. If you do not have a sharps container, call your pharmacist or health care provider to get one. A special MedGuide will be given to you by the pharmacist with each prescription and refill. Be sure to read this information carefully each  time. Talk to your pediatrician regarding the use of this medicine in children. Special care may be needed. Overdosage: If you think you have taken too much of this medicine contact a poison control center or emergency room at once. NOTE: This medicine is only for you. Do not share this medicine with others. What if I miss a dose? If you miss a dose, take it as soon as you can. If it is almost time for your next dose, take only that dose. Do not take double or extra doses. What may interact with this medicine? -digoxin This list may not describe all possible interactions. Give your health care provider a list of all the medicines, herbs, non-prescription drugs, or dietary supplements you use. Also tell them if you smoke, drink alcohol, or use illegal drugs. Some items may interact with your medicine. What should I watch for while using this medicine? Visit your doctor or health care professional for regular checks on your progress. Your doctor may order blood tests and other tests to see how you are doing. You should make sure you get enough calcium and vitamin D while you are taking this medicine, unless your doctor tells you not to. Discuss the foods you eat and the vitamins you take with your health care professional.  You may get drowsy or dizzy. Do not drive, use machinery, or do anything that needs mental alertness until you know how this medicine affects you. Do not stand or sit up quickly, especially if you are an older patient. This reduces the risk of dizzy or fainting spells. Talk to your doctor about your risk of cancer. You may be more at risk for certain types of cancers if you take this medicine. What side effects may I notice from receiving this medicine? Side effects that you should report to your doctor or health care professional as soon as possible: -allergic reactions like skin rash, itching or hives, swelling of the face, lips, or tongue -blood in the urine; pain in the lower  back or side; pain when urinating -signs and symptoms of low blood pressure like dizziness; feeling faint or lightheaded, falls; unusually weak or tired -signs and symptoms of increased calcium like nausea; vomiting; constipation; low energy; or muscle weakness Side effects that usually do not require medical attention (report these to your doctor or health care professional if they continue or are bothersome): -headache -joint pain -nausea -pain, redness, irritation or swelling at the injection site -stomach upset This list may not describe all possible side effects. Call your doctor for medical advice about side effects. You may report side effects to FDA at 1-800-FDA-1088. Where should I keep my medicine? Keep out of the reach of children. Store the pens in a refrigerator between 2 and 8 degrees C (36 and 46 degrees F). Do not freeze. Use the pen quickly after taking out of the refrigerator and recap and return to refrigerator right after using. Protect from light. Throw away any unused medicine 28 days after the first injection from the pen. Throw away any unused medicine after the expiration date on the label. NOTE: This sheet is a summary. It may not cover all possible information. If you have questions about this medicine, talk to your doctor, pharmacist, or health care provider.  2017 Elsevier/Gold Standard (2015-07-19 10:23:57)  Osteoporosis Osteoporosis is the thinning and loss of density in the bones. Osteoporosis makes the bones more brittle, fragile, and likely to break (fracture). Over time, osteoporosis can cause the bones to become so weak that they fracture after a simple fall. The bones most likely to fracture are the bones in the hip, wrist, and spine. What are the causes? The exact cause is not known. What increases the risk? Anyone can develop osteoporosis. You may be at greater risk if you have a family history of the condition or have poor nutrition. You may also have a  higher risk if you are:  Female.  62 years old or older.  A smoker.  Not physically active.  White or Asian.  Slender. What are the signs or symptoms? A fracture might be the first sign of the disease, especially if it results from a fall or injury that would not usually cause a bone to break. Other signs and symptoms include:  Low back and neck pain.  Stooped posture.  Height loss. How is this diagnosed? To make a diagnosis, your health care provider may:  Take a medical history.  Perform a physical exam.  Order tests, such as:  A bone mineral density test.  A dual-energy X-ray absorptiometry test. How is this treated? The goal of osteoporosis treatment is to strengthen your bones to reduce your risk of a fracture. Treatment may involve:  Making lifestyle changes, such as:  Eating a diet rich in  calcium.  Doing weight-bearing and muscle-strengthening exercises.  Stopping tobacco use.  Limiting alcohol intake.  Taking medicine to slow the process of bone loss or to increase bone density.  Monitoring your levels of calcium and vitamin D. Follow these instructions at home:  Include calcium and vitamin D in your diet. Calcium is important for bone health, and vitamin D helps the body absorb calcium.  Perform weight-bearing and muscle-strengthening exercises as directed by your health care provider.  Do not use any tobacco products, including cigarettes, chewing tobacco, and electronic cigarettes. If you need help quitting, ask your health care provider.  Limit your alcohol intake.  Take medicines only as directed by your health care provider.  Keep all follow-up visits as directed by your health care provider. This is important.  Take precautions at home to lower your risk of falling, such as:  Keeping rooms well lit and clutter free.  Installing safety rails on stairs.  Using rubber mats in the bathroom and other areas that are often wet or  slippery. Get help right away if: You fall or injure yourself. This information is not intended to replace advice given to you by your health care provider. Make sure you discuss any questions you have with your health care provider. Document Released: 12/07/2004 Document Revised: 08/02/2015 Document Reviewed: 08/07/2013 Elsevier Interactive Patient Education  2017 [employer].

## 2016-04-18 NOTE — Telephone Encounter (Signed)
Pam, this patient will be calling sometime this week after she decides which treatment options that I have provided for her for treatment of her osteoporosis. Patient with history of breast cancer has had esophageal disease and I had presented her with literature information on Forteo which is my first choice for 2 years since her AP spine T score was -3.9 and the other option were Prolia or Reclast. She is gaining a calcium, vitamin D and PTH level drawn today.

## 2016-04-18 NOTE — Progress Notes (Signed)
Patient is a 76 year old that presented to the office today to discuss her bone density study. Patient with history of breast cancer was 3 years on Arimidex and is now completed 2 years of tamoxifen. In 2013 she had a lumpectomy and radiation treatment for right breast cancer.  Her bone density study January 6 of this year demonstrated that her lowest T score was at the AP spine with a value of -3.9. There was a -17.5% decreased bone mineralization with compare with baseline in 2009. This is clear evidence that she is osteoporotic and her bone mass is 25% below normal and that she has an 8 times greater risk of a spinal fracture. Her calcium, vitamin D and PTH level be drawn today.  We had an extensive discussion on diagnoses and treatment of bone density study as well as the following treatment options:  The risk and benefits of Forteo were discussed with the patient today to include a significant risk reduction for vertebral and nontender vertebral fractures. The most common side effects that have been reported include: Dizziness, and leg cramps although generally not severe enough to warrant discontinuation in the majority of patient on this medication. The black box warning was discussed in depth, which is based on the fact that supra-pharmacological doses of Forteo did increase the risk for benign and malignant bone tumors in rats, though there has not been an observed increase incidence in humans to date, based on over a decade of clinical experience. Patient does not have any relative contraindication to Forteo, including no history of previous therapeutic radiation therapy, active neoplasms involving the skeleton or Paget's disease.  The risk and benefits of Prolia were discussed with the patient today to include a significant risk reduction for vertebral, hip and non-vertebral fractures. Potential risk also discussed were skin advanced to include rash, pruritus, eczema and other skin reactions.  The recurrence of infections and clinical studies with PROLIA, including serious infections were also discussed with the patient. In 3 year clinical trial with Prolia although six-year trial data does not show any evidence for any additional or accelerating risk of infection. Other risks that were discussed included a 1 in 1000-10,000 risk of osteonecrosis of the jaw based on current available data  The risk and benefits of IV bisphosphonate were discussed with the patient today to include a significant risk reduction for vertebral, hip and non-vertebral fractures. Potential risk of therapy were also discussed to include a 10-15% chance for a flulike reaction which may last 2-3 days after IV bisphosphonate. We also discussed that longer reactions lasting perhaps weeks to months have been rarely reported to the FDA following IV bisphosphonate treatment. This would also require symptomatic management. Other potential risk that were discussed included the following: Regular eye irritation, approximately 1 and 1000-10,000 risk for osteonecrosis of the jaw as well as a risk for atypical femoral fracture of approximately 1 in 5000-10,000 based on current data on oral bisphosphonate. Signs and symptoms of atypical femoral fracture were also discussed and the patient was asked to contact the office if she develops any of these symptoms.  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  Literature information was provided and all the above and she will read it and let us know so that we can coordinate one of the above treatment options. I've encouraged her to seriously consider for 2 years first the Forteo to help build bone and then transition  her into one of the other tube.  Greater than 90% of time was spent counseling and coordinating care for this patient with osteoporosis. Time of consultation 25 minutes.

## 2016-04-19 LAB — PTH, INTACT AND CALCIUM
CALCIUM: 9.3 mg/dL (ref 8.6–10.4)
PTH: 23 pg/mL (ref 14–64)

## 2016-04-19 LAB — VITAMIN D 25 HYDROXY (VIT D DEFICIENCY, FRACTURES): VIT D 25 HYDROXY: 29 ng/mL — AB (ref 30–100)

## 2016-04-20 ENCOUNTER — Encounter: Payer: Self-pay | Admitting: Gynecology

## 2016-04-20 ENCOUNTER — Other Ambulatory Visit: Payer: Self-pay | Admitting: Gynecology

## 2016-04-20 DIAGNOSIS — E559 Vitamin D deficiency, unspecified: Secondary | ICD-10-CM

## 2016-04-20 MED ORDER — VITAMIN D (ERGOCALCIFEROL) 1.25 MG (50000 UNIT) PO CAPS: 50000.0000 [IU] | ORAL_CAPSULE | ORAL | 0 refills | Status: DC

## 2016-05-11 NOTE — Telephone Encounter (Signed)
PC to patient difficult decisions regarding Forteo due to hx of breast cancer and possible side effect of bone cancer with Forteo. She will read about Prolia and Reclast and call back with her decision.

## 2016-05-15 DIAGNOSIS — N3281 Overactive bladder: Secondary | ICD-10-CM | POA: Diagnosis not present

## 2016-05-15 DIAGNOSIS — Z8551 Personal history of malignant neoplasm of bladder: Secondary | ICD-10-CM | POA: Diagnosis not present

## 2016-05-15 DIAGNOSIS — R8271 Bacteriuria: Secondary | ICD-10-CM | POA: Diagnosis not present

## 2016-05-15 DIAGNOSIS — N393 Stress incontinence (female) (male): Secondary | ICD-10-CM | POA: Diagnosis not present

## 2016-05-22 NOTE — Telephone Encounter (Signed)
PC from pt concern about Prolia and side effects due to hx of dental surgery. I suggested she talk to her dentist. She will call back.

## 2016-05-26 DIAGNOSIS — Z6834 Body mass index (BMI) 34.0-34.9, adult: Secondary | ICD-10-CM | POA: Diagnosis not present

## 2016-05-26 DIAGNOSIS — Z1389 Encounter for screening for other disorder: Secondary | ICD-10-CM | POA: Diagnosis not present

## 2016-05-26 DIAGNOSIS — M810 Age-related osteoporosis without current pathological fracture: Secondary | ICD-10-CM | POA: Diagnosis not present

## 2016-05-26 DIAGNOSIS — K625 Hemorrhage of anus and rectum: Secondary | ICD-10-CM | POA: Diagnosis not present

## 2016-06-05 NOTE — Telephone Encounter (Signed)
prolia is her choice I will check benefits.She had questions if her current meds will change and I told her no.

## 2016-06-12 NOTE — Telephone Encounter (Signed)
Complete exam [place]  12/2015 , Calcium  9.3 04/18/2016  . Prolia insurance benefits  Deductible met, secondary coverage  $0 cost for patient. PC to pt she thinks that she may have shingles and will verify , told her that we will wait on Prolia until after she is clear from virus. She understands and will call back to schedule injection with Nurse only.

## 2016-06-13 DIAGNOSIS — B029 Zoster without complications: Secondary | ICD-10-CM | POA: Diagnosis not present

## 2016-06-13 DIAGNOSIS — Z6834 Body mass index (BMI) 34.0-34.9, adult: Secondary | ICD-10-CM | POA: Diagnosis not present

## 2016-06-15 ENCOUNTER — Encounter: Payer: Self-pay | Admitting: Anesthesiology

## 2016-06-15 NOTE — Telephone Encounter (Signed)
Pt VM she has shingles and PCP said no Prolia for one month . She will call me to schedule.

## 2016-06-28 DIAGNOSIS — C50911 Malignant neoplasm of unspecified site of right female breast: Secondary | ICD-10-CM | POA: Diagnosis not present

## 2016-07-18 NOTE — Telephone Encounter (Signed)
Note to Dr Toney Rakes. PC from pt . She has decided to not proceed with Prolia at this time due to sudden declining health of husband.

## 2016-07-24 ENCOUNTER — Other Ambulatory Visit: Payer: Medicare Other

## 2016-07-24 DIAGNOSIS — E559 Vitamin D deficiency, unspecified: Secondary | ICD-10-CM | POA: Diagnosis not present

## 2016-07-25 ENCOUNTER — Other Ambulatory Visit: Payer: Self-pay | Admitting: Gynecology

## 2016-07-25 DIAGNOSIS — E559 Vitamin D deficiency, unspecified: Secondary | ICD-10-CM

## 2016-07-25 LAB — VITAMIN D 25 HYDROXY (VIT D DEFICIENCY, FRACTURES): Vit D, 25-Hydroxy: 29 ng/mL — ABNORMAL LOW (ref 30–100)

## 2016-07-25 MED ORDER — VITAMIN D (ERGOCALCIFEROL) 1.25 MG (50000 UNIT) PO CAPS: 50000.0000 [IU] | ORAL_CAPSULE | ORAL | 0 refills | Status: DC

## 2016-07-26 ENCOUNTER — Encounter: Payer: Self-pay | Admitting: Gynecology

## 2016-08-03 ENCOUNTER — Other Ambulatory Visit: Payer: Self-pay | Admitting: Oncology

## 2016-08-03 DIAGNOSIS — Z853 Personal history of malignant neoplasm of breast: Secondary | ICD-10-CM

## 2016-08-17 ENCOUNTER — Other Ambulatory Visit: Payer: Self-pay | Admitting: Women's Health

## 2016-08-17 DIAGNOSIS — B009 Herpesviral infection, unspecified: Secondary | ICD-10-CM

## 2016-09-14 ENCOUNTER — Other Ambulatory Visit (HOSPITAL_BASED_OUTPATIENT_CLINIC_OR_DEPARTMENT_OTHER): Payer: Medicare Other

## 2016-09-14 DIAGNOSIS — C50811 Malignant neoplasm of overlapping sites of right female breast: Secondary | ICD-10-CM

## 2016-09-14 DIAGNOSIS — C50411 Malignant neoplasm of upper-outer quadrant of right female breast: Secondary | ICD-10-CM

## 2016-09-14 LAB — COMPREHENSIVE METABOLIC PANEL
ALBUMIN: 3.7 g/dL (ref 3.5–5.0)
ALK PHOS: 79 U/L (ref 40–150)
ALT: 18 U/L (ref 0–55)
ANION GAP: 10 meq/L (ref 3–11)
AST: 17 U/L (ref 5–34)
BILIRUBIN TOTAL: 0.37 mg/dL (ref 0.20–1.20)
BUN: 14.9 mg/dL (ref 7.0–26.0)
CO2: 24 mEq/L (ref 22–29)
Calcium: 9.4 mg/dL (ref 8.4–10.4)
Chloride: 108 mEq/L (ref 98–109)
Creatinine: 0.7 mg/dL (ref 0.6–1.1)
EGFR: 82 mL/min/{1.73_m2} — AB (ref >90)
GLUCOSE: 95 mg/dL (ref 70–140)
Potassium: 4.1 mEq/L (ref 3.5–5.1)
Sodium: 142 mEq/L (ref 136–145)
TOTAL PROTEIN: 6.3 g/dL — AB (ref 6.4–8.3)

## 2016-09-14 LAB — CBC WITH DIFFERENTIAL/PLATELET
BASO%: 1.5 % (ref 0.0–2.0)
Basophils Absolute: 0.1 10*3/uL (ref 0.0–0.1)
EOS ABS: 0.3 10*3/uL (ref 0.0–0.5)
EOS%: 4.6 % (ref 0.0–7.0)
HEMATOCRIT: 41.6 % (ref 34.8–46.6)
HEMOGLOBIN: 13.8 g/dL (ref 11.6–15.9)
LYMPH#: 2 10*3/uL (ref 0.9–3.3)
LYMPH%: 37.2 % (ref 14.0–49.7)
MCH: 31.6 pg (ref 25.1–34.0)
MCHC: 33.2 g/dL (ref 31.5–36.0)
MCV: 95.2 fL (ref 79.5–101.0)
MONO#: 0.4 10*3/uL (ref 0.1–0.9)
MONO%: 7.7 % (ref 0.0–14.0)
NEUT%: 49 % (ref 38.4–76.8)
NEUTROS ABS: 2.7 10*3/uL (ref 1.5–6.5)
Platelets: 145 10*3/uL (ref 145–400)
RBC: 4.37 10*6/uL (ref 3.70–5.45)
RDW: 12.8 % (ref 11.2–14.5)
WBC: 5.5 10*3/uL (ref 3.9–10.3)

## 2016-09-21 ENCOUNTER — Ambulatory Visit (HOSPITAL_BASED_OUTPATIENT_CLINIC_OR_DEPARTMENT_OTHER): Payer: Medicare Other | Admitting: Oncology

## 2016-09-21 VITALS — BP 139/57 | HR 61 | Temp 98.4°F | Resp 18 | Ht 62.0 in | Wt 187.9 lb

## 2016-09-21 DIAGNOSIS — Z79810 Long term (current) use of selective estrogen receptor modulators (SERMs): Secondary | ICD-10-CM | POA: Diagnosis not present

## 2016-09-21 DIAGNOSIS — Z8551 Personal history of malignant neoplasm of bladder: Secondary | ICD-10-CM | POA: Diagnosis not present

## 2016-09-21 DIAGNOSIS — C50411 Malignant neoplasm of upper-outer quadrant of right female breast: Secondary | ICD-10-CM

## 2016-09-21 DIAGNOSIS — Z170 Estrogen receptor positive status [ER+]: Secondary | ICD-10-CM

## 2016-09-21 DIAGNOSIS — C50811 Malignant neoplasm of overlapping sites of right female breast: Secondary | ICD-10-CM | POA: Diagnosis not present

## 2016-09-21 NOTE — Progress Notes (Signed)
[place]  [phone] Fax:([phone]     ID: [name] OB: 1941-02-18  MR#: [id number]  MIW#:[id number]  PCP: Joy Battles, MD GYN:  Joy Gonzales.taken SU: Joy Gonzales OTHER MD: Joy Gonzales, Joy Gonzales, Joy Gonzales  CHIEF COMPLAINT: estrogen receptor positive breast cancer.  CURRENT TREATMENT: tamoxifen  BREAST CANCER HISTORY: From Dr. Pearlie Gonzales original intake note 06/28/2011:  "Joy Gonzales is a 76 y.o. female who recently self palpated a lump in the 12:00 position of her right breast which prompted a mammogram. She noticed this around beginning of February. Mammography on 06/09/2011 demonstrated a mass corresponding to the area of self palpation. On that same day, an ultrasound demonstrated a 1.4 x 0.9 x 1.0 cm mass in the 12:00 position. The patient underwent core needle biopsy and 06/20/2011. The pathology is notable for that described above. There was lymphovascular space invasion identified in the biopsy specimen. She underwent MRI of her breasts bilaterally and 06/23/2011. This demonstrated a 1.6 x 1.5 x 1.6 cm lesion in the 12:00 position of the right breast. There is no abnormal enhancement seen in the opposite breast. There is no enlarged axillary or internal mammary adenopathy. This is a solitary finding.   The patient has a history of bladder cancer which has been in remission since resection in 1999. She has multiple granulomas of the vocal cord that have been treated by multiple surgeries in [place] but no evidence of malignancy. The patient also had reports a history of vaginal spotting this year which was explored by Joy Gonzales, workup negative for cancer."  The patient's subsequent history is as detailed below  INTERVAL HISTORY: Joy Gonzales returns today for follow-up and treatment of her estrogen receptor positive breast cancer. She is completing 5 years of tamoxifen. She tolerates that well. Hot flashes are not a major issue. She does feel  "hot all the time" the She obtains it under very good price.  REVIEW OF SYSTEMS: Joy Gonzales is a little bit of a sore throat. She status post multiple surgeries for vocal cord polyps. She also has seasonal allergies. She has been found to have osteoporosis and is under intensive vitamin D supplementation at present. She continues to have some reflux issues. Otherwise a detailed review of systems today was stable  PAST MEDICAL HISTORY: Past Medical History:  Diagnosis Date  . Atrophic vaginitis   . Bladder cancer ([place])   . Breast cancer ([place])   . Diverticulosis   . Dyspnea    on exertion  . Elevated LFTs   . Flushing   . GERD (gastroesophageal reflux disease)   . HSV-1 infection   . HTN (hypertension)   . Hyperlipemia   . IBS (irritable bowel syndrome)   . Lichen sclerosus   . Osteopenia   . Osteoporosis 2018  . PONV (postoperative nausea and vomiting)   . Rectocele   . S/P radiation therapy 08/24/11 - 10/05/11   Right Breast: 50 Gy/25 Fractions with boost of 10Gy/5 Fractions  . Tinnitus   . Use of anastrozole (Arimidex) 10/25/11  . Vitamin D deficiency   . Vocal cord granuloma     PAST SURGICAL HISTORY: Past Surgical History:  Procedure Laterality Date  . BLADDER TUMOR EXCISION    . 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 BREAST TUMOR REMOVED    . CHOLECYSTECTOMY    . DILATION AND CURETTAGE  OF UTERUS    . HYSTEROSCOPY    . HYSTEROSCOPY W/D&C  05/18/2011   Procedure: DILATATION AND CURETTAGE /HYSTEROSCOPY;  Surgeon: Joy Laos, MD;  Location: [facility];  Service: Gynecology;  Laterality: N/A;  or Tuesday, March 12 7:30am? or Thursday, March 14 7:30am? or Tuesday March 19 7:30am?  . Needle Core Biospy  06/20/2011   Right Breast, 12'Oclock - Invasive Mammary Carcinoma with Lymphovascular Invasion: ER/PR 100%, Ki67 50%  . THROAT SURGERY     Joy. GRANULOMA  Gonzales    . TONSILLECTOMY AND ADENOIDECTOMY      FAMILY HISTORY Family History  Problem Relation Age of Onset  . Coronary artery disease Father   . Hypertension Father   . Heart failure Father   . Esophageal cancer Father 30  . Barrett's esophagus Brother   . Hypertension Brother   . Kidney cancer Sister 22  . Hypertension Sister   . Diabetes Sister   . Hypertension Sister   . Stroke Sister   . Breast cancer Maternal Grandmother 50  . Breast cancer Maternal Aunt        Age 69's  . Stomach cancer Paternal Uncle   . Breast cancer Other 55       triple negative; negative genetic testing  . Lung cancer Other 28  . Lung cancer Paternal Uncle   . Breast cancer Cousin        2 paternal cousins dx and died in their 64s  . Brain cancer Cousin    the patient's mothers mother was diagnosed with breast cancer the age of 66, and a second aunts on the same side was diagnosed with breast cancer the age of 35. The patient's niece, Joy Gonzales, is valid here for triple negative breast cancer. She has been found to be BRCA negative. Joy Gonzales herself has not been tested as of yet  GYNECOLOGIC HISTORY:  Menarche age 10, first live birth age 67, the patient is [place] P2. She went through menopause in her late 69s and took hormone replacement for about 10 years, and then Estring for about 5 years.   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 Her husband Joy Gonzales used to be in Press photographer for [employer]. He is retired as well. The patient's son is an intense or and runs his own company he are intact. Incidentally they tell me his wife is from [place] so those grandchildren are bilingual. The second son, Joy Gonzales, is disabled secondary to Crohn's disease. The patient has 4 grandchildren. She is a Tourist information centre manager    ADVANCED DIRECTIVES: in place   HEALTH MAINTENANCE: Social History  Substance Use Topics  . Smoking status: Never Smoker  . Smokeless tobacco: Never Used  . Alcohol use No      Comment: very rare     Colonoscopy:  PAP:  Bone density:  Lipid panel:  Allergies  Allergen Reactions  . Adhesive [Tape] Rash  . Felodipine Er Palpitations    tachycardia    Current Outpatient Prescriptions  Medication Sig Dispense Refill  . ALPRAZolam (XANAX) 0.25 MG tablet Take 0.125 mg by mouth daily as needed. Take infrequently    . aspirin 81 MG tablet Take 81 mg by mouth daily.      . Cholecalciferol (VITAMIN D-3 PO) Take 1 tablet by mouth daily.    . clobetasol cream (TEMOVATE) 0.05 % Apply topically 2 (two) times daily. 45 g 1  . esomeprazole (NEXIUM) 40 MG capsule Take  40 mg by mouth daily.      . fenofibrate micronized (LOFIBRA) 200 MG capsule Take 200 mg by mouth daily.      . fexofenadine (ALLEGRA) 180 MG tablet Take 180 mg by mouth as needed.      . metoprolol succinate (TOPROL XL) 25 MG 24 hr tablet Take 25 mg by mouth daily.      . Multiple Vitamin (MULTI-VITAMIN DAILY PO) Take 1 tablet by mouth daily.    Marland Kitchen olmesartan-hydrochlorothiazide (BENICAR HCT) 20-12.5 MG per tablet Take 1 tablet by mouth daily.    . tamoxifen (NOLVADEX) 20 MG tablet Take 1 tablet (20 mg total) by mouth daily. 90 tablet 4  . valACYclovir (VALTREX) 500 MG tablet take 1 tablet by mouth twice a day WHEN SHE HAS A FEVER BLISTER ONLY 30 tablet 2  . Vitamin D, Ergocalciferol, (DRISDOL) 50000 units CAPS capsule Take 1 capsule (50,000 Units total) by mouth every 7 (seven) days. 12 capsule 0  . Vitamin D, Ergocalciferol, (DRISDOL) 50000 units CAPS capsule take 1 capsule by mouth EVERY 7 DAYS 12 capsule 0   No current facility-administered medications for this visit.     OBJECTIVE: Middle-aged white woman in no acute distress  Vitals:   09/21/16 1000  BP: (!) 139/57  Pulse: 61  Resp: 18  Temp: 98.4 F (36.9 C)     Body mass index is 34.37 kg/m.    ECOG FS:1 - Symptomatic but completely ambulatory  Sclerae unicteric, EOMs intact Oropharynx clear and moist No cervical or supraclavicular  adenopathy Lungs no rales or rhonchi Heart regular rate and rhythm Abd soft, nontender, positive bowel sounds MSK no focal spinal tenderness, no upper extremity lymphedema Neuro: nonfocal, well oriented, appropriate affect Breasts: The right breast has undergone lumpectomy and radiation, with good cosmetic result and no evidence of local recurrence. The left breast is unremarkable. Both axillae are benign.  LAB RESULTS:  CMP     Component Value Date/Time   NA 142 09/14/2016 0952   K 4.1 09/14/2016 0952   CL 108 (H) 07/25/2012 1001   CO2 24 09/14/2016 0952   GLUCOSE 95 09/14/2016 0952   GLUCOSE 98 07/25/2012 1001   BUN 14.9 09/14/2016 0952   CREATININE 0.7 09/14/2016 0952   CALCIUM 9.4 09/14/2016 0952   PROT 6.3 (L) 09/14/2016 0952   ALBUMIN 3.7 09/14/2016 0952   AST 17 09/14/2016 0952   ALT 18 09/14/2016 0952   ALKPHOS 79 09/14/2016 0952   BILITOT 0.37 09/14/2016 0952   GFRNONAA 82 (L) 07/03/2011 1330   GFRAA >90 07/03/2011 1330    I No results found for: SPEP  Lab Results  Component Value Date   WBC 5.5 09/14/2016   NEUTROABS 2.7 09/14/2016   HGB 13.8 09/14/2016   HCT 41.6 09/14/2016   MCV 95.2 09/14/2016   PLT 145 09/14/2016      Chemistry      Component Value Date/Time   NA 142 09/14/2016 0952   K 4.1 09/14/2016 0952   CL 108 (H) 07/25/2012 1001   CO2 24 09/14/2016 0952   BUN 14.9 09/14/2016 0952   CREATININE 0.7 09/14/2016 0952      Component Value Date/Time   CALCIUM 9.4 09/14/2016 0952   ALKPHOS 79 09/14/2016 0952   AST 17 09/14/2016 0952   ALT 18 09/14/2016 0952   BILITOT 0.37 09/14/2016 0952       Lab Results  Component Value Date   LABCA2 12 06/28/2011    No components found  for: [id number]  No results for input(s): INR in the last 168 hours.  Urinalysis No results found for: COLORURINE  STUDIES: No results found.  ASSESSMENT: 76 y.o. BRCA negative [place] woman   (1) status post right breast upper outer quadrant lumpectomy  and sentinel lymph node dissection 07/05/2011 for a pT1c pN0, stage IA invasive ductal carcinoma, grade 3, estrogen receptor and progesterone receptor both 100% positive, HER-2 not amplified, with an MIB-1 of 50%.  (2) Oncotype DX score of 9 predicts a risk of outside the breast recurrence within 10 years of 6% if the patient's only systemic treatment is tamoxifen for 5 years. It also protects no benefit from chemotherapy   (3) the patient completed adjuvant radiation 10/05/2011  (4) started anastrozole August 2013, switched to tamoxifen October 2016  (5) DEXA scan at [facility] gynecology 02/03/2014 showed osteoporosis  (6) genetics testing through the [place] gene panel sent 03/10/2014 found no deleterious mutation in ATM, BARD1, BRCA1, BRCA2, BRIP1, CDH1, CHEK2, EPCAM, MLH1, MRE11A, MSH2, MSH6, MUTYH, NBN, NF1, PALB2, PMS2, PTEN, RAD50, RAD51C, RAD51D, SMARCA4, STK11, and TP53.  PLAN: Taniesha is now just a little over 5 years out from definitive surgery for her breast cancer with no evidence of disease recurrence. This is very favorable.  She is completing 5 years of anti-estrogens this month.  We discussed the fact that continuing tamoxifen an additional 5 years in someone with a prognosis as good as hers likely will not make any significant difference to the risk of recurrence, perhaps 1 or 2% at the most. Accordingly the plan is to stop tamoxifen when she runs out of the current supply. She does not need to taper off.  At this point I'm comfortable releasing her to her primary care physician. I'll she will need in terms of breast cancer follow-up is yearly mammography, which is due next week, and a yearly physician breast exam, which she usually obtains through Dr. Annamaria Boots.  I will be glad to see Miyeko at any point in the future if on when the need arises but as of now are making no further routine appointments for her here. Chauncey Cruel, MD   09/21/2016 10:06 AM

## 2016-09-26 ENCOUNTER — Ambulatory Visit
Admission: RE | Admit: 2016-09-26 | Discharge: 2016-09-26 | Disposition: A | Discharge status: Home / Self Care | Payer: Medicare Other | Source: Ambulatory Visit | Attending: Oncology | Admitting: Oncology

## 2016-09-26 DIAGNOSIS — R928 Other abnormal and inconclusive findings on diagnostic imaging of breast: Secondary | ICD-10-CM | POA: Diagnosis not present

## 2016-09-26 DIAGNOSIS — Z853 Personal history of malignant neoplasm of breast: Secondary | ICD-10-CM

## 2016-10-24 ENCOUNTER — Other Ambulatory Visit: Payer: Medicare Other

## 2016-10-24 DIAGNOSIS — E559 Vitamin D deficiency, unspecified: Secondary | ICD-10-CM

## 2016-10-25 LAB — VITAMIN D 25 HYDROXY (VIT D DEFICIENCY, FRACTURES): VIT D 25 HYDROXY: 31 ng/mL (ref 30–100)

## 2016-11-01 ENCOUNTER — Other Ambulatory Visit: Payer: Self-pay | Admitting: *Deleted

## 2016-11-01 DIAGNOSIS — E559 Vitamin D deficiency, unspecified: Secondary | ICD-10-CM

## 2016-11-01 MED ORDER — VITAMIN D (ERGOCALCIFEROL) 1.25 MG (50000 UNIT) PO CAPS: 50000.0000 [IU] | ORAL_CAPSULE | ORAL | 0 refills | Status: AC

## 2016-12-03 DIAGNOSIS — Z23 Encounter for immunization: Secondary | ICD-10-CM | POA: Diagnosis not present

## 2016-12-19 ENCOUNTER — Encounter: Payer: Medicare Other | Admitting: Women's Health

## 2017-01-02 DIAGNOSIS — M810 Age-related osteoporosis without current pathological fracture: Secondary | ICD-10-CM | POA: Diagnosis not present

## 2017-01-02 DIAGNOSIS — I10 Essential (primary) hypertension: Secondary | ICD-10-CM | POA: Diagnosis not present

## 2017-01-02 DIAGNOSIS — Z0000 Encounter for general adult medical examination without abnormal findings: Secondary | ICD-10-CM | POA: Diagnosis not present

## 2017-01-02 DIAGNOSIS — N390 Urinary tract infection, site not specified: Secondary | ICD-10-CM | POA: Diagnosis not present

## 2017-01-02 DIAGNOSIS — E7849 Other hyperlipidemia: Secondary | ICD-10-CM | POA: Diagnosis not present

## 2017-01-09 DIAGNOSIS — Z6834 Body mass index (BMI) 34.0-34.9, adult: Secondary | ICD-10-CM | POA: Diagnosis not present

## 2017-01-09 DIAGNOSIS — Z0000 Encounter for general adult medical examination without abnormal findings: Secondary | ICD-10-CM | POA: Diagnosis not present

## 2017-01-09 DIAGNOSIS — K219 Gastro-esophageal reflux disease without esophagitis: Secondary | ICD-10-CM | POA: Diagnosis not present

## 2017-01-09 DIAGNOSIS — M810 Age-related osteoporosis without current pathological fracture: Secondary | ICD-10-CM | POA: Diagnosis not present

## 2017-01-09 DIAGNOSIS — C679 Malignant neoplasm of bladder, unspecified: Secondary | ICD-10-CM | POA: Diagnosis not present

## 2017-01-09 DIAGNOSIS — K625 Hemorrhage of anus and rectum: Secondary | ICD-10-CM | POA: Diagnosis not present

## 2017-01-09 DIAGNOSIS — I10 Essential (primary) hypertension: Secondary | ICD-10-CM | POA: Diagnosis not present

## 2017-01-09 DIAGNOSIS — E7849 Other hyperlipidemia: Secondary | ICD-10-CM | POA: Diagnosis not present

## 2017-01-12 DIAGNOSIS — Z1212 Encounter for screening for malignant neoplasm of rectum: Secondary | ICD-10-CM | POA: Diagnosis not present

## 2017-01-22 ENCOUNTER — Other Ambulatory Visit: Payer: Self-pay | Admitting: Physician Assistant

## 2017-01-22 DIAGNOSIS — K625 Hemorrhage of anus and rectum: Secondary | ICD-10-CM | POA: Diagnosis not present

## 2017-01-22 DIAGNOSIS — K644 Residual hemorrhoidal skin tags: Secondary | ICD-10-CM | POA: Diagnosis not present

## 2017-01-22 DIAGNOSIS — Z86010 Personal history of colonic polyps: Secondary | ICD-10-CM | POA: Diagnosis not present

## 2017-01-26 ENCOUNTER — Ambulatory Visit
Admission: RE | Admit: 2017-01-26 | Discharge: 2017-01-26 | Disposition: A | Discharge status: Home / Self Care | Payer: Medicare Other | Source: Ambulatory Visit | Attending: Physician Assistant | Admitting: Physician Assistant

## 2017-01-26 DIAGNOSIS — K5730 Diverticulosis of large intestine without perforation or abscess without bleeding: Secondary | ICD-10-CM | POA: Diagnosis not present

## 2017-01-26 DIAGNOSIS — K625 Hemorrhage of anus and rectum: Secondary | ICD-10-CM

## 2017-02-05 ENCOUNTER — Ambulatory Visit (INDEPENDENT_AMBULATORY_CARE_PROVIDER_SITE_OTHER): Payer: Medicare Other | Admitting: Women's Health

## 2017-02-05 ENCOUNTER — Encounter: Payer: Self-pay | Admitting: Women's Health

## 2017-02-05 VITALS — BP 130/80 | Ht 62.0 in | Wt 188.0 lb

## 2017-02-05 DIAGNOSIS — M810 Age-related osteoporosis without current pathological fracture: Secondary | ICD-10-CM

## 2017-02-05 NOTE — Progress Notes (Signed)
Joy Gonzales [birth year] [id number]    History:    Presents for breast and pelvic exam with no complaints. Normal Pap history. Osteoporosis T score -3.9 but declined medication or treatment. Hypertension, hypercholesteremia primary care manages. Had had some rectal bleeding and had a negative barium enema. Significant past history 2000 bladder cancer, 2013 right breast cancer has finished 5 years of tamoxifen.  Past medical history, past surgical history, family history and social history were all reviewed and documented in the EPIC chart. Helps care for her husband who has a balance problem. Not sexually active. 2 sons both doing well.  ROS:  A ROS was performed and pertinent positives and negatives are included.  Exam:  Vitals:   02/05/17 1050  BP: 130/80  Weight: 188 lb (85.3 kg)  Height: 5\' 2"  (1.575 m)   Body mass index is 34.39 kg/m.   General appearance:  Normal Thyroid:  Symmetrical, normal in size, without palpable masses or nodularity. Respiratory  Auscultation:  Clear without wheezing or rhonchi Cardiovascular  Auscultation:  Regular rate, without rubs, murmurs or gallops  Edema/varicosities:  Not grossly evident Abdominal  Soft,nontender, without masses, guarding or rebound.  Liver/spleen:  No organomegaly noted  Hernia:  None appreciated  Skin  Inspection:  Grossly normal   Breasts: Examined lying and sitting.     Right: Without masses, retractions, discharge or axillary adenopathy.     Left: Without masses, retractions, discharge or axillary adenopathy. Gentitourinary   Inguinal/mons:  Normal without inguinal adenopathy  External genitalia:  Normal  BUS/Urethra/Skene's glands:  Normal  Vagina:  Normal  Cervix:  Normal  Uterus:  normal in size, shape and contour.  Midline and mobile  Adnexa/parametria:     Rt: Without masses or tenderness.   Lt: Without masses or tenderness.  Anus and perineum: Normal  Digital rectal exam: Normal sphincter tone without  palpated masses or tenderness  Assessment/Plan:  76 y.o. MWF G2 P2  for breast and pelvic exam with no complaints.  Postmenopausal/no HRT/no bleeding 2013 right breast cancer, completed 5 years of tamoxifen 03/2016 Osteoporosis T score -3.9, declines  Treatment Hypertension/hypercholesterolemia/GERD-primary care manages labs and meds 2000 bladder cancer History of rectal bleeding with negative barium enema and colonoscopy.  Plan: Osteoporosis discussed, declines medication, reviewed importance of increasing weightbearing exercise, home safety, fall prevention reviewed. Also discussed increasing practice of yoga, currently doing senior yoga one day a week. SBE's, continue annual screening mammogram. Vitamin D 2000 daily encouraged. Pap screening guidelines reviewed.  [place], 11:22 AM 02/05/2017

## 2017-02-05 NOTE — Patient Instructions (Signed)
Health Maintenance for Postmenopausal Women Menopause is a normal process in which your reproductive ability comes to an end. This process happens gradually over a span of months to years, usually between the ages of 22 and 9. Menopause is complete when you have missed 12 consecutive menstrual periods. It is important to talk with your health care provider about some of the most common conditions that affect postmenopausal women, such as heart disease, cancer, and bone loss (osteoporosis). Adopting a healthy lifestyle and getting preventive care can help to promote your health and wellness. Those actions can also lower your chances of developing some of these common conditions. What should I know about menopause? During menopause, you may experience a number of symptoms, such as:  Moderate-to-severe hot flashes.  Night sweats.  Decrease in sex drive.  Mood swings.  Headaches.  Tiredness.  Irritability.  Memory problems.  Insomnia.  Choosing to treat or not to treat menopausal changes is an individual decision that you make with your health care provider. What should I know about hormone replacement therapy and supplements? Hormone therapy products are effective for treating symptoms that are associated with menopause, such as hot flashes and night sweats. Hormone replacement carries certain risks, especially as you become older. If you are thinking about using estrogen or estrogen with progestin treatments, discuss the benefits and risks with your health care provider. What should I know about heart disease and stroke? Heart disease, heart attack, and stroke become more likely as you age. This may be due, in part, to the hormonal changes that your body experiences during menopause. These can affect how your body processes dietary fats, triglycerides, and cholesterol. Heart attack and stroke are both medical emergencies. There are many things that you can do to help prevent heart disease  and stroke:  Have your blood pressure checked at least every 1-2 years. High blood pressure causes heart disease and increases the risk of stroke.  If you are 53-22 years old, ask your health care provider if you should take aspirin to prevent a heart attack or a stroke.  Do not use any tobacco products, including cigarettes, chewing tobacco, or electronic cigarettes. If you need help quitting, ask your health care provider.  It is important to eat a healthy diet and maintain a healthy weight. ? Be sure to include plenty of vegetables, fruits, low-fat dairy products, and lean protein. ? Avoid eating foods that are high in solid fats, added sugars, or salt (sodium).  Get regular exercise. This is one of the most important things that you can do for your health. ? Try to exercise for at least 150 minutes each week. The type of exercise that you do should increase your heart rate and make you sweat. This is known as moderate-intensity exercise. ? Try to do strengthening exercises at least twice each week. Do these in addition to the moderate-intensity exercise.  Know your numbers.Ask your health care provider to check your cholesterol and your blood glucose. Continue to have your blood tested as directed by your health care provider.  What should I know about cancer screening? There are several types of cancer. Take the following steps to reduce your risk and to catch any cancer development as early as possible. Breast Cancer  Practice breast self-awareness. ? This means understanding how your breasts normally appear and feel. ? It also means doing regular breast self-exams. Let your health care provider know about any changes, no matter how small.  If you are 40  or older, have a clinician do a breast exam (clinical breast exam or CBE) every year. Depending on your age, family history, and medical history, it may be recommended that you also have a yearly breast X-ray (mammogram).  If you  have a family history of breast cancer, talk with your health care provider about genetic screening.  If you are at high risk for breast cancer, talk with your health care provider about having an MRI and a mammogram every year.  Breast cancer (BRCA) gene test is recommended for women who have family members with BRCA-related cancers. Results of the assessment will determine the need for genetic counseling and BRCA1 and for BRCA2 testing. BRCA-related cancers include these types: ? Breast. This occurs in males or females. ? Ovarian. ? Tubal. This may also be called fallopian tube cancer. ? Cancer of the abdominal or pelvic lining (peritoneal cancer). ? Prostate. ? Pancreatic.  Cervical, Uterine, and Ovarian Cancer Your health care provider may recommend that you be screened regularly for cancer of the pelvic organs. These include your ovaries, uterus, and vagina. This screening involves a pelvic exam, which includes checking for microscopic changes to the surface of your cervix (Pap test).  For women ages 21-65, health care providers may recommend a pelvic exam and a Pap test every three years. For women ages 79-65, they may recommend the Pap test and pelvic exam, combined with testing for human papilloma virus (HPV), every five years. Some types of HPV increase your risk of cervical cancer. Testing for HPV may also be done on women of any age who have unclear Pap test results.  Other health care providers may not recommend any screening for nonpregnant women who are considered low risk for pelvic cancer and have no symptoms. Ask your health care provider if a screening pelvic exam is right for you.  If you have had past treatment for cervical cancer or a condition that could lead to cancer, you need Pap tests and screening for cancer for at least 20 years after your treatment. If Pap tests have been discontinued for you, your risk factors (such as having a new sexual partner) need to be  reassessed to determine if you should start having screenings again. Some women have medical problems that increase the chance of getting cervical cancer. In these cases, your health care provider may recommend that you have screening and Pap tests more often.  If you have a family history of uterine cancer or ovarian cancer, talk with your health care provider about genetic screening.  If you have vaginal bleeding after reaching menopause, tell your health care provider.  There are currently no reliable tests available to screen for ovarian cancer.  Lung Cancer Lung cancer screening is recommended for adults 69-62 years old who are at high risk for lung cancer because of a history of smoking. A yearly low-dose CT scan of the lungs is recommended if you:  Currently smoke.  Have a history of at least 30 pack-years of smoking and you currently smoke or have quit within the past 15 years. A pack-year is smoking an average of one pack of cigarettes per day for one year.  Yearly screening should:  Continue until it has been 15 years since you quit.  Stop if you develop a health problem that would prevent you from having lung cancer treatment.  Colorectal Cancer  This type of cancer can be detected and can often be prevented.  Routine colorectal cancer screening usually begins at  age 42 and continues through age 45.  If you have risk factors for colon cancer, your health care provider may recommend that you be screened at an earlier age.  If you have a family history of colorectal cancer, talk with your health care provider about genetic screening.  Your health care provider may also recommend using home test kits to check for hidden blood in your stool.  A small camera at the end of a tube can be used to examine your colon directly (sigmoidoscopy or colonoscopy). This is done to check for the earliest forms of colorectal cancer.  Direct examination of the colon should be repeated every  5-10 years until age 71. However, if early forms of precancerous polyps or small growths are found or if you have a family history or genetic risk for colorectal cancer, you may need to be screened more often.  Skin Cancer  Check your skin from head to toe regularly.  Monitor any moles. Be sure to tell your health care provider: ? About any new moles or changes in moles, especially if there is a change in a mole's shape or color. ? If you have a mole that is larger than the size of a pencil eraser.  If any of your family members has a history of skin cancer, especially at a [place] age, talk with your health care provider about genetic screening.  Always use sunscreen. Apply sunscreen liberally and repeatedly throughout the day.  Whenever you are outside, protect yourself by wearing long sleeves, pants, a wide-brimmed hat, and sunglasses.  What should I know about osteoporosis? Osteoporosis is a condition in which bone destruction happens more quickly than new bone creation. After menopause, you may be at an increased risk for osteoporosis. To help prevent osteoporosis or the bone fractures that can happen because of osteoporosis, the following is recommended:  If you are 46-71 years old, get at least 1,000 mg of calcium and at least 600 mg of vitamin D per day.  If you are older than age 55 but younger than age 65, get at least 1,200 mg of calcium and at least 600 mg of vitamin D per day.  If you are older than age 54, get at least 1,200 mg of calcium and at least 800 mg of vitamin D per day.  Smoking and excessive alcohol intake increase the risk of osteoporosis. Eat foods that are rich in calcium and vitamin D, and do weight-bearing exercises several times each week as directed by your health care provider. What should I know about how menopause affects my mental health? Depression may occur at any age, but it is more common as you become older. Common symptoms of depression  include:  Low or sad mood.  Changes in sleep patterns.  Changes in appetite or eating patterns.  Feeling an overall lack of motivation or enjoyment of activities that you previously enjoyed.  Frequent crying spells.  Talk with your health care provider if you think that you are experiencing depression. What should I know about immunizations? It is important that you get and maintain your immunizations. These include:  Tetanus, diphtheria, and pertussis (Tdap) booster vaccine.  Influenza every year before the flu season begins.  Pneumonia vaccine.  Shingles vaccine.  Your health care provider may also recommend other immunizations. This information is not intended to replace advice given to you by your health care provider. Make sure you discuss any questions you have with your health care provider. Document Released: 04/21/2005  Document Revised: 09/17/2015 Document Reviewed: 12/01/2014 Elsevier Interactive Patient Education  2018 [employer]. Osteoporosis Osteoporosis happens when your bones become thinner and weaker. Weak bones can break (fracture) more easily when you slip or fall. Bones most at risk of breaking are in the hip, wrist, and spine. Follow these instructions at home:  Get enough calcium and vitamin D. These nutrients are good for your bones.  Exercise as told by your doctor.  Do not use any tobacco products. This includes cigarettes, chewing tobacco, and electronic cigarettes. If you need help quitting, ask your doctor.  Limit the amount of alcohol you drink.  Take medicines only as told by your doctor.  Keep all follow-up visits as told by your doctor. This is important.  Take care at home to prevent falls. Some ways to do this are: ? Keep rooms well lit and tidy. ? Put safety rails on your stairs. ? Put a rubber mat in the bathroom and other places that are often wet or slippery. Get help right away if:  You fall.  You hurt yourself. This  information is not intended to replace advice given to you by your health care provider. Make sure you discuss any questions you have with your health care provider. Document Released: 05/22/2011 Document Revised: 08/05/2015 Document Reviewed: 08/07/2013 Elsevier Interactive Patient Education  [employer].

## 2017-03-15 DIAGNOSIS — K625 Hemorrhage of anus and rectum: Secondary | ICD-10-CM | POA: Diagnosis not present

## 2017-03-15 DIAGNOSIS — R195 Other fecal abnormalities: Secondary | ICD-10-CM | POA: Diagnosis not present

## 2017-03-15 DIAGNOSIS — K580 Irritable bowel syndrome with diarrhea: Secondary | ICD-10-CM | POA: Diagnosis not present

## 2017-03-15 DIAGNOSIS — Z86010 Personal history of colonic polyps: Secondary | ICD-10-CM | POA: Diagnosis not present

## 2017-03-22 DIAGNOSIS — D692 Other nonthrombocytopenic purpura: Secondary | ICD-10-CM | POA: Diagnosis not present

## 2017-03-22 DIAGNOSIS — L570 Actinic keratosis: Secondary | ICD-10-CM | POA: Diagnosis not present

## 2017-03-22 DIAGNOSIS — D2371 Other benign neoplasm of skin of right lower limb, including hip: Secondary | ICD-10-CM | POA: Diagnosis not present

## 2017-03-22 DIAGNOSIS — L821 Other seborrheic keratosis: Secondary | ICD-10-CM | POA: Diagnosis not present

## 2017-03-22 DIAGNOSIS — L738 Other specified follicular disorders: Secondary | ICD-10-CM | POA: Diagnosis not present

## 2017-03-22 DIAGNOSIS — D1801 Hemangioma of skin and subcutaneous tissue: Secondary | ICD-10-CM | POA: Diagnosis not present

## 2017-03-22 DIAGNOSIS — D225 Melanocytic nevi of trunk: Secondary | ICD-10-CM | POA: Diagnosis not present

## 2017-03-22 DIAGNOSIS — Z85828 Personal history of other malignant neoplasm of skin: Secondary | ICD-10-CM | POA: Diagnosis not present

## 2017-05-14 ENCOUNTER — Other Ambulatory Visit: Payer: Self-pay

## 2017-05-14 DIAGNOSIS — B009 Herpesviral infection, unspecified: Secondary | ICD-10-CM

## 2017-05-14 MED ORDER — VALACYCLOVIR HCL 500 MG PO TABS: ORAL_TABLET | ORAL | 2 refills | Status: DC

## 2017-06-08 ENCOUNTER — Telehealth: Payer: Self-pay | Admitting: *Deleted

## 2017-06-08 MED ORDER — BETAMETHASONE DIPROPIONATE 0.05 % EX CREA: TOPICAL_CREAM | Freq: Two times a day (BID) | CUTANEOUS | 0 refills | Status: DC

## 2017-06-08 NOTE — Telephone Encounter (Signed)
Ok for refill betamethasone cream bid prn

## 2017-06-08 NOTE — Telephone Encounter (Signed)
Joy Gonzales pt said you prescribed betamethasone cream in 2017 for redness/itching for inner thighs, pt asked if new Rx can be given? please advise

## 2017-06-08 NOTE — Telephone Encounter (Signed)
Pt called left message requesting refill on betamethasone cream, I called pt and asked her to call me regarding this.

## 2017-06-08 NOTE — Telephone Encounter (Signed)
Rx sent, pt informed. 

## 2017-08-20 ENCOUNTER — Other Ambulatory Visit: Payer: Self-pay | Admitting: Internal Medicine

## 2017-08-20 DIAGNOSIS — Z1231 Encounter for screening mammogram for malignant neoplasm of breast: Secondary | ICD-10-CM

## 2017-10-09 ENCOUNTER — Ambulatory Visit: Payer: Medicare Other

## 2017-10-22 ENCOUNTER — Telehealth: Payer: Self-pay | Admitting: *Deleted

## 2017-10-22 ENCOUNTER — Encounter: Payer: Self-pay | Admitting: Women's Health

## 2017-10-22 ENCOUNTER — Ambulatory Visit (INDEPENDENT_AMBULATORY_CARE_PROVIDER_SITE_OTHER): Payer: Medicare Other | Admitting: Women's Health

## 2017-10-22 VITALS — BP 124/80

## 2017-10-22 DIAGNOSIS — Z853 Personal history of malignant neoplasm of breast: Secondary | ICD-10-CM

## 2017-10-22 DIAGNOSIS — L900 Lichen sclerosus et atrophicus: Secondary | ICD-10-CM | POA: Diagnosis not present

## 2017-10-22 DIAGNOSIS — N63 Unspecified lump in unspecified breast: Secondary | ICD-10-CM

## 2017-10-22 DIAGNOSIS — N898 Other specified noninflammatory disorders of vagina: Secondary | ICD-10-CM

## 2017-10-22 LAB — WET PREP FOR TRICH, YEAST, CLUE

## 2017-10-22 MED ORDER — TERCONAZOLE 0.4 % VA CREA: 1.0000 | TOPICAL_CREAM | Freq: Every day | VAGINAL | 1 refills | Status: DC

## 2017-10-22 MED ORDER — CLOBETASOL PROPIONATE 0.05 % EX CREA
TOPICAL_CREAM | Freq: Two times a day (BID) | CUTANEOUS | 1 refills | Status: DC
Start: 2017-10-22 — End: 2019-04-08

## 2017-10-22 NOTE — Telephone Encounter (Signed)
Patient scheduled on 10/24/17 @ 8:30am at breast center. Pt informed.

## 2017-10-22 NOTE — Patient Instructions (Signed)

## 2017-10-22 NOTE — Progress Notes (Signed)
77 year old MWF G2, P2 presents with complaint of outside vaginal itching for the past week, questionable breast lump on left inner breast. July 9 fell on her back porch causing a wrought iron chair to fall directly on her left breast caused a large bruise that turned black in 2 days which has now resolved.  Reports feeling a nodule inside the bruised area that has now become smaller.  Significant history 2013 right breast cancer completed 5 years of tamoxifen.  Also history of bladder cancer in 2000.  Osteoporosis T score -3.9,  declined medication.  No fractures with recent fall.  Not sexually active.  Her medical problems include hypertension, IBS and lichen sclerosis.  Denies urinary symptoms, abdominal pain or fever.  Exam: Appears well.  Breast examined sitting and lying position right breast well-healed lumpectomy site some puckering at incisional site without palpable nodules.  Left breast at 8-9 position questionable small 1 cm nodule most likely resolving bruise.  No visible puckering, dimpling, nipple discharge on left breast, mild erythema at previous bruising site. External genitalia erythemic at introitus, no visible discharge, odor or lichen sclerosis noted, wet prep done with a Q-tip negative.  Questionable left breast nodule at 8 to 9 o'clock position Vaginal itching  Plan: We will get diagnostic left breast mammogram with ultrasound.  Annual mammogram due in September.  Terazol 7 1 applicator at bedtime x7 or apply externally as needed, open to air as able, instructed to call if continued problems or no relief.  Uses Temovate sparingly small amount at clitoral hood which is not inflamed today.

## 2017-10-22 NOTE — Telephone Encounter (Signed)
-----   Message from Huel Cote, NP sent at 10/22/2017 11:43 AM EDT ----- Needs lt breast diag mammogram, had a fall July 9 and hit lt breast causing a burise had a lump, now much better with questionable palpbale nodule/area at 8-9, mammogram due in September.  2013 rt breast cancer and completed 5 years of tamoxifen. Breast center.  Tues or Wed or Thurs are best.

## 2017-10-24 ENCOUNTER — Ambulatory Visit
Admission: RE | Admit: 2017-10-24 | Discharge: 2017-10-24 | Disposition: A | Discharge status: Home / Self Care | Payer: Medicare Other | Source: Ambulatory Visit | Attending: Women's Health | Admitting: Women's Health

## 2017-10-24 ENCOUNTER — Other Ambulatory Visit: Payer: Medicare Other

## 2017-10-24 DIAGNOSIS — N63 Unspecified lump in unspecified breast: Secondary | ICD-10-CM

## 2017-10-24 DIAGNOSIS — Z853 Personal history of malignant neoplasm of breast: Secondary | ICD-10-CM

## 2017-10-24 DIAGNOSIS — R928 Other abnormal and inconclusive findings on diagnostic imaging of breast: Secondary | ICD-10-CM | POA: Diagnosis not present

## 2017-10-24 DIAGNOSIS — N6489 Other specified disorders of breast: Secondary | ICD-10-CM | POA: Diagnosis not present

## 2017-10-24 HISTORY — DX: Personal history of irradiation: Z92.3

## 2017-11-30 DIAGNOSIS — Z85828 Personal history of other malignant neoplasm of skin: Secondary | ICD-10-CM | POA: Diagnosis not present

## 2017-11-30 DIAGNOSIS — L720 Epidermal cyst: Secondary | ICD-10-CM | POA: Diagnosis not present

## 2017-11-30 DIAGNOSIS — D220 Melanocytic nevi of lip: Secondary | ICD-10-CM | POA: Diagnosis not present

## 2017-12-04 ENCOUNTER — Ambulatory Visit: Payer: Medicare Other

## 2017-12-11 DIAGNOSIS — Z23 Encounter for immunization: Secondary | ICD-10-CM | POA: Diagnosis not present

## 2018-01-10 DIAGNOSIS — I10 Essential (primary) hypertension: Secondary | ICD-10-CM | POA: Diagnosis not present

## 2018-01-10 DIAGNOSIS — M810 Age-related osteoporosis without current pathological fracture: Secondary | ICD-10-CM | POA: Diagnosis not present

## 2018-01-10 DIAGNOSIS — E7849 Other hyperlipidemia: Secondary | ICD-10-CM | POA: Diagnosis not present

## 2018-01-17 DIAGNOSIS — E668 Other obesity: Secondary | ICD-10-CM | POA: Diagnosis not present

## 2018-01-17 DIAGNOSIS — E7849 Other hyperlipidemia: Secondary | ICD-10-CM | POA: Diagnosis not present

## 2018-01-17 DIAGNOSIS — I10 Essential (primary) hypertension: Secondary | ICD-10-CM | POA: Diagnosis not present

## 2018-01-17 DIAGNOSIS — C679 Malignant neoplasm of bladder, unspecified: Secondary | ICD-10-CM | POA: Diagnosis not present

## 2018-01-17 DIAGNOSIS — M810 Age-related osteoporosis without current pathological fracture: Secondary | ICD-10-CM | POA: Diagnosis not present

## 2018-01-17 DIAGNOSIS — K625 Hemorrhage of anus and rectum: Secondary | ICD-10-CM | POA: Diagnosis not present

## 2018-01-17 DIAGNOSIS — Z0000 Encounter for general adult medical examination without abnormal findings: Secondary | ICD-10-CM | POA: Diagnosis not present

## 2018-01-17 DIAGNOSIS — Z1389 Encounter for screening for other disorder: Secondary | ICD-10-CM | POA: Diagnosis not present

## 2018-01-17 DIAGNOSIS — N390 Urinary tract infection, site not specified: Secondary | ICD-10-CM | POA: Diagnosis not present

## 2018-01-17 DIAGNOSIS — Z6834 Body mass index (BMI) 34.0-34.9, adult: Secondary | ICD-10-CM | POA: Diagnosis not present

## 2018-01-17 DIAGNOSIS — R3129 Other microscopic hematuria: Secondary | ICD-10-CM | POA: Diagnosis not present

## 2018-01-25 DIAGNOSIS — Z1212 Encounter for screening for malignant neoplasm of rectum: Secondary | ICD-10-CM | POA: Diagnosis not present

## 2018-01-29 DIAGNOSIS — N390 Urinary tract infection, site not specified: Secondary | ICD-10-CM | POA: Diagnosis not present

## 2018-01-29 DIAGNOSIS — R82998 Other abnormal findings in urine: Secondary | ICD-10-CM | POA: Diagnosis not present

## 2018-02-01 DIAGNOSIS — Z8551 Personal history of malignant neoplasm of bladder: Secondary | ICD-10-CM | POA: Diagnosis not present

## 2018-05-31 ENCOUNTER — Other Ambulatory Visit: Payer: Self-pay | Admitting: Women's Health

## 2018-05-31 DIAGNOSIS — B009 Herpesviral infection, unspecified: Secondary | ICD-10-CM

## 2018-07-17 DIAGNOSIS — L821 Other seborrheic keratosis: Secondary | ICD-10-CM | POA: Diagnosis not present

## 2018-07-17 DIAGNOSIS — D1801 Hemangioma of skin and subcutaneous tissue: Secondary | ICD-10-CM | POA: Diagnosis not present

## 2018-07-17 DIAGNOSIS — Z85828 Personal history of other malignant neoplasm of skin: Secondary | ICD-10-CM | POA: Diagnosis not present

## 2018-07-17 DIAGNOSIS — L720 Epidermal cyst: Secondary | ICD-10-CM | POA: Diagnosis not present

## 2018-08-20 ENCOUNTER — Other Ambulatory Visit: Payer: Self-pay | Admitting: Women's Health

## 2018-08-20 DIAGNOSIS — Z1231 Encounter for screening mammogram for malignant neoplasm of breast: Secondary | ICD-10-CM

## 2018-10-29 ENCOUNTER — Ambulatory Visit
Admission: RE | Admit: 2018-10-29 | Discharge: 2018-10-29 | Disposition: A | Discharge status: Home / Self Care | Payer: Medicare Other | Source: Ambulatory Visit | Attending: Women's Health | Admitting: Women's Health

## 2018-10-29 ENCOUNTER — Other Ambulatory Visit: Payer: Self-pay

## 2018-10-29 DIAGNOSIS — Z1231 Encounter for screening mammogram for malignant neoplasm of breast: Secondary | ICD-10-CM | POA: Diagnosis not present

## 2018-11-06 DIAGNOSIS — E785 Hyperlipidemia, unspecified: Secondary | ICD-10-CM | POA: Diagnosis not present

## 2018-11-06 DIAGNOSIS — R0609 Other forms of dyspnea: Secondary | ICD-10-CM | POA: Diagnosis not present

## 2018-11-06 DIAGNOSIS — R110 Nausea: Secondary | ICD-10-CM | POA: Diagnosis not present

## 2018-11-06 DIAGNOSIS — I10 Essential (primary) hypertension: Secondary | ICD-10-CM | POA: Diagnosis not present

## 2018-11-06 DIAGNOSIS — R079 Chest pain, unspecified: Secondary | ICD-10-CM | POA: Diagnosis not present

## 2018-11-11 ENCOUNTER — Encounter: Payer: Self-pay | Admitting: Cardiology

## 2018-11-11 ENCOUNTER — Ambulatory Visit (INDEPENDENT_AMBULATORY_CARE_PROVIDER_SITE_OTHER): Payer: Medicare Other | Admitting: Cardiology

## 2018-11-11 ENCOUNTER — Other Ambulatory Visit: Payer: Self-pay

## 2018-11-11 VITALS — BP 142/74 | HR 69 | Ht 62.0 in | Wt 192.0 lb

## 2018-11-11 DIAGNOSIS — I4510 Unspecified right bundle-branch block: Secondary | ICD-10-CM | POA: Diagnosis not present

## 2018-11-11 DIAGNOSIS — I10 Essential (primary) hypertension: Secondary | ICD-10-CM

## 2018-11-11 DIAGNOSIS — R42 Dizziness and giddiness: Secondary | ICD-10-CM | POA: Diagnosis not present

## 2018-11-11 DIAGNOSIS — E7800 Pure hypercholesterolemia, unspecified: Secondary | ICD-10-CM

## 2018-11-11 DIAGNOSIS — R0609 Other forms of dyspnea: Secondary | ICD-10-CM

## 2018-11-11 DIAGNOSIS — R0600 Dyspnea, unspecified: Secondary | ICD-10-CM
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
## 2018-11-14 ENCOUNTER — Encounter: Payer: Self-pay | Admitting: Cardiology

## 2018-11-27 ENCOUNTER — Other Ambulatory Visit: Payer: Medicare Other

## 2018-11-28 ENCOUNTER — Ambulatory Visit (INDEPENDENT_AMBULATORY_CARE_PROVIDER_SITE_OTHER): Payer: Medicare Other

## 2018-11-28 ENCOUNTER — Other Ambulatory Visit: Payer: Self-pay

## 2018-11-28 DIAGNOSIS — R0609 Other forms of dyspnea: Secondary | ICD-10-CM

## 2018-11-28 DIAGNOSIS — R0600 Dyspnea, unspecified: Secondary | ICD-10-CM

## 2018-11-28 DIAGNOSIS — R42 Dizziness and giddiness: Secondary | ICD-10-CM | POA: Diagnosis not present

## 2018-12-01 ENCOUNTER — Other Ambulatory Visit: Payer: Self-pay | Admitting: Cardiology

## 2018-12-09 ENCOUNTER — Ambulatory Visit (INDEPENDENT_AMBULATORY_CARE_PROVIDER_SITE_OTHER): Payer: Medicare Other

## 2018-12-09 ENCOUNTER — Other Ambulatory Visit: Payer: Self-pay

## 2018-12-09 DIAGNOSIS — R0609 Other forms of dyspnea: Secondary | ICD-10-CM | POA: Diagnosis not present

## 2018-12-09 DIAGNOSIS — R0600 Dyspnea, unspecified: Secondary | ICD-10-CM

## 2018-12-16 ENCOUNTER — Encounter: Payer: Self-pay | Admitting: Cardiology

## 2018-12-16 ENCOUNTER — Other Ambulatory Visit: Payer: Self-pay

## 2018-12-16 ENCOUNTER — Ambulatory Visit (INDEPENDENT_AMBULATORY_CARE_PROVIDER_SITE_OTHER): Payer: Medicare Other | Admitting: Cardiology

## 2018-12-16 VITALS — BP 125/76 | HR 70 | Ht 62.0 in | Wt 189.1 lb

## 2018-12-16 DIAGNOSIS — R0600 Dyspnea, unspecified: Secondary | ICD-10-CM | POA: Diagnosis not present

## 2018-12-16 DIAGNOSIS — E668 Other obesity: Secondary | ICD-10-CM | POA: Diagnosis not present

## 2018-12-16 DIAGNOSIS — I4510 Unspecified right bundle-branch block: Secondary | ICD-10-CM

## 2018-12-16 DIAGNOSIS — R42 Dizziness and giddiness: Secondary | ICD-10-CM

## 2018-12-16 DIAGNOSIS — I10 Essential (primary) hypertension: Secondary | ICD-10-CM | POA: Diagnosis not present

## 2018-12-16 DIAGNOSIS — R0609 Other forms of dyspnea: Secondary | ICD-10-CM

## 2018-12-16 DIAGNOSIS — E7800 Pure hypercholesterolemia, unspecified: Secondary | ICD-10-CM
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
## 2018-12-26 DIAGNOSIS — Z23 Encounter for immunization: Secondary | ICD-10-CM | POA: Diagnosis not present

## 2018-12-30 NOTE — Progress Notes (Signed)
11/06/2018: Creatinine 0.8, EGFR 69, potassium 4.1, CMP normal.  CBC normal.

## 2019-01-20 DIAGNOSIS — E7849 Other hyperlipidemia: Secondary | ICD-10-CM | POA: Diagnosis not present

## 2019-01-20 DIAGNOSIS — M810 Age-related osteoporosis without current pathological fracture: Secondary | ICD-10-CM | POA: Diagnosis not present

## 2019-01-27 DIAGNOSIS — Z1331 Encounter for screening for depression: Secondary | ICD-10-CM | POA: Diagnosis not present

## 2019-01-27 DIAGNOSIS — Z0000 Encounter for general adult medical examination without abnormal findings: Secondary | ICD-10-CM | POA: Diagnosis not present

## 2019-01-27 DIAGNOSIS — R1013 Epigastric pain: Secondary | ICD-10-CM | POA: Diagnosis not present

## 2019-01-27 DIAGNOSIS — M810 Age-related osteoporosis without current pathological fracture: Secondary | ICD-10-CM | POA: Diagnosis not present

## 2019-01-27 DIAGNOSIS — R0609 Other forms of dyspnea: Secondary | ICD-10-CM | POA: Diagnosis not present

## 2019-01-27 DIAGNOSIS — Z1339 Encounter for screening examination for other mental health and behavioral disorders: Secondary | ICD-10-CM | POA: Diagnosis not present

## 2019-01-27 DIAGNOSIS — E669 Obesity, unspecified: Secondary | ICD-10-CM | POA: Diagnosis not present

## 2019-01-27 DIAGNOSIS — I10 Essential (primary) hypertension: Secondary | ICD-10-CM | POA: Diagnosis not present

## 2019-01-27 DIAGNOSIS — R82998 Other abnormal findings in urine: Secondary | ICD-10-CM | POA: Diagnosis not present

## 2019-01-27 DIAGNOSIS — E785 Hyperlipidemia, unspecified: Secondary | ICD-10-CM | POA: Diagnosis not present

## 2019-01-27 DIAGNOSIS — K219 Gastro-esophageal reflux disease without esophagitis: Secondary | ICD-10-CM | POA: Diagnosis not present

## 2019-01-27 DIAGNOSIS — C679 Malignant neoplasm of bladder, unspecified: Secondary | ICD-10-CM | POA: Diagnosis not present

## 2019-02-04 ENCOUNTER — Other Ambulatory Visit: Payer: Self-pay | Admitting: Internal Medicine

## 2019-02-04 DIAGNOSIS — R1013 Epigastric pain: Secondary | ICD-10-CM

## 2019-02-10 ENCOUNTER — Encounter: Payer: Medicare Other | Admitting: Women's Health

## 2019-02-12 DIAGNOSIS — Z8551 Personal history of malignant neoplasm of bladder: Secondary | ICD-10-CM | POA: Diagnosis not present

## 2019-02-17 ENCOUNTER — Ambulatory Visit: Payer: Medicare Other | Admitting: Cardiology

## 2019-02-18 ENCOUNTER — Encounter: Payer: Medicare Other | Admitting: Women's Health

## 2019-02-19 ENCOUNTER — Ambulatory Visit
Admission: RE | Admit: 2019-02-19 | Discharge: 2019-02-19 | Disposition: A | Discharge status: Home / Self Care | Payer: Medicare Other | Source: Ambulatory Visit | Attending: Internal Medicine | Admitting: Internal Medicine

## 2019-02-19 DIAGNOSIS — K6389 Other specified diseases of intestine: Secondary | ICD-10-CM | POA: Diagnosis not present

## 2019-02-19 DIAGNOSIS — K5730 Diverticulosis of large intestine without perforation or abscess without bleeding: Secondary | ICD-10-CM | POA: Diagnosis not present

## 2019-02-19 DIAGNOSIS — K760 Fatty (change of) liver, not elsewhere classified: Secondary | ICD-10-CM | POA: Diagnosis not present

## 2019-02-19 DIAGNOSIS — K7689 Other specified diseases of liver: Secondary | ICD-10-CM | POA: Diagnosis not present

## 2019-02-19 DIAGNOSIS — I700 Atherosclerosis of aorta: Secondary | ICD-10-CM | POA: Diagnosis not present

## 2019-02-19 DIAGNOSIS — Z9049 Acquired absence of other specified parts of digestive tract: Secondary | ICD-10-CM | POA: Diagnosis not present

## 2019-02-19 DIAGNOSIS — M47816 Spondylosis without myelopathy or radiculopathy, lumbar region: Secondary | ICD-10-CM | POA: Diagnosis not present

## 2019-02-19 DIAGNOSIS — N281 Cyst of kidney, acquired: Secondary | ICD-10-CM | POA: Diagnosis not present

## 2019-02-19 DIAGNOSIS — R1013 Epigastric pain: Secondary | ICD-10-CM

## 2019-02-19 MED ORDER — IOPAMIDOL (ISOVUE-300) INJECTION 61%
100.0000 mL | Freq: Once | INTRAVENOUS | Status: AC | PRN
  Administered 2019-02-19: 100 mL via INTRAVENOUS

## 2019-04-08 ENCOUNTER — Other Ambulatory Visit: Payer: Self-pay

## 2019-04-08 ENCOUNTER — Ambulatory Visit (INDEPENDENT_AMBULATORY_CARE_PROVIDER_SITE_OTHER): Payer: Medicare Other | Admitting: Women's Health

## 2019-04-08 ENCOUNTER — Encounter: Payer: Self-pay | Admitting: Women's Health

## 2019-04-08 VITALS — BP 124/78 | Ht 61.5 in | Wt 189.0 lb

## 2019-04-08 DIAGNOSIS — Z124 Encounter for screening for malignant neoplasm of cervix: Secondary | ICD-10-CM | POA: Diagnosis not present

## 2019-04-08 DIAGNOSIS — Z01419 Encounter for gynecological examination (general) (routine) without abnormal findings: Secondary | ICD-10-CM

## 2019-04-08 DIAGNOSIS — Z9289 Personal history of other medical treatment: Secondary | ICD-10-CM | POA: Diagnosis not present

## 2019-04-08 DIAGNOSIS — M810 Age-related osteoporosis without current pathological fracture: Secondary | ICD-10-CM | POA: Diagnosis not present

## 2019-04-08 DIAGNOSIS — B009 Herpesviral infection, unspecified: Secondary | ICD-10-CM | POA: Diagnosis not present

## 2019-04-08 DIAGNOSIS — M816 Localized osteoporosis [Lequesne]: Secondary | ICD-10-CM

## 2019-04-08 DIAGNOSIS — L900 Lichen sclerosus et atrophicus: Secondary | ICD-10-CM | POA: Diagnosis not present

## 2019-04-08 MED ORDER — VALACYCLOVIR HCL 500 MG PO TABS: ORAL_TABLET | ORAL | 6 refills | Status: DC

## 2019-04-08 MED ORDER — CLOBETASOL PROPIONATE 0.05 % EX CREA: TOPICAL_CREAM | Freq: Two times a day (BID) | CUTANEOUS | 3 refills | Status: DC

## 2019-04-08 NOTE — Addendum Note (Signed)
Addended by: Thamas Jaegers on: 04/08/2019 03:33 PM   Modules accepted: Orders

## 2019-04-08 NOTE — Progress Notes (Signed)
Joy Gonzales June 24, 1940 [id number]    History:    Presents for breast and pelvic exam. 79-year-old  Normal Pap history.  2013 right breast cancer completed 5 years of tamoxifen. 79-year-old  2018 T score -3.9 at [facility], bilateral hip average -2, declines further testing.  HSV-1 rare outbreaks, lichen sclerosis, Temovate.  Has had Shingrix and pneumonia vaccines.  02/2019 - CT of abdomen and pelvis.  2010 - colonoscopy.. 2000 bladder cancer.  Primary care manages hypertension, GERD, IBS.  Past medical history, past surgical history, family history and social history were all reviewed and documented in the EPIC chart.  Helps care for her husband who has neuropathy.  2 children and grandchildren that she visits with.    ROS:  A ROS was performed and pertinent positives and negatives are included.  Exam:  Vitals:   04/08/19 1416  BP: 124/78  Weight: 189 lb (85.7 kg)  Height: 5' 1.5" (1.562 m)   Body mass index is 35.13 kg/m.   General appearance:  Normal Thyroid:  Symmetrical, normal in size, without palpable masses or nodularity. Respiratory  Auscultation:  Clear without wheezing or rhonchi Cardiovascular  Auscultation:  Regular rate, without rubs, murmurs or gallops  Edema/varicosities:  Not grossly evident Abdominal  Soft,nontender, without masses, guarding or rebound.  Liver/spleen:  No organomegaly noted  Hernia:  None appreciated  Skin  Inspection:  Grossly normal   Breasts: Examined lying and sitting.     Right: Without masses, retractions, discharge or axillary adenopathy.     Left: Without masses, retractions, discharge or axillary adenopathy. Gentitourinary   Inguinal/mons:  Normal without inguinal adenopathy  External genitalia:  Normal  BUS/Urethra/Skene's glands:  Normal  Vagina: Atrophic   Cervix:  Normal  Uterus: normal in size, shape and contour.  Midline and mobile  Adnexa/parametria:     Rt: Without masses or tenderness.   Lt: Without masses or tenderness.  Anus and  perineum: Normal  Digital rectal exam: Normal sphincter tone without palpated masses or tenderness  Assessment/Plan:  79 y.o. MWF G2 P2 for breast and pelvic exam with no complaints.  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  Encouraged A&D ointment externally after showers to external genitalia.  Valtrex 500 twice daily for 3 to 5 days.  Reviewed importance of weightbearing and balance type exercise, yoga encouraged.  Vitamin D over-the-counter 2000 IUs daily.  Home safety, fall prevention discussed.  Pap screening guidelines reviewed.  Declines further testing for DEXA or treatment.  Plans to get the Covid test as soon as it is available.  SBEs, continue annual 3D screening mammogram.      Huel Cote [facility], 2:32 PM 04/08/2019

## 2019-04-08 NOTE — Patient Instructions (Addendum)
It was nice seeing you today Vitamin D 2000 IUs daily Yoga A&D ointment vag Health Maintenance After Age 81 After age 48, you are at a higher risk for certain long-term diseases and infections as well as injuries from falls. Falls are a major cause of broken bones and head injuries in people who are older than age 36. Getting regular preventive care can help to keep you healthy and well. Preventive care includes getting regular testing and making lifestyle changes as recommended by your health care provider. Talk with your health care provider about:  Which screenings and tests you should have. A screening is a test that checks for a disease when you have no symptoms.  A diet and exercise plan that is right for you. What should I know about screenings and tests to prevent falls? Screening and testing are the best ways to find a health problem early. Early diagnosis and treatment give you the best chance of managing medical conditions that are common after age 52. Certain conditions and lifestyle choices may make you more likely to have a fall. Your health care provider may recommend:  Regular vision checks. Poor vision and conditions such as cataracts can make you more likely to have a fall. If you wear glasses, make sure to get your prescription updated if your vision changes.  Medicine review. Work with your health care provider to regularly review all of the medicines you are taking, including over-the-counter medicines. Ask your health care provider about any side effects that may make you more likely to have a fall. Tell your health care provider if any medicines that you take make you feel dizzy or sleepy.  Osteoporosis screening. Osteoporosis is a condition that causes the bones to get weaker. This can make the bones weak and cause them to break more easily.  Blood pressure screening. Blood pressure changes and medicines to control blood pressure can make you feel dizzy.  Strength and  balance checks. Your health care provider may recommend certain tests to check your strength and balance while standing, walking, or changing positions.  Foot health exam. Foot pain and numbness, as well as not wearing proper footwear, can make you more likely to have a fall.  Depression screening. You may be more likely to have a fall if you have a fear of falling, feel emotionally low, or feel unable to do activities that you used to do.  Alcohol use screening. Using too much alcohol can affect your balance and may make you more likely to have a fall. What actions can I take to lower my risk of falls? General instructions  Talk with your health care provider about your risks for falling. Tell your health care provider if: ? You fall. Be sure to tell your health care provider about all falls, even ones that seem minor. ? You feel dizzy, sleepy, or off-balance.  Take over-the-counter and prescription medicines only as told by your health care provider. These include any supplements.  Eat a healthy diet and maintain a healthy weight. A healthy diet includes low-fat dairy products, low-fat (lean) meats, and fiber from whole grains, beans, and lots of fruits and vegetables. Home safety  Remove any tripping hazards, such as rugs, cords, and clutter.  Install safety equipment such as grab bars in bathrooms and safety rails on stairs.  Keep rooms and walkways well-lit. Activity   Follow a regular exercise program to stay fit. This will help you maintain your balance. Ask your health care provider  what types of exercise are appropriate for you.  If you need a cane or walker, use it as recommended by your health care provider.  Wear supportive shoes that have nonskid soles. Lifestyle  Do not drink alcohol if your health care provider tells you not to drink.  If you drink alcohol, limit how much you have: ? 0-1 drink a day for women. ? 0-2 drinks a day for men.  Be aware of how much  alcohol is in your drink. In the [place] one drink equals one typical bottle of beer (12 oz), one-half glass of wine (5 oz), or one shot of hard liquor (1 oz).  Do not use any products that contain nicotine or tobacco, such as cigarettes and e-cigarettes. If you need help quitting, ask your health care provider. Summary  Having a healthy lifestyle and getting preventive care can help to protect your health and wellness after age 66.  Screening and testing are the best way to find a health problem early and help you avoid having a fall. Early diagnosis and treatment give you the best chance for managing medical conditions that are more common for people who are older than age 52.  Falls are a major cause of broken bones and head injuries in people who are older than age 21. Take precautions to prevent a fall at home.  Work with your health care provider to learn what changes you can make to improve your health and wellness and to prevent falls. This information is not intended to replace advice given to you by your health care provider. Make sure you discuss any questions you have with your health care provider. Document Revised: 06/20/2018 Document Reviewed: 01/10/2017 Elsevier Patient Education  2020 [employer].

## 2019-04-09 ENCOUNTER — Telehealth: Payer: Self-pay | Admitting: *Deleted

## 2019-04-09 NOTE — Telephone Encounter (Signed)
Medication approved via [employer] until 04/08/2020

## 2019-04-09 NOTE — Telephone Encounter (Signed)
Prior authorization for clobetasol cream 0.05% cream done via cover my meds. Will wait for response.

## 2019-04-19 ENCOUNTER — Ambulatory Visit: Payer: Medicare Other | Attending: Internal Medicine

## 2019-04-19 DIAGNOSIS — Z23 Encounter for immunization: Secondary | ICD-10-CM | POA: Insufficient documentation

## 2019-04-19 NOTE — Progress Notes (Signed)
   Covid-19 Vaccination Clinic  Name:  Joy Gonzales    MRN: [id number] DOB: 05-Jun-1940  04/19/2019  [name] was observed post Covid-19 immunization for 15 minutes without incidence. She was provided with Vaccine Information Sheet and instruction to access the V-Safe system.   [name] was instructed to call 911 with any severe reactions post vaccine: Marland Kitchen Difficulty breathing  . Swelling of your face and throat  . A fast heartbeat  . A bad rash all over your body  . Dizziness and weakness    Immunizations Administered    Name Date Dose VIS Date Route   Pfizer COVID-19 Vaccine 04/19/2019  9:19 AM 0.3 mL 02/21/2019 Intramuscular   Manufacturer: [place]   Lot: [id number]   [place]: [id number]

## 2019-05-13 ENCOUNTER — Ambulatory Visit: Payer: Medicare Other

## 2019-05-13 ENCOUNTER — Ambulatory Visit: Payer: Medicare Other | Attending: Internal Medicine

## 2019-05-13 DIAGNOSIS — Z23 Encounter for immunization: Secondary | ICD-10-CM

## 2019-05-13 NOTE — Progress Notes (Signed)
   Covid-19 Vaccination Clinic  Name:  Joy Gonzales    MRN: [id number] DOB: 05-04-1940  05/13/2019  Joy Gonzales was observed post Covid-19 immunization for 15 minutes without incident. She was provided with Vaccine Information Sheet and instruction to access the V-Safe system.   Joy Gonzales was instructed to call 911 with any severe reactions post vaccine: Marland Kitchen Difficulty breathing  . Swelling of face and throat  . A fast heartbeat  . A bad rash all over body  . Dizziness and weakness   Immunizations Administered    Name Date Dose VIS Date Route   Pfizer COVID-19 Vaccine 05/13/2019  4:22 PM 0.3 mL 02/21/2019 Intramuscular   Manufacturer: [place]   Lot: [id number]   [place]: [id number]

## 2019-07-04 ENCOUNTER — Other Ambulatory Visit: Payer: Self-pay | Admitting: Women's Health

## 2019-07-04 DIAGNOSIS — B009 Herpesviral infection, unspecified: Secondary | ICD-10-CM

## 2019-07-18 ENCOUNTER — Other Ambulatory Visit: Payer: Self-pay | Admitting: *Deleted

## 2019-07-18 DIAGNOSIS — B009 Herpesviral infection, unspecified: Secondary | ICD-10-CM

## 2019-07-18 MED ORDER — VALACYCLOVIR HCL 500 MG PO TABS: ORAL_TABLET | ORAL | 0 refills | Status: DC

## 2019-09-25 ENCOUNTER — Other Ambulatory Visit: Payer: Self-pay | Admitting: Women's Health

## 2019-09-25 ENCOUNTER — Other Ambulatory Visit: Payer: Self-pay | Admitting: Internal Medicine

## 2019-09-25 DIAGNOSIS — Z1231 Encounter for screening mammogram for malignant neoplasm of breast: Secondary | ICD-10-CM

## 2019-10-22 DIAGNOSIS — D0439 Carcinoma in situ of skin of other parts of face: Secondary | ICD-10-CM | POA: Diagnosis not present

## 2019-10-22 DIAGNOSIS — D1801 Hemangioma of skin and subcutaneous tissue: Secondary | ICD-10-CM | POA: Diagnosis not present

## 2019-10-22 DIAGNOSIS — Z85828 Personal history of other malignant neoplasm of skin: Secondary | ICD-10-CM | POA: Diagnosis not present

## 2019-10-22 DIAGNOSIS — D2262 Melanocytic nevi of left upper limb, including shoulder: Secondary | ICD-10-CM | POA: Diagnosis not present

## 2019-10-22 DIAGNOSIS — L814 Other melanin hyperpigmentation: Secondary | ICD-10-CM | POA: Diagnosis not present

## 2019-10-22 DIAGNOSIS — L720 Epidermal cyst: Secondary | ICD-10-CM | POA: Diagnosis not present

## 2019-10-22 DIAGNOSIS — L821 Other seborrheic keratosis: Secondary | ICD-10-CM | POA: Diagnosis not present

## 2019-10-22 DIAGNOSIS — D485 Neoplasm of uncertain behavior of skin: Secondary | ICD-10-CM | POA: Diagnosis not present

## 2019-10-22 DIAGNOSIS — D692 Other nonthrombocytopenic purpura: Secondary | ICD-10-CM | POA: Diagnosis not present

## 2019-10-22 DIAGNOSIS — L738 Other specified follicular disorders: Secondary | ICD-10-CM | POA: Diagnosis not present

## 2019-11-21 DIAGNOSIS — D0439 Carcinoma in situ of skin of other parts of face: Secondary | ICD-10-CM | POA: Diagnosis not present

## 2019-11-25 ENCOUNTER — Other Ambulatory Visit: Payer: Self-pay

## 2019-11-25 ENCOUNTER — Ambulatory Visit
Admission: RE | Admit: 2019-11-25 | Discharge: 2019-11-25 | Disposition: A | Discharge status: Home / Self Care | Payer: Medicare Other | Source: Ambulatory Visit | Attending: Internal Medicine | Admitting: Internal Medicine

## 2019-11-25 DIAGNOSIS — Z1231 Encounter for screening mammogram for malignant neoplasm of breast: Secondary | ICD-10-CM | POA: Diagnosis not present

## 2019-12-20 DIAGNOSIS — Z23 Encounter for immunization: Secondary | ICD-10-CM | POA: Diagnosis not present

## 2020-01-08 DIAGNOSIS — Z23 Encounter for immunization: Secondary | ICD-10-CM | POA: Diagnosis not present

## 2020-01-26 DIAGNOSIS — I10 Essential (primary) hypertension: Secondary | ICD-10-CM | POA: Diagnosis not present

## 2020-01-26 DIAGNOSIS — E119 Type 2 diabetes mellitus without complications: Secondary | ICD-10-CM | POA: Diagnosis not present

## 2020-01-26 DIAGNOSIS — E785 Hyperlipidemia, unspecified: Secondary | ICD-10-CM | POA: Diagnosis not present

## 2020-02-02 DIAGNOSIS — Z0000 Encounter for general adult medical examination without abnormal findings: Secondary | ICD-10-CM | POA: Diagnosis not present

## 2020-02-02 DIAGNOSIS — K219 Gastro-esophageal reflux disease without esophagitis: Secondary | ICD-10-CM | POA: Diagnosis not present

## 2020-02-02 DIAGNOSIS — E669 Obesity, unspecified: Secondary | ICD-10-CM | POA: Diagnosis not present

## 2020-02-02 DIAGNOSIS — M810 Age-related osteoporosis without current pathological fracture: Secondary | ICD-10-CM | POA: Diagnosis not present

## 2020-02-02 DIAGNOSIS — E785 Hyperlipidemia, unspecified: Secondary | ICD-10-CM | POA: Diagnosis not present

## 2020-02-02 DIAGNOSIS — I10 Essential (primary) hypertension: Secondary | ICD-10-CM | POA: Diagnosis not present

## 2020-02-02 DIAGNOSIS — G4700 Insomnia, unspecified: Secondary | ICD-10-CM | POA: Diagnosis not present

## 2020-02-02 DIAGNOSIS — R82998 Other abnormal findings in urine: Secondary | ICD-10-CM | POA: Diagnosis not present

## 2020-02-02 DIAGNOSIS — E119 Type 2 diabetes mellitus without complications: Secondary | ICD-10-CM | POA: Diagnosis not present

## 2020-05-20 ENCOUNTER — Other Ambulatory Visit: Payer: Self-pay | Admitting: *Deleted

## 2020-05-20 DIAGNOSIS — L900 Lichen sclerosus et atrophicus: Secondary | ICD-10-CM

## 2020-05-20 MED ORDER — CLOBETASOL PROPIONATE 0.05 % EX CREA: TOPICAL_CREAM | Freq: Two times a day (BID) | CUTANEOUS | 0 refills | Status: DC

## 2020-05-20 NOTE — Telephone Encounter (Signed)
Note sent to pharmacy pt needs to make appt to obtain more refills

## 2020-06-11 ENCOUNTER — Other Ambulatory Visit: Payer: Self-pay | Admitting: Internal Medicine

## 2020-06-11 DIAGNOSIS — Z1231 Encounter for screening mammogram for malignant neoplasm of breast: Secondary | ICD-10-CM

## 2020-06-29 ENCOUNTER — Other Ambulatory Visit: Payer: Self-pay

## 2020-06-29 ENCOUNTER — Encounter: Payer: Self-pay | Admitting: Nurse Practitioner

## 2020-06-29 ENCOUNTER — Ambulatory Visit (INDEPENDENT_AMBULATORY_CARE_PROVIDER_SITE_OTHER): Payer: Medicare Other | Admitting: Nurse Practitioner

## 2020-06-29 VITALS — BP 124/82 | Ht 61.0 in | Wt 183.0 lb

## 2020-06-29 DIAGNOSIS — B372 Candidiasis of skin and nail: Secondary | ICD-10-CM

## 2020-06-29 DIAGNOSIS — Z01419 Encounter for gynecological examination (general) (routine) without abnormal findings: Secondary | ICD-10-CM | POA: Diagnosis not present

## 2020-06-29 DIAGNOSIS — M816 Localized osteoporosis [Lequesne]: Secondary | ICD-10-CM

## 2020-06-29 DIAGNOSIS — L900 Lichen sclerosus et atrophicus: Secondary | ICD-10-CM

## 2020-06-29 DIAGNOSIS — B009 Herpesviral infection, unspecified: Secondary | ICD-10-CM

## 2020-06-29 MED ORDER — CLOBETASOL PROPIONATE 0.05 % EX CREA: TOPICAL_CREAM | Freq: Two times a day (BID) | CUTANEOUS | 0 refills | Status: DC

## 2020-06-29 MED ORDER — NYSTATIN 100000 UNIT/GM EX POWD
1.0000 | Freq: Three times a day (TID) | CUTANEOUS | 0 refills | Status: AC
Start: 2020-06-29 — End: ?

## 2020-06-29 MED ORDER — VALACYCLOVIR HCL 500 MG PO TABS: ORAL_TABLET | ORAL | 1 refills | Status: DC

## 2020-06-29 NOTE — Patient Instructions (Signed)
Health Maintenance After Age 82 After age 82, you are at a higher risk for certain long-term diseases and infections as well as injuries from falls. Falls are a major cause of broken bones and head injuries in people who are older than age 82. Getting regular preventive care can help to keep you healthy and well. Preventive care includes getting regular testing and making lifestyle changes as recommended by your health care provider. Talk with your health care provider about:  Which screenings and tests you should have. A screening is a test that checks for a disease when you have no symptoms.  A diet and exercise plan that is right for you. What should I know about screenings and tests to prevent falls? Screening and testing are the best ways to find a health problem early. Early diagnosis and treatment give you the best chance of managing medical conditions that are common after age 82. Certain conditions and lifestyle choices may make you more likely to have a fall. Your health care provider may recommend:  Regular vision checks. Poor vision and conditions such as cataracts can make you more likely to have a fall. If you wear glasses, make sure to get your prescription updated if your vision changes.  Medicine review. Work with your health care provider to regularly review all of the medicines you are taking, including over-the-counter medicines. Ask your health care provider about any side effects that may make you more likely to have a fall. Tell your health care provider if any medicines that you take make you feel dizzy or sleepy.  Osteoporosis screening. Osteoporosis is a condition that causes the bones to get weaker. This can make the bones weak and cause them to break more easily.  Blood pressure screening. Blood pressure changes and medicines to control blood pressure can make you feel dizzy.  Strength and balance checks. Your health care provider may recommend certain tests to check your  strength and balance while standing, walking, or changing positions.  Foot health exam. Foot pain and numbness, as well as not wearing proper footwear, can make you more likely to have a fall.  Depression screening. You may be more likely to have a fall if you have a fear of falling, feel emotionally low, or feel unable to do activities that you used to do.  Alcohol use screening. Using too much alcohol can affect your balance and may make you more likely to have a fall. What actions can I take to lower my risk of falls? General instructions  Talk with your health care provider about your risks for falling. Tell your health care provider if: ? You fall. Be sure to tell your health care provider about all falls, even ones that seem minor. ? You feel dizzy, sleepy, or off-balance.  Take over-the-counter and prescription medicines only as told by your health care provider. These include any supplements.  Eat a healthy diet and maintain a healthy weight. A healthy diet includes low-fat dairy products, low-fat (lean) meats, and fiber from whole grains, beans, and lots of fruits and vegetables. Home safety  Remove any tripping hazards, such as rugs, cords, and clutter.  Install safety equipment such as grab bars in bathrooms and safety rails on stairs.  Keep rooms and walkways well-lit. Activity  Follow a regular exercise program to stay fit. This will help you maintain your balance. Ask your health care provider what types of exercise are appropriate for you.  If you need a cane or walker,   use it as recommended by your health care provider.  Wear supportive shoes that have nonskid soles.   Lifestyle  Do not drink alcohol if your health care provider tells you not to drink.  If you drink alcohol, limit how much you have: ? 0-1 drink a day for women. ? 0-2 drinks a day for men.  Be aware of how much alcohol is in your drink. In the [place] one drink equals one typical bottle of beer (12  oz), one-half glass of wine (5 oz), or one shot of hard liquor (1 oz).  Do not use any products that contain nicotine or tobacco, such as cigarettes and e-cigarettes. If you need help quitting, ask your health care provider. Summary  Having a healthy lifestyle and getting preventive care can help to protect your health and wellness after age 82.  Screening and testing are the best way to find a health problem early and help you avoid having a fall. Early diagnosis and treatment give you the best chance for managing medical conditions that are more common for people who are older than age 82.  Falls are a major cause of broken bones and head injuries in people who are older than age 82. Take precautions to prevent a fall at home.  Work with your health care provider to learn what changes you can make to improve your health and wellness and to prevent falls. This information is not intended to replace advice given to you by your health care provider. Make sure you discuss any questions you have with your health care provider. Document Revised: 06/20/2018 Document Reviewed: 01/10/2017 Elsevier Patient Education  2021 Elsevier Inc.  

## 2020-06-29 NOTE — Progress Notes (Signed)
Joy Gonzales 08-20-1940 [id number]   History:  80 y.o. G2P2002 presents for breast and pelvic exam. Postmenopausal - no HRT, no bleeding. Normal pap history. 2013 right breast cancer managed with lumpectomy and radiation. 2000 bladder cancer. History of osteoporosis, declines further screenings and treatment. History of LS managed with Clobetasol and HSV-1 managed with Valtrex as needed for outbreaks. Complains of vaginal irritation and thinks it is related to her urinary incontinence. She wears panty liners and changes them often. She sees urology in May. 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   Gynecologic History Patient's last menstrual period was 03/13/1990.   Contraception: post menopausal status  Health Maintenance Last Pap: No longer screening per guidelines Last mammogram: 11/25/2019. Results were: normal Last colonoscopy: 2010 Last Dexa: 03/2016. Results were: T-score -3.9 at spine  Past medical history, past surgical history, family history and social history were all reviewed and documented in the EPIC chart. Married. Caregiver for husband. 2 children, 4 grandchildren.   ROS:  A ROS was performed and pertinent positives and negatives are included.  Exam:  Vitals:   06/29/20 1111  BP: 124/82  Weight: 183 lb (83 kg)  Height: 5\' 1"  (1.549 m)   Body mass index is 34.58 kg/m.  General appearance:  Normal Thyroid:  Symmetrical, normal in size, without palpable masses or nodularity. Respiratory  Auscultation:  Clear without wheezing or rhonchi Cardiovascular  Auscultation:  Regular rate, without rubs, murmurs or gallops  Edema/varicosities:  Not grossly evident Abdominal  Soft,nontender, without masses, guarding or rebound.  Liver/spleen:  No organomegaly noted  Hernia:  None appreciated  Skin  Inspection:  Grossly normal Breasts: Examined lying and sitting.   Right: Without masses, retractions, nipple discharge or axillary  adenopathy.   Left: Without masses, retractions, nipple discharge or axillary adenopathy. Gentitourinary   Inguinal/mons:  Normal without inguinal adenopathy  External genitalia:  Thin, pearly appearance of vulva, perineum, and anal region with redness surrounding, consistent with LS. Fusing of posterior vestibule.   BUS/Urethra/Skene's glands:  Normal  Vagina:  Normal appearing with normal color and discharge, no lesions. Atrophic changes.  Cervix:  Normal appearing without discharge or lesions  Uterus:  Normal in size, shape and contour.  Midline and mobile, nontender  Adnexa/parametria:     Rt: Normal in size, without masses or tenderness.   Lt: Normal in size, without masses or tenderness.  Anus and perineum: Thin, pearly appearance with red borders consistent with LS  Digital rectal exam: Normal sphincter tone without palpated masses or tenderness  Assessment/Plan:  80 y.o. Joy Gonzales for breast and pelvic exam.   Well female exam with routine gynecological exam - Education provided on SBEs, importance of preventative screenings, current guidelines, high calcium diet, regular exercise, and multivitamin daily. Labs with PCP.   Localized osteoporosis without current pathological fracture - 2018 T-score - 3.9 at spine. She is currently on high dose Vitamin D 3 times per week prescribed by PCP. Recommend regular exercise and resistance training. She declines further screenings and treatment.   Lichen sclerosus - Plan: clobetasol cream (TEMOVATE) 0.05 % twice weekly. Recommend using A & D ointment as barrier for urinary incontinence and change pads frequently to prevent vulvar irritation.   HSV-1 (herpes simplex virus 1) infection - Plan: valACYclovir (VALTREX) 500 MG tablet as needed for outbreaks.   Skin yeast infection - Plan: nystatin (MYCOSTATIN/NYSTOP) powder apply twice daily to groin/lower abdominal fold x 7 days. Keep area clean and dry to prevent recurrence.  Screening for  cervical cancer - Normal Pap history.  No longer screening per guidelines.   Screening for breast cancer - Normal mammogram history.  Continue annual screenings.  Normal breast exam today.  Screening for colon cancer - 2010 colonoscopy. Screenings no longer recommended per GI.   Return in 1 year for annual.       Joy Gammon DNP, 11:33 AM 06/29/2020

## 2020-07-08 ENCOUNTER — Encounter (HOSPITAL_COMMUNITY): Payer: Self-pay

## 2020-07-08 ENCOUNTER — Other Ambulatory Visit: Payer: Self-pay

## 2020-07-08 ENCOUNTER — Emergency Department (HOSPITAL_COMMUNITY): Payer: Medicare Other

## 2020-07-08 ENCOUNTER — Emergency Department (HOSPITAL_COMMUNITY)
Admission: EM | Admit: 2020-07-08 | Discharge: 2020-07-08 | Disposition: A | Discharge status: Home / Self Care | Payer: Medicare Other | Attending: Emergency Medicine | Admitting: Emergency Medicine

## 2020-07-08 DIAGNOSIS — Z7982 Long term (current) use of aspirin: Secondary | ICD-10-CM | POA: Insufficient documentation

## 2020-07-08 DIAGNOSIS — R1111 Vomiting without nausea: Secondary | ICD-10-CM | POA: Diagnosis not present

## 2020-07-08 DIAGNOSIS — R404 Transient alteration of awareness: Secondary | ICD-10-CM | POA: Diagnosis not present

## 2020-07-08 DIAGNOSIS — R112 Nausea with vomiting, unspecified: Secondary | ICD-10-CM | POA: Diagnosis not present

## 2020-07-08 DIAGNOSIS — R29810 Facial weakness: Secondary | ICD-10-CM | POA: Diagnosis not present

## 2020-07-08 DIAGNOSIS — Z8551 Personal history of malignant neoplasm of bladder: Secondary | ICD-10-CM | POA: Diagnosis not present

## 2020-07-08 DIAGNOSIS — I10 Essential (primary) hypertension: Secondary | ICD-10-CM | POA: Diagnosis not present

## 2020-07-08 DIAGNOSIS — Z79899 Other long term (current) drug therapy: Secondary | ICD-10-CM | POA: Diagnosis not present

## 2020-07-08 DIAGNOSIS — R4182 Altered mental status, unspecified: Secondary | ICD-10-CM | POA: Diagnosis not present

## 2020-07-08 DIAGNOSIS — I4510 Unspecified right bundle-branch block: Secondary | ICD-10-CM | POA: Diagnosis not present

## 2020-07-08 LAB — URINALYSIS, ROUTINE W REFLEX MICROSCOPIC
Bacteria, UA: NONE SEEN
Bilirubin Urine: NEGATIVE
Glucose, UA: NEGATIVE mg/dL
Ketones, ur: NEGATIVE mg/dL
Nitrite: NEGATIVE
Protein, ur: NEGATIVE mg/dL
Specific Gravity, Urine: 1.011 (ref 1.005–1.030)
pH: 6 (ref 5.0–8.0)

## 2020-07-08 LAB — COMPREHENSIVE METABOLIC PANEL
ALT: 28 U/L (ref 0–44)
AST: 25 U/L (ref 15–41)
Albumin: 4 g/dL (ref 3.5–5.0)
Alkaline Phosphatase: 76 U/L (ref 38–126)
Anion gap: 8 (ref 5–15)
BUN: 22 mg/dL (ref 8–23)
CO2: 23 mmol/L (ref 22–32)
Calcium: 9 mg/dL (ref 8.9–10.3)
Chloride: 109 mmol/L (ref 98–111)
Creatinine, Ser: 0.8 mg/dL (ref 0.44–1.00)
GFR, Estimated: >60 mL/min (ref >60)
Glucose, Bld: 141 mg/dL — ABNORMAL HIGH (ref 70–99)
Potassium: 3.6 mmol/L (ref 3.5–5.1)
Sodium: 140 mmol/L (ref 135–145)
Total Bilirubin: 0.5 mg/dL (ref 0.3–1.2)
Total Protein: 6.8 g/dL (ref 6.5–8.1)

## 2020-07-08 LAB — CBC WITH DIFFERENTIAL/PLATELET
Abs Immature Granulocytes: 0.08 10*3/uL — ABNORMAL HIGH (ref 0.00–0.07)
Basophils Absolute: 0.1 10*3/uL (ref 0.0–0.1)
Basophils Relative: 1 %
Eosinophils Absolute: 0.1 10*3/uL (ref 0.0–0.5)
Eosinophils Relative: 1 %
HCT: 44 % (ref 36.0–46.0)
Hemoglobin: 14.8 g/dL (ref 12.0–15.0)
Immature Granulocytes: 1 %
Lymphocytes Relative: 9 %
Lymphs Abs: 1.1 10*3/uL (ref 0.7–4.0)
MCH: 31 pg (ref 26.0–34.0)
MCHC: 33.6 g/dL (ref 30.0–36.0)
MCV: 92.2 fL (ref 80.0–100.0)
Monocytes Absolute: 0.6 10*3/uL (ref 0.1–1.0)
Monocytes Relative: 5 %
Neutro Abs: 9.9 10*3/uL — ABNORMAL HIGH (ref 1.7–7.7)
Neutrophils Relative %: 83 %
Platelets: 196 10*3/uL (ref 150–400)
RBC: 4.77 MIL/uL (ref 3.87–5.11)
RDW: 13 % (ref 11.5–15.5)
WBC: 11.8 10*3/uL — ABNORMAL HIGH (ref 4.0–10.5)
nRBC: 0 % (ref 0.0–0.2)

## 2020-07-08 MED ORDER — ONDANSETRON 4 MG PO TBDP
4.0000 mg | ORAL_TABLET | Freq: Three times a day (TID) | ORAL | 0 refills | Status: AC | PRN
Start: 2020-07-08 — End: ?

## 2020-07-08 MED ORDER — LACTATED RINGERS IV BOLUS
1000.0000 mL | Freq: Once | INTRAVENOUS | Status: AC
  Administered 2020-07-08: 1000 mL via INTRAVENOUS

## 2020-07-08 NOTE — ED Triage Notes (Signed)
Pt arrives EMS from home after waking up 2 hours prior to arrival with sudden nausea, vomiting and somewhat chronic diarrhea. EMS inserted IV and administered 500cc NS and 4mg  zofran.

## 2020-07-08 NOTE — ED Provider Notes (Signed)
[facility] DEPARTMENT Provider Note  CSN: [id number] Arrival date & time: 07/08/20 0140    History Chief Complaint  Patient presents with  . Vomiting    HPI  Joy Gonzales is a 80 y.o. female with history of IBS with frequent stools reports around 9pm tonight she began feeling nauseated and had several episodes of nonbloody non bilious emesis. She thought it may have been something she ate, but did not have any abdominal pain. She took some pepto bismol without relief. She called her son (at bedside now) who came over around 11pm to check on her and noted she was continuing to have vomiting spells as well as increased stool frequency. Around 1am the son reports the patient told him she 'didn't feel right' and she was concerned she was about to have a stroke. She did not have any slurred speech, facial droop, arm or leg weakness but she seems confused (couldn't remember where her BP cuff or medications were) so EMS was called. She is back to baseline now, give Zofran by EMS with resolution of her nausea.   Past Medical History:  Diagnosis Date  . Atrophic vaginitis   . Bladder cancer ([place])   . Breast cancer ([place])   . Diverticulosis   . Dyspnea    on exertion  . Elevated LFTs   . Flushing   . GERD (gastroesophageal reflux disease)   . HSV-1 infection   . HTN (hypertension)   . Hyperlipemia   . IBS (irritable bowel syndrome)   . Lichen sclerosus   . Osteopenia   . Osteoporosis 2018  . Personal history of radiation therapy 2013  . PONV (postoperative nausea and vomiting)   . Rectocele   . S/P radiation therapy 08/24/11 - 10/05/11   Right Breast: 50 Gy/25 Fractions with boost of 10Gy/5 Fractions  . Tinnitus   . Use of anastrozole (Arimidex) 10/25/11  . Vitamin D deficiency   . Vocal cord granuloma     Past Surgical History:  Procedure Laterality Date  . BLADDER TUMOR EXCISION    . 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 BREAST TUMOR REMOVED    . CHOLECYSTECTOMY    . DILATION AND CURETTAGE OF UTERUS    . HYSTEROSCOPY    . HYSTEROSCOPY WITH D & C  05/18/2011   Procedure: DILATATION AND CURETTAGE /HYSTEROSCOPY;  Surgeon: Bennetta Laos, MD;  Location: [facility];  Service: Gynecology;  Laterality: N/A;  or Tuesday, March 12 7:30am? or Thursday, March 14 7:30am? or Tuesday March 19 7:30am?  . Needle Core Biospy  06/20/2011   Right Breast, 12'Oclock - Invasive Mammary Carcinoma with Lymphovascular Invasion: ER/PR 100%, Ki67 50%  . THROAT SURGERY     LARYN. GRANULOMA  POLYP  . TONSILLECTOMY AND ADENOIDECTOMY      Family History  Problem Relation Age of Onset  . Coronary artery disease Father   . Hypertension Father   . Heart failure Father   . Esophageal cancer Father 1  . Barrett's esophagus Brother   . Hypertension Brother   . Kidney cancer Sister 64  . Hypertension Sister   . Diabetes Sister   . Hypertension Sister   . Stroke Sister   . Breast cancer Maternal Grandmother 69  . Breast cancer Maternal Aunt        Age 59's  . Stomach cancer  Paternal Uncle   . Breast cancer Other 55       triple negative; negative genetic testing  . Lung cancer Other 45  . Lung cancer Paternal Uncle   . Breast cancer Cousin        2 paternal cousins dx and died in their 49s  . Brain cancer Cousin   . Crohn's disease Son     Social History   Tobacco Use  . Smoking status: Never Smoker  . Smokeless tobacco: Never Used  Vaping Use  . Vaping Use: Never used  Substance Use Topics  . Alcohol use: Yes    Comment: very rare  . Drug use: No     Home Medications Prior to Admission medications   Medication Sig Start Date End Date Taking? Authorizing Provider  ondansetron (ZOFRAN ODT) 4 MG disintegrating tablet Take 1 tablet (4 mg total) by mouth every 8 (eight) hours as needed for nausea or vomiting. 07/08/20  Yes Truddie Hidden, MD  ALPRAZolam Duanne Moron) 0.25 MG tablet Take 0.125 mg by mouth daily as needed. Take infrequently    [provider]  aspirin 81 MG tablet Take 81 mg by mouth daily.    [provider]  clobetasol cream (TEMOVATE) 0.05 % Apply topically 2 (two) times daily. 06/29/20   [name], NP  esomeprazole (NEXIUM) 40 MG capsule Take 40 mg by mouth daily.    [provider]  fenofibrate micronized (LOFIBRA) 200 MG capsule Take 200 mg by mouth daily.    [provider]  fexofenadine (ALLEGRA) 180 MG tablet Take 180 mg by mouth as needed.    [provider]  metoprolol succinate (TOPROL-XL) 25 MG 24 hr tablet Take 25 mg by mouth daily.    [provider]  nystatin (MYCOSTATIN/NYSTOP) powder Apply 1 application topically 3 (three) times daily. 06/29/20   [name], NP  olmesartan-hydrochlorothiazide (BENICAR HCT) 20-12.5 MG per tablet Take 1 tablet by mouth daily.    [provider]  valACYclovir (VALTREX) 500 MG tablet TAKE 1 TABLET TWICE DAILY FOR 3 TO 5 DAYS AS NEEDED 06/29/20   Marny Lowenstein A, NP  Vitamin D, Ergocalciferol, (DRISDOL) 50000 units CAPS capsule Take 1 capsule (50,000 Units total) by mouth every 7 (seven) days. Patient taking differently: Take 50,000 Units by mouth. Three times a week 11/01/16   Fontaine, Belinda Block, MD     Allergies    Other, Adhesive [tape], and Felodipine er   Review of Systems   Review of Systems A comprehensive review of systems was completed and negative except as noted in HPI.    Physical Exam BP 137/66   Pulse 75   Temp 98 F (36.7 C) (Oral)   Resp 17   Ht 5' 1" (1.549 m)   Wt 83 kg   LMP 03/13/1990   SpO2 94%   BMI 34.58 kg/m   Physical Exam Vitals and nursing note reviewed.  Constitutional:      Appearance: Normal appearance.  HENT:     Head: Normocephalic and atraumatic.     Nose: Nose normal.     Mouth/Throat:     Mouth: Mucous membranes are moist.  Eyes:      Extraocular Movements: Extraocular movements intact.     Conjunctiva/sclera: Conjunctivae normal.  Cardiovascular:     Rate and Rhythm: Normal rate.  Pulmonary:     Effort: Pulmonary effort is normal.     Breath sounds: Normal breath sounds.  Abdominal:  General: Abdomen is flat. Bowel sounds are normal.     Palpations: Abdomen is soft.     Tenderness: There is no abdominal tenderness. There is no guarding or rebound.  Musculoskeletal:        General: No swelling. Normal range of motion.     Cervical back: Neck supple.  Skin:    General: Skin is warm and dry.  Neurological:     General: No focal deficit present.     Mental Status: She is alert and oriented to person, place, and time.     Cranial Nerves: No cranial nerve deficit.     Sensory: No sensory deficit.     Motor: No weakness.  Psychiatric:        Mood and Affect: Mood normal.      ED Results / Procedures / Treatments   Labs (all labs ordered are listed, but only abnormal results are displayed) Labs Reviewed  COMPREHENSIVE METABOLIC PANEL - Abnormal; Notable for the following components:      Result Value   Glucose, Bld 141 (*)    All other components within normal limits  CBC WITH DIFFERENTIAL/PLATELET - Abnormal; Notable for the following components:   WBC 11.8 (*)    Neutro Abs 9.9 (*)    Abs Immature Granulocytes 0.08 (*)    All other components within normal limits  URINALYSIS, ROUTINE W REFLEX MICROSCOPIC - Abnormal; Notable for the following components:   Color, Urine STRAW (*)    Hgb urine dipstick MODERATE (*)    Leukocytes,Ua TRACE (*)    All other components within normal limits    EKG None  Radiology CT Head Wo Contrast  Result Date: 07/08/2020 CLINICAL DATA:  Altered mental status, nausea, vomiting EXAM: CT HEAD WITHOUT CONTRAST TECHNIQUE: Contiguous axial images were obtained from the base of the skull through the vertex without intravenous contrast. COMPARISON:  CTA 05/13/2008 FINDINGS:  Brain: Normal anatomic configuration. Parenchymal volume loss is commensurate with the patient's age. Mild periventricular white matter changes are present likely reflecting the sequela of small vessel ischemia. Soft tissue in the region of the cavernous sinuses bilaterally with punctate foci of calcification represents tortuous carotid siphons bilaterally and is unchanged. No abnormal intra or extra-axial mass lesion or fluid collection. No abnormal mass effect or midline shift. No evidence of acute intracranial hemorrhage or infarct. Ventricular size is normal. Cerebellum unremarkable. Vascular: No asymmetric hyperdense vasculature at the skull base. Skull: Intact Sinuses/Orbits: Paranasal sinuses are clear. Orbits are unremarkable. Other: Mastoid air cells and middle ear cavities are clear. IMPRESSION: No acute intracranial abnormality.  Mild senescent change. Electronically Signed   By: Fidela Salisbury MD   On: 07/08/2020 04:03    Procedures Procedures  Medications Ordered in the ED Medications  lactated ringers bolus 1,000 mL (1,000 mLs Intravenous New Bag/Given 07/08/20 0309)     MDM Rules/Calculators/A&P MDM Patient here with GI symptoms but also an episode of transient confusion which has resolved. No reported focal deficits. Will check her labs, head CT, give IVF and reassess.  ED Course  I have reviewed the triage vital signs and the nursing notes.  Pertinent labs & imaging results that were available during my care of the patient were reviewed by me and considered in my medical decision making (see chart for details).  Clinical Course as of 07/08/20 0444  Thu Jul 08, 2020  0333 CBC with mild leukocytosis. Otherwise normal.  [CS]  0403 UA neg for infection.  [CS]  0406 CT  neg for acute process.  [CS]  6269 CMP is unremarkable.  [CS]  [id number] Patient remains asymptomatic. Awake and alert and at baseline. Will give PO Trial and plan discharge home if tolerates well.  [CS]    Clinical  Course User Index [CS] Truddie Hidden, MD    Final Clinical Impression(s) / ED Diagnoses Final diagnoses:  Nausea and vomiting in adult  Transient alteration of awareness    Rx / DC Orders ED Discharge Orders         Ordered    ondansetron (ZOFRAN ODT) 4 MG disintegrating tablet  Every 8 hours PRN        07/08/20 0444           Truddie Hidden, MD 07/08/20 [phone]

## 2020-08-04 DIAGNOSIS — R3121 Asymptomatic microscopic hematuria: Secondary | ICD-10-CM | POA: Diagnosis not present

## 2020-08-04 DIAGNOSIS — Z8551 Personal history of malignant neoplasm of bladder: Secondary | ICD-10-CM | POA: Diagnosis not present

## 2020-08-16 DIAGNOSIS — R1030 Lower abdominal pain, unspecified: Secondary | ICD-10-CM | POA: Diagnosis not present

## 2020-08-16 DIAGNOSIS — R112 Nausea with vomiting, unspecified: Secondary | ICD-10-CM | POA: Diagnosis not present

## 2020-08-16 DIAGNOSIS — R140 Abdominal distension (gaseous): Secondary | ICD-10-CM | POA: Diagnosis not present

## 2020-08-16 DIAGNOSIS — K580 Irritable bowel syndrome with diarrhea: Secondary | ICD-10-CM | POA: Diagnosis not present

## 2020-08-18 DIAGNOSIS — K760 Fatty (change of) liver, not elsewhere classified: Secondary | ICD-10-CM | POA: Diagnosis not present

## 2020-08-18 DIAGNOSIS — R3121 Asymptomatic microscopic hematuria: Secondary | ICD-10-CM | POA: Diagnosis not present

## 2020-08-18 DIAGNOSIS — R160 Hepatomegaly, not elsewhere classified: Secondary | ICD-10-CM | POA: Diagnosis not present

## 2020-08-18 DIAGNOSIS — C679 Malignant neoplasm of bladder, unspecified: Secondary | ICD-10-CM | POA: Diagnosis not present

## 2020-09-09 DIAGNOSIS — R3121 Asymptomatic microscopic hematuria: Secondary | ICD-10-CM | POA: Diagnosis not present

## 2020-09-09 DIAGNOSIS — Z8551 Personal history of malignant neoplasm of bladder: Secondary | ICD-10-CM | POA: Diagnosis not present

## 2020-09-09 DIAGNOSIS — N3946 Mixed incontinence: Secondary | ICD-10-CM | POA: Diagnosis not present

## 2020-09-09 DIAGNOSIS — N3281 Overactive bladder: Secondary | ICD-10-CM | POA: Diagnosis not present

## 2020-11-02 DIAGNOSIS — D1801 Hemangioma of skin and subcutaneous tissue: Secondary | ICD-10-CM | POA: Diagnosis not present

## 2020-11-02 DIAGNOSIS — D2261 Melanocytic nevi of right upper limb, including shoulder: Secondary | ICD-10-CM | POA: Diagnosis not present

## 2020-11-02 DIAGNOSIS — D692 Other nonthrombocytopenic purpura: Secondary | ICD-10-CM | POA: Diagnosis not present

## 2020-11-02 DIAGNOSIS — Z85828 Personal history of other malignant neoplasm of skin: Secondary | ICD-10-CM | POA: Diagnosis not present

## 2020-11-02 DIAGNOSIS — L720 Epidermal cyst: Secondary | ICD-10-CM | POA: Diagnosis not present

## 2020-11-02 DIAGNOSIS — L821 Other seborrheic keratosis: Secondary | ICD-10-CM | POA: Diagnosis not present

## 2020-11-02 DIAGNOSIS — L738 Other specified follicular disorders: Secondary | ICD-10-CM | POA: Diagnosis not present

## 2020-11-02 DIAGNOSIS — D225 Melanocytic nevi of trunk: Secondary | ICD-10-CM | POA: Diagnosis not present

## 2020-11-30 ENCOUNTER — Ambulatory Visit: Payer: Medicare Other

## 2020-12-08 DIAGNOSIS — Z23 Encounter for immunization: Secondary | ICD-10-CM | POA: Diagnosis not present

## 2020-12-16 DIAGNOSIS — Z23 Encounter for immunization: Secondary | ICD-10-CM | POA: Diagnosis not present

## 2021-02-11 ENCOUNTER — Ambulatory Visit: Payer: Medicare Other

## 2021-02-24 DIAGNOSIS — E785 Hyperlipidemia, unspecified: Secondary | ICD-10-CM | POA: Diagnosis not present

## 2021-02-24 DIAGNOSIS — E119 Type 2 diabetes mellitus without complications: Secondary | ICD-10-CM | POA: Diagnosis not present

## 2021-02-24 DIAGNOSIS — M810 Age-related osteoporosis without current pathological fracture: Secondary | ICD-10-CM | POA: Diagnosis not present

## 2021-02-24 DIAGNOSIS — I10 Essential (primary) hypertension: Secondary | ICD-10-CM | POA: Diagnosis not present

## 2021-03-03 DIAGNOSIS — E119 Type 2 diabetes mellitus without complications: Secondary | ICD-10-CM | POA: Diagnosis not present

## 2021-03-03 DIAGNOSIS — M810 Age-related osteoporosis without current pathological fracture: Secondary | ICD-10-CM | POA: Diagnosis not present

## 2021-03-03 DIAGNOSIS — Z0000 Encounter for general adult medical examination without abnormal findings: Secondary | ICD-10-CM | POA: Diagnosis not present

## 2021-03-03 DIAGNOSIS — Z23 Encounter for immunization: Secondary | ICD-10-CM | POA: Diagnosis not present

## 2021-03-03 DIAGNOSIS — R490 Dysphonia: Secondary | ICD-10-CM | POA: Diagnosis not present

## 2021-03-03 DIAGNOSIS — R82998 Other abnormal findings in urine: Secondary | ICD-10-CM | POA: Diagnosis not present

## 2021-03-03 DIAGNOSIS — K219 Gastro-esophageal reflux disease without esophagitis: Secondary | ICD-10-CM | POA: Diagnosis not present

## 2021-03-03 DIAGNOSIS — E669 Obesity, unspecified: Secondary | ICD-10-CM | POA: Diagnosis not present

## 2021-03-03 DIAGNOSIS — I10 Essential (primary) hypertension: Secondary | ICD-10-CM | POA: Diagnosis not present

## 2021-03-03 DIAGNOSIS — E785 Hyperlipidemia, unspecified: Secondary | ICD-10-CM | POA: Diagnosis not present

## 2021-03-15 ENCOUNTER — Ambulatory Visit: Payer: Medicare Other

## 2021-03-29 ENCOUNTER — Ambulatory Visit
Admission: RE | Admit: 2021-03-29 | Discharge: 2021-03-29 | Disposition: A | Discharge status: Home / Self Care | Payer: Medicare Other | Source: Ambulatory Visit | Attending: Family Medicine | Admitting: Family Medicine

## 2021-03-29 ENCOUNTER — Other Ambulatory Visit: Payer: Self-pay | Admitting: Family Medicine

## 2021-03-29 DIAGNOSIS — K760 Fatty (change of) liver, not elsewhere classified: Secondary | ICD-10-CM | POA: Diagnosis not present

## 2021-03-29 DIAGNOSIS — K5730 Diverticulosis of large intestine without perforation or abscess without bleeding: Secondary | ICD-10-CM | POA: Diagnosis not present

## 2021-03-29 DIAGNOSIS — R109 Unspecified abdominal pain: Secondary | ICD-10-CM

## 2021-03-29 DIAGNOSIS — N390 Urinary tract infection, site not specified: Secondary | ICD-10-CM | POA: Diagnosis not present

## 2021-03-29 DIAGNOSIS — K5792 Diverticulitis of intestine, part unspecified, without perforation or abscess without bleeding: Secondary | ICD-10-CM | POA: Diagnosis not present

## 2021-03-29 DIAGNOSIS — R300 Dysuria: Secondary | ICD-10-CM | POA: Diagnosis not present

## 2021-03-29 DIAGNOSIS — R319 Hematuria, unspecified: Secondary | ICD-10-CM | POA: Diagnosis not present

## 2021-03-29 DIAGNOSIS — R1031 Right lower quadrant pain: Secondary | ICD-10-CM | POA: Diagnosis not present

## 2021-03-29 MED ORDER — IOPAMIDOL (ISOVUE-300) INJECTION 61%
100.0000 mL | Freq: Once | INTRAVENOUS | Status: AC | PRN
  Administered 2021-03-29: 100 mL via INTRAVENOUS

## 2021-04-04 DIAGNOSIS — R319 Hematuria, unspecified: Secondary | ICD-10-CM | POA: Diagnosis not present

## 2021-04-04 DIAGNOSIS — R109 Unspecified abdominal pain: Secondary | ICD-10-CM | POA: Diagnosis not present

## 2021-04-04 DIAGNOSIS — K5792 Diverticulitis of intestine, part unspecified, without perforation or abscess without bleeding: Secondary | ICD-10-CM | POA: Diagnosis not present

## 2021-04-04 DIAGNOSIS — R160 Hepatomegaly, not elsewhere classified: Secondary | ICD-10-CM | POA: Diagnosis not present

## 2021-04-04 DIAGNOSIS — N390 Urinary tract infection, site not specified: Secondary | ICD-10-CM | POA: Diagnosis not present

## 2021-04-06 ENCOUNTER — Other Ambulatory Visit: Payer: Self-pay | Admitting: Internal Medicine

## 2021-04-06 DIAGNOSIS — R160 Hepatomegaly, not elsewhere classified: Secondary | ICD-10-CM

## 2021-04-12 DIAGNOSIS — Z23 Encounter for immunization: Secondary | ICD-10-CM | POA: Diagnosis not present

## 2021-04-15 ENCOUNTER — Ambulatory Visit
Admission: RE | Admit: 2021-04-15 | Discharge: 2021-04-15 | Disposition: A | Discharge status: Home / Self Care | Payer: Medicare Other | Source: Ambulatory Visit | Attending: Internal Medicine | Admitting: Internal Medicine

## 2021-04-15 DIAGNOSIS — Z1231 Encounter for screening mammogram for malignant neoplasm of breast: Secondary | ICD-10-CM

## 2021-05-09 ENCOUNTER — Ambulatory Visit
Admission: RE | Admit: 2021-05-09 | Discharge: 2021-05-09 | Disposition: A | Discharge status: Home / Self Care | Payer: Medicare Other | Source: Ambulatory Visit | Attending: Internal Medicine | Admitting: Internal Medicine

## 2021-05-09 DIAGNOSIS — N281 Cyst of kidney, acquired: Secondary | ICD-10-CM | POA: Diagnosis not present

## 2021-05-09 DIAGNOSIS — K760 Fatty (change of) liver, not elsewhere classified: Secondary | ICD-10-CM | POA: Diagnosis not present

## 2021-05-09 DIAGNOSIS — R160 Hepatomegaly, not elsewhere classified: Secondary | ICD-10-CM

## 2021-06-28 NOTE — Progress Notes (Signed)
? ?Joy Gonzales [birth year] [id number] ? ? ?History:  81 y.o. [id number] presents for breast and pelvic exam. Postmenopausal - no HRT, no bleeding. Normal pap history. 2013 right breast cancer managed with lumpectomy and radiation. 2000 bladder cancer. History of osteoporosis, declines further screenings and treatment. History of LS managed with Clobetasol, HSV-1 managed with Valtrex as needed for outbreaks. 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  ? ?Gynecologic History ?Patient's last menstrual period was 03/13/1990. ?  ?Contraception: post menopausal status ?Sexually active: No ? ?Health Maintenance ?Last Pap: 04/08/2019. Results were: Normal ?Last mammogram: 04/15/2021. Results were: Normal ?Last colonoscopy: 2010 ?Last Dexa: 03/18/2016. Results were: T-score -3.9 at spine ? ?Past medical history, past surgical history, family history and social history were all reviewed and documented in the EPIC chart. Married. Caregiver for husband. 2 sons, 4 grandchildren.  ? ?ROS:  A ROS was performed and pertinent positives and negatives are included. ? ?Exam: ? ?Vitals:  ? 06/29/21 0945  ?BP: 124/84  ?Weight: 179 lb (81.2 kg)  ?Height: '5\' 1"'$  (1.549 m)  ? ? ?Body mass index is 33.82 kg/m?. ? ?General appearance:  Normal ?Thyroid:  Symmetrical, normal in size, without palpable masses or nodularity. ?Respiratory ? Auscultation:  Clear without wheezing or rhonchi ?Cardiovascular ? Auscultation:  Regular rate, without rubs, murmurs or gallops ? Edema/varicosities:  Not grossly evident ?Abdominal ? Soft,nontender, without masses, guarding or rebound. ? Liver/spleen:  No organomegaly noted ? Hernia:  None appreciated ? Skin ? Inspection:  Grossly normal ?Breasts: Examined lying and sitting.  ? Right: Without masses, retractions, nipple discharge or axillary adenopathy. ? ? Left: Without masses, retractions, nipple discharge or axillary adenopathy. ?Gentitourinary  ? Inguinal/mons:  Normal without inguinal  adenopathy ? External genitalia:  Thin, pearly, wrinkled appearance of vulva, perineum, and anal region with redness surrounding, consistent with LS. Fusing of posterior vestibule.  ? BUS/Urethra/Skene's glands:  Normal ? Vagina:  Normal appearing with normal color and discharge, no lesions. Atrophic changes ? Cervix:  Normal appearing without discharge or lesions ? Uterus:  Normal in size, shape and contour.  Midline and mobile, nontender ? Adnexa/parametria:   ?  Rt: Normal in size, without masses or tenderness. ?  Lt: Normal in size, without masses or tenderness. ? Anus and perineum: Thin, pearly appearance with red borders consistent with LS ? Digital rectal exam: Normal sphincter tone without palpated masses or tenderness ? ?Assessment/Plan:  81 y.o. [id number] for breast and pelvic exam.  ? ?Well female exam with routine gynecological exam - Education provided on SBEs, importance of preventative screenings, current guidelines, high calcium diet, regular exercise, and multivitamin daily. Labs with PCP.  ? ?Localized osteoporosis without current pathological fracture - 2018 T-score - 3.9 at spine. She is currently on high dose Vitamin D twice per week prescribed by PCP. Recommend regular exercise and resistance training. She declines further screenings and treatment.  ? ?Postmenopausal - no HRT, no bleeding ? ?Lichen sclerosus - Plan: betamethasone valerate ointment (VALISONE) 0.1 % twice weekly. She has not been using due to cost of medication. Will switch to Betamethasone. Recommend using consistently.  ? ?HSV-1 (herpes simplex virus 1) infection - Plan: valACYclovir (VALTREX) 500 MG tablet as needed for outbreaks. Rare outbreaks.  ? ?Screening for cervical cancer - Normal Pap history.  No longer screening per guidelines.  ? ?Screening for breast cancer - Normal mammogram history.  Continue annual screenings.  Normal breast exam today. ? ?Screening for colon cancer - 2010 colonoscopy. Screenings no  longer  recommended per GI.  ? ?Return in 2 years for breast and pelvic exam. She is aware of "high risk" status due to HSV but would prefer coming every other year and I am agreeable.  ? ? ? ? ? ?[name] DNP, 10:01 AM 06/29/2021 ? ?

## 2021-06-29 ENCOUNTER — Ambulatory Visit (INDEPENDENT_AMBULATORY_CARE_PROVIDER_SITE_OTHER): Payer: Medicare Other | Admitting: Nurse Practitioner

## 2021-06-29 ENCOUNTER — Encounter: Payer: Self-pay | Admitting: Nurse Practitioner

## 2021-06-29 VITALS — BP 124/84 | Ht 61.0 in | Wt 179.0 lb

## 2021-06-29 DIAGNOSIS — B009 Herpesviral infection, unspecified: Secondary | ICD-10-CM | POA: Diagnosis not present

## 2021-06-29 DIAGNOSIS — Z780 Asymptomatic menopausal state: Secondary | ICD-10-CM

## 2021-06-29 DIAGNOSIS — L900 Lichen sclerosus et atrophicus: Secondary | ICD-10-CM | POA: Diagnosis not present

## 2021-06-29 DIAGNOSIS — M816 Localized osteoporosis [Lequesne]: Secondary | ICD-10-CM | POA: Diagnosis not present

## 2021-06-29 DIAGNOSIS — Z9189 Other specified personal risk factors, not elsewhere classified: Secondary | ICD-10-CM

## 2021-06-29 DIAGNOSIS — Z01419 Encounter for gynecological examination (general) (routine) without abnormal findings: Secondary | ICD-10-CM

## 2021-06-29 MED ORDER — VALACYCLOVIR HCL 500 MG PO TABS: ORAL_TABLET | ORAL | 1 refills | Status: DC

## 2021-06-29 MED ORDER — BETAMETHASONE VALERATE 0.1 % EX OINT: 1.0000 "application " | TOPICAL_OINTMENT | CUTANEOUS | 0 refills | Status: DC

## 2021-07-19 ENCOUNTER — Other Ambulatory Visit: Payer: Self-pay | Admitting: Nurse Practitioner

## 2021-07-19 DIAGNOSIS — B009 Herpesviral infection, unspecified: Secondary | ICD-10-CM

## 2021-09-22 DIAGNOSIS — R1084 Generalized abdominal pain: Secondary | ICD-10-CM | POA: Diagnosis not present

## 2021-09-22 DIAGNOSIS — Z8551 Personal history of malignant neoplasm of bladder: Secondary | ICD-10-CM | POA: Diagnosis not present

## 2021-09-22 DIAGNOSIS — R3121 Asymptomatic microscopic hematuria: Secondary | ICD-10-CM | POA: Diagnosis not present

## 2021-10-06 DIAGNOSIS — N281 Cyst of kidney, acquired: Secondary | ICD-10-CM | POA: Diagnosis not present

## 2021-10-06 DIAGNOSIS — R3121 Asymptomatic microscopic hematuria: Secondary | ICD-10-CM | POA: Diagnosis not present

## 2021-10-06 DIAGNOSIS — K5730 Diverticulosis of large intestine without perforation or abscess without bleeding: Secondary | ICD-10-CM | POA: Diagnosis not present

## 2021-12-09 ENCOUNTER — Other Ambulatory Visit: Payer: Self-pay | Admitting: Nurse Practitioner

## 2021-12-09 DIAGNOSIS — L900 Lichen sclerosus et atrophicus: Secondary | ICD-10-CM

## 2021-12-09 NOTE — Telephone Encounter (Signed)
Last annual exam was 05/7503  Cream is for Lichen sclerosus

## 2021-12-22 DIAGNOSIS — Z23 Encounter for immunization: Secondary | ICD-10-CM | POA: Diagnosis not present

## 2022-01-09 DIAGNOSIS — Z23 Encounter for immunization: Secondary | ICD-10-CM | POA: Diagnosis not present

## 2022-03-15 ENCOUNTER — Other Ambulatory Visit: Payer: Self-pay | Admitting: Internal Medicine

## 2022-03-15 DIAGNOSIS — Z1231 Encounter for screening mammogram for malignant neoplasm of breast: Secondary | ICD-10-CM

## 2022-03-24 DIAGNOSIS — H04123 Dry eye syndrome of bilateral lacrimal glands: Secondary | ICD-10-CM | POA: Diagnosis not present

## 2022-03-24 DIAGNOSIS — H2513 Age-related nuclear cataract, bilateral: Secondary | ICD-10-CM | POA: Diagnosis not present

## 2022-03-24 DIAGNOSIS — H524 Presbyopia: Secondary | ICD-10-CM | POA: Diagnosis not present

## 2022-03-24 DIAGNOSIS — E119 Type 2 diabetes mellitus without complications: Secondary | ICD-10-CM | POA: Diagnosis not present

## 2022-03-24 DIAGNOSIS — H5213 Myopia, bilateral: Secondary | ICD-10-CM | POA: Diagnosis not present

## 2022-03-24 DIAGNOSIS — H25013 Cortical age-related cataract, bilateral: Secondary | ICD-10-CM | POA: Diagnosis not present

## 2022-04-24 DIAGNOSIS — I10 Essential (primary) hypertension: Secondary | ICD-10-CM | POA: Diagnosis not present

## 2022-04-24 DIAGNOSIS — R7989 Other specified abnormal findings of blood chemistry: Secondary | ICD-10-CM | POA: Diagnosis not present

## 2022-04-24 DIAGNOSIS — M810 Age-related osteoporosis without current pathological fracture: Secondary | ICD-10-CM | POA: Diagnosis not present

## 2022-04-24 DIAGNOSIS — E785 Hyperlipidemia, unspecified: Secondary | ICD-10-CM | POA: Diagnosis not present

## 2022-04-24 DIAGNOSIS — K219 Gastro-esophageal reflux disease without esophagitis: Secondary | ICD-10-CM | POA: Diagnosis not present

## 2022-05-01 DIAGNOSIS — M810 Age-related osteoporosis without current pathological fracture: Secondary | ICD-10-CM | POA: Diagnosis not present

## 2022-05-01 DIAGNOSIS — Z1339 Encounter for screening examination for other mental health and behavioral disorders: Secondary | ICD-10-CM | POA: Diagnosis not present

## 2022-05-01 DIAGNOSIS — K219 Gastro-esophageal reflux disease without esophagitis: Secondary | ICD-10-CM | POA: Diagnosis not present

## 2022-05-01 DIAGNOSIS — Z0000 Encounter for general adult medical examination without abnormal findings: Secondary | ICD-10-CM | POA: Diagnosis not present

## 2022-05-01 DIAGNOSIS — E785 Hyperlipidemia, unspecified: Secondary | ICD-10-CM | POA: Diagnosis not present

## 2022-05-01 DIAGNOSIS — Z1331 Encounter for screening for depression: Secondary | ICD-10-CM | POA: Diagnosis not present

## 2022-05-01 DIAGNOSIS — N390 Urinary tract infection, site not specified: Secondary | ICD-10-CM | POA: Diagnosis not present

## 2022-05-01 DIAGNOSIS — I10 Essential (primary) hypertension: Secondary | ICD-10-CM | POA: Diagnosis not present

## 2022-05-01 DIAGNOSIS — E119 Type 2 diabetes mellitus without complications: Secondary | ICD-10-CM | POA: Diagnosis not present

## 2022-05-02 DIAGNOSIS — M21622 Bunionette of left foot: Secondary | ICD-10-CM | POA: Diagnosis not present

## 2022-05-02 DIAGNOSIS — M79675 Pain in left toe(s): Secondary | ICD-10-CM | POA: Diagnosis not present

## 2022-05-02 DIAGNOSIS — D2372 Other benign neoplasm of skin of left lower limb, including hip: Secondary | ICD-10-CM | POA: Diagnosis not present

## 2022-05-02 DIAGNOSIS — M2041 Other hammer toe(s) (acquired), right foot: Secondary | ICD-10-CM | POA: Diagnosis not present

## 2022-05-09 ENCOUNTER — Ambulatory Visit: Payer: Medicare Other

## 2022-06-20 ENCOUNTER — Ambulatory Visit
Admission: RE | Admit: 2022-06-20 | Discharge: 2022-06-20 | Disposition: A | Discharge status: Home / Self Care | Payer: Medicare Other | Source: Ambulatory Visit | Attending: Internal Medicine | Admitting: Internal Medicine

## 2022-06-20 DIAGNOSIS — Z1231 Encounter for screening mammogram for malignant neoplasm of breast: Secondary | ICD-10-CM | POA: Diagnosis not present

## 2022-06-22 ENCOUNTER — Other Ambulatory Visit: Payer: Self-pay | Admitting: Internal Medicine

## 2022-06-22 DIAGNOSIS — R928 Other abnormal and inconclusive findings on diagnostic imaging of breast: Secondary | ICD-10-CM

## 2022-07-05 ENCOUNTER — Ambulatory Visit
Admission: RE | Admit: 2022-07-05 | Discharge: 2022-07-05 | Disposition: A | Discharge status: Home / Self Care | Payer: Medicare Other | Source: Ambulatory Visit | Attending: Internal Medicine | Admitting: Internal Medicine

## 2022-07-05 DIAGNOSIS — R928 Other abnormal and inconclusive findings on diagnostic imaging of breast: Secondary | ICD-10-CM

## 2022-07-05 DIAGNOSIS — R92322 Mammographic fibroglandular density, left breast: Secondary | ICD-10-CM | POA: Diagnosis not present

## 2022-07-05 DIAGNOSIS — N6002 Solitary cyst of left breast: Secondary | ICD-10-CM | POA: Diagnosis not present

## 2022-07-07 ENCOUNTER — Other Ambulatory Visit: Payer: Self-pay | Admitting: Nurse Practitioner

## 2022-07-07 DIAGNOSIS — B009 Herpesviral infection, unspecified: Secondary | ICD-10-CM

## 2022-07-07 NOTE — Telephone Encounter (Signed)
RF request received for Vltrex 500mg .  Last AEX 06/29/21.  No appointment is scheduled.  Ok'd #[phone] with note that patient needs office visit for further refills.

## 2022-07-18 ENCOUNTER — Other Ambulatory Visit: Payer: Self-pay | Admitting: Nurse Practitioner

## 2022-07-18 DIAGNOSIS — B009 Herpesviral infection, unspecified: Secondary | ICD-10-CM

## 2022-07-18 NOTE — Telephone Encounter (Signed)
Pharmacy comment: REQUEST FOR 90 DAYS PRESCRIPTION.  DX Code Needed.   (I added ICD 10 code to Rx note.)

## 2022-07-20 DIAGNOSIS — H25011 Cortical age-related cataract, right eye: Secondary | ICD-10-CM | POA: Diagnosis not present

## 2022-07-20 DIAGNOSIS — H2511 Age-related nuclear cataract, right eye: Secondary | ICD-10-CM | POA: Diagnosis not present

## 2022-07-20 DIAGNOSIS — H25811 Combined forms of age-related cataract, right eye: Secondary | ICD-10-CM | POA: Diagnosis not present

## 2022-08-24 DIAGNOSIS — H2512 Age-related nuclear cataract, left eye: Secondary | ICD-10-CM | POA: Diagnosis not present

## 2022-08-24 DIAGNOSIS — H25812 Combined forms of age-related cataract, left eye: Secondary | ICD-10-CM | POA: Diagnosis not present

## 2022-08-24 DIAGNOSIS — H25012 Cortical age-related cataract, left eye: Secondary | ICD-10-CM | POA: Diagnosis not present

## 2022-09-26 DIAGNOSIS — Z8551 Personal history of malignant neoplasm of bladder: Secondary | ICD-10-CM | POA: Diagnosis not present

## 2022-10-17 ENCOUNTER — Other Ambulatory Visit: Payer: Self-pay | Admitting: Nurse Practitioner

## 2022-10-17 DIAGNOSIS — B009 Herpesviral infection, unspecified: Secondary | ICD-10-CM

## 2022-10-17 NOTE — Telephone Encounter (Signed)
Med refill request: Valtrex Last AEX: 06/29/21 Next AEX: not scheduled Last MMG (if hormonal med) n/a Refill authorized: Please Advise, #[phone], 0RF, left msg for pt to schedule

## 2022-11-10 DIAGNOSIS — Z23 Encounter for immunization: Secondary | ICD-10-CM | POA: Diagnosis not present

## 2022-11-20 DIAGNOSIS — I10 Essential (primary) hypertension: Secondary | ICD-10-CM | POA: Diagnosis not present

## 2022-11-20 DIAGNOSIS — E785 Hyperlipidemia, unspecified: Secondary | ICD-10-CM | POA: Diagnosis not present

## 2022-11-20 DIAGNOSIS — E119 Type 2 diabetes mellitus without complications: Secondary | ICD-10-CM | POA: Diagnosis not present

## 2022-11-20 DIAGNOSIS — R6889 Other general symptoms and signs: Secondary | ICD-10-CM | POA: Diagnosis not present

## 2022-11-20 DIAGNOSIS — R232 Flushing: Secondary | ICD-10-CM | POA: Diagnosis not present

## 2022-11-23 DIAGNOSIS — Z23 Encounter for immunization: Secondary | ICD-10-CM | POA: Diagnosis not present

## 2022-11-30 DIAGNOSIS — L570 Actinic keratosis: Secondary | ICD-10-CM | POA: Diagnosis not present

## 2022-11-30 DIAGNOSIS — L738 Other specified follicular disorders: Secondary | ICD-10-CM | POA: Diagnosis not present

## 2022-11-30 DIAGNOSIS — L720 Epidermal cyst: Secondary | ICD-10-CM | POA: Diagnosis not present

## 2022-11-30 DIAGNOSIS — L821 Other seborrheic keratosis: Secondary | ICD-10-CM | POA: Diagnosis not present

## 2022-11-30 DIAGNOSIS — Z85828 Personal history of other malignant neoplasm of skin: Secondary | ICD-10-CM | POA: Diagnosis not present

## 2022-11-30 DIAGNOSIS — L820 Inflamed seborrheic keratosis: Secondary | ICD-10-CM | POA: Diagnosis not present

## 2022-11-30 DIAGNOSIS — D225 Melanocytic nevi of trunk: Secondary | ICD-10-CM | POA: Diagnosis not present

## 2022-11-30 DIAGNOSIS — D1801 Hemangioma of skin and subcutaneous tissue: Secondary | ICD-10-CM | POA: Diagnosis not present

## 2022-12-08 ENCOUNTER — Other Ambulatory Visit: Payer: Self-pay | Admitting: Nurse Practitioner

## 2022-12-08 DIAGNOSIS — L900 Lichen sclerosus et atrophicus: Secondary | ICD-10-CM

## 2022-12-11 NOTE — Telephone Encounter (Signed)
Med refill request: Valisone Ointment Last AEX: 06/29/2021 Next AEX: recall placed for 06/2023 for B&P, nothing currently scheduled Last MMG (if hormonal med): n/a Refill authorized: rx pend.

## 2023-02-21 NOTE — Progress Notes (Unsigned)
Cardiology Office Note:    Date:  02/26/2023   ID:  Joy Gonzales, DOB 02/22/1941, MRN [id number]  PCP:  Garlan Fillers, MD   [facility] Providers Cardiologist:  None     Referring MD: Garlan Fillers, MD   Chief Complaint  Patient presents with   Shortness of Breath    History of Present Illness:    Joy Gonzales is a 82 y.o. female seen at the request of Dr Eloise Harman for evaluation of elevated calcium score. I had seen her remotely in 2012 for labile HTN. She has a history of HLD. Prior evaluation at [facility] cardiology in 2020 included a Myoview that was normal. Echo showed mild MR and TR but was otherwise normal. She does report symptoms of SOB with activity. Low key. Is living at a retirement center and does some exercise 3x/week. No swelling. She has obesity but weight has not changed. No palpitations. She tells me her dyspnea has not changed in 8 years. She had a recent coronary calcium score done which was 57. This placed her at 28th percentile for age.   Past Medical History:  Diagnosis Date   Atrophic vaginitis    Bladder cancer (HCC)    Breast cancer (HCC)    Diverticulosis    Dyspnea    on exertion   Elevated LFTs    Flushing    GERD (gastroesophageal reflux disease)    HSV-1 infection    HTN (hypertension)    Hyperlipemia    IBS (irritable bowel syndrome)    Lichen sclerosus    Osteopenia    Osteoporosis 2018   Personal history of radiation therapy 2013   PONV (postoperative nausea and vomiting)    Rectocele    S/P radiation therapy 08/24/11 - 10/05/11   Right Breast: 50 Gy/25 Fractions with boost of 10Gy/5 Fractions   Tinnitus    Use of anastrozole (Arimidex) 10/25/11   Vitamin D deficiency    Vocal cord granuloma     Past Surgical History:  Procedure Laterality Date   BLADDER TUMOR EXCISION     BREAST LUMPECTOMY Right 07/05/2011   Right Breast- Invasive Ductal , Grade III, 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 or Thursday, March 14 7:30am? or Tuesday March 19 7:30am?   Needle Core Biospy  06/20/2011   Right Breast, 12'Oclock - Invasive Mammary Carcinoma with Lymphovascular Invasion: ER/PR 100%, Ki67 50%   THROAT SURGERY     LARYN. GRANULOMA  POLYP   TONSILLECTOMY AND ADENOIDECTOMY      Current Medications: Current Meds  Medication Sig   ALPRAZolam (XANAX) 0.25 MG tablet Take 0.125 mg by mouth daily as needed. Take infrequently   aspirin 81 MG tablet Take 81 mg by mouth daily.   betamethasone valerate ointment (VALISONE) 0.1 % APPLY TO AFFECTED AREA TWICE A WEEK   esomeprazole (NEXIUM) 40 MG capsule Take 40 mg by mouth daily.   fenofibrate micronized (LOFIBRA) 200 MG capsule Take 200 mg by mouth daily.   fexofenadine (ALLEGRA) 180 MG tablet Take 180 mg by mouth as needed.  metoprolol succinate (TOPROL-XL) 25 MG 24 hr tablet Take 25 mg by mouth daily.   olmesartan-hydrochlorothiazide (BENICAR HCT) 20-12.5 MG per tablet Take 1 tablet by mouth daily.   ondansetron (ZOFRAN ODT) 4 MG disintegrating tablet Take 1 tablet (4 mg total) by mouth every 8 (eight) hours as needed for nausea or vomiting.   valACYclovir (VALTREX) 500 MG tablet TAKE 1 TABLET BY MOUTH TWICE A DAY FOR 3-5 DAYS AS NEEDED   Vitamin D, Ergocalciferol, (DRISDOL) 50000 units CAPS capsule Take 1 capsule (50,000 Units total) by mouth every 7 (seven) days. (Patient taking differently: Take 50,000 Units by mouth. Two times a week)     Allergies:   Other, Adhesive [tape], and Felodipine er   Social History   Socioeconomic History   Marital status: Married    Spouse name: Not on file   Number of children: 2    Years of education: Not on file   Highest education level: Not on file  Occupational History   Occupation: homemaker  Tobacco Use   Smoking status: Never   Smokeless tobacco: Never  Vaping Use   Vaping status: Never Used  Substance and Sexual Activity   Alcohol use: Yes    Comment: very rare   Drug use: No   Sexual activity: Not Currently    Birth control/protection: Post-menopausal    Comment: 1st intercourse 23 yo-1 partner  Other Topics Concern   Not on file  Social History Narrative   Not on file   Social Drivers of Health   Financial Resource Strain: Not on file  Food Insecurity: Not on file  Transportation Needs: Not on file  Physical Activity: Not on file  Stress: Not on file  Social Connections: Not on file     Family History: The patient's family history includes Barrett's esophagus in her brother; Brain cancer in her cousin; Breast cancer in her cousin and maternal aunt; Breast cancer (age of onset: 59) in an other family member; Breast cancer (age of onset: 50) in her maternal grandmother; Coronary artery disease in her father; Crohn's disease in her son; Diabetes in her sister; Esophageal cancer (age of onset: 53) in her father; Heart disease in her father; Heart failure in her father; Hypertension in her brother, father, sister, and sister; Kidney cancer (age of onset: 55) in her sister; Lung cancer in her paternal uncle; Lung cancer (age of onset: 12) in an other family member; Stomach cancer in her paternal uncle; Stroke in her sister.  ROS:   Please see the history of present illness.     All other systems reviewed and are negative.  EKGs/Labs/Other Studies Reviewed:    The following studies were reviewed today: Echocardiogram 11/28/2018:  Left ventricle cavity is normal in size. Mild concentric hypertrophy of  the left ventricle. Normal LV systolic function with EF 56%. Normal global  wall motion. Doppler evidence of grade I (impaired) diastolic  dysfunction,  normal LAP.  Right atrial cavity is slightly dilated.  Mild (Grade I) mitral regurgitation.  Mild tricuspid regurgitation. Estimated pulmonary artery systolic pressure  is 32 mmHg.   Lexiscan Myoview Stress Test 12/09/2018: Resting EKG normal sinus rhythm, RBBB.  Stress EKG non-diagnostic due to pharmacologic stress testing.  Myocardial perfusion imaging is normal. Left ventricular ejection fraction is  64% with normal wall motion. Low risk study. No previous exam available for comparison. EKG Interpretation Date/Time:  Monday February 26 2023 08:33:33 EST Ventricular Rate:  71 PR Interval:  140 QRS Duration:  106  QT Interval:  414 QTC Calculation: 449 R Axis:   52  Text Interpretation: Normal sinus rhythm Low voltage QRS Incomplete right bundle branch block Nonspecific ST and T wave abnormality When compared with ECG of 17-May-2011 10:20, Incomplete right bundle branch block has replaced Right bundle branch block Confirmed by [place], Deyra Perdomo [id number]) on 02/26/2023 8:35:21 AM    Recent Labs: No results found for requested labs within last 365 days.  Recent Lipid Panel No results found for: "CHOL", "TRIG", "HDL", "CHOLHDL", "VLDL", "LDLCALC", "LDLDIRECT" Dated 02/24/21: cholesterol 144, triglycerides 117, HDL 33, LDL 88.  Dated 11/20/22 BMET normal. CBC and CMET normal  EKG Interpretation Date/Time:  Monday February 26 2023 08:33:33 EST Ventricular Rate:  71 PR Interval:  140 QRS Duration:  106 QT Interval:  414 QTC Calculation: 449 R Axis:   52  Text Interpretation: Normal sinus rhythm Low voltage QRS Incomplete right bundle branch block Nonspecific ST and T wave abnormality When compared with ECG of 17-May-2011 10:20, Incomplete right bundle branch block has replaced Right bundle branch block Confirmed by [place], Lavontay Kirk [phone]) on 02/26/2023 8:35:21 AM    Risk Assessment/Calculations:                Physical Exam:    VS:  BP 118/82 (BP Location: Left Arm,  Patient Position: Sitting, Cuff Size: Normal)   Pulse 71   Ht 5\' 2"  (1.575 m)   Wt 184 lb (83.5 kg)   LMP 03/13/1990   SpO2 96%   BMI 33.65 kg/m     Wt Readings from Last 3 Encounters:  02/26/23 184 lb (83.5 kg)  06/29/21 179 lb (81.2 kg)  07/08/20 183 lb (83 kg)     GEN:  Well nourished, obese in no acute distress HEENT: Normal NECK: No JVD; No carotid bruits LYMPHATICS: No lymphadenopathy CARDIAC: RRR, no murmurs, rubs, gallops RESPIRATORY:  Clear to auscultation without rales, wheezing or rhonchi  ABDOMEN: Soft, non-tender, non-distended MUSCULOSKELETAL:  No edema; No deformity  SKIN: Warm and dry NEUROLOGIC:  Alert and oriented x 3 PSYCHIATRIC:  Normal affect   ASSESSMENT:    1. Dyspnea on exertion   2. 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 Coronary artery calcification   4. Primary hypertension    PLAN:    In order of problems listed above:  Coronary artery calcium. Score is actually quite low for her age. She has no active angina. Chronic DOE likely related to age, obesity and deconditioning. Recommend continue risk factor modification with good BP and cholesterol control. ASA 81 mg daily. No further cardiac work up needed unless there is a change in her symptoms DOE chronic secondary to obesity and deconditioning. Negative Myoview and Echo in 2020 and she had these symptoms then HTN controlled. Obesity.           Medication Adjustments/Labs and Tests Ordered: Current medicines are reviewed at length with the patient today.  Concerns regarding medicines are outlined above.  Orders Placed This Encounter  Procedures   EKG 12-Lead   No orders of the defined types were placed in this encounter.   There are no Patient Instructions on file for this visit.   Signed, Cali Cuartas [place], MD  02/26/2023 8:51 AM    [facility]

## 2023-02-26 ENCOUNTER — Encounter: Payer: Self-pay | Admitting: Cardiology

## 2023-02-26 ENCOUNTER — Ambulatory Visit: Payer: Medicare Other | Attending: Cardiology | Admitting: Cardiology

## 2023-02-26 VITALS — BP 118/82 | HR 71 | Ht 62.0 in | Wt 184.0 lb

## 2023-02-26 DIAGNOSIS — C50411 Malignant neoplasm of upper-outer quadrant of right female breast: Secondary | ICD-10-CM

## 2023-02-26 DIAGNOSIS — I2510 Atherosclerotic heart disease of native coronary artery without angina pectoris: Secondary | ICD-10-CM

## 2023-02-26 DIAGNOSIS — I10 Essential (primary) hypertension: Secondary | ICD-10-CM | POA: Diagnosis not present

## 2023-02-26 DIAGNOSIS — R0609 Other forms of dyspnea: Secondary | ICD-10-CM

## 2023-02-26 NOTE — Patient Instructions (Signed)
 Medication Instructions:  Continue same medications *If you need a refill on your cardiac medications before your next appointment, please call your pharmacy*   Lab Work: None ordered   Testing/Procedures: None ordered   Follow-Up: At [facility], you and your health needs are our priority.  As part of our continuing mission to provide you with exceptional heart care, we have created designated Provider Care Teams.  These Care Teams include your primary Cardiologist (physician) and Advanced Practice Providers (APPs -  Physician Assistants and Nurse Practitioners) who all work together to provide you with the care you need, when you need it.  We recommend signing up for the patient portal called "MyChart".  Sign up information is provided on this After Visit Summary.  MyChart is used to connect with patients for Virtual Visits (Telemedicine).  Patients are able to view lab/test results, encounter notes, upcoming appointments, etc.  Non-urgent messages can be sent to your provider as well.   To learn more about what you can do with MyChart, go to [email or web address].    Your next appointment:  As Needed    Provider:  Dr.Jordan

## 2023-04-16 ENCOUNTER — Other Ambulatory Visit: Payer: Self-pay | Admitting: Nurse Practitioner

## 2023-04-16 DIAGNOSIS — B009 Herpesviral infection, unspecified: Secondary | ICD-10-CM

## 2023-04-16 NOTE — Telephone Encounter (Signed)
Med refill request: VALTREX Last AEX: 06/29/2021-TW Next AEX: recall placed for 06/2023. Last MMG (if hormonal med): n/a Refill authorized: rx pend.

## 2023-05-07 DIAGNOSIS — L570 Actinic keratosis: Secondary | ICD-10-CM | POA: Diagnosis not present

## 2023-05-07 DIAGNOSIS — L738 Other specified follicular disorders: Secondary | ICD-10-CM | POA: Diagnosis not present

## 2023-05-07 DIAGNOSIS — L821 Other seborrheic keratosis: Secondary | ICD-10-CM | POA: Diagnosis not present

## 2023-05-07 DIAGNOSIS — L720 Epidermal cyst: Secondary | ICD-10-CM | POA: Diagnosis not present

## 2023-05-07 DIAGNOSIS — D2261 Melanocytic nevi of right upper limb, including shoulder: Secondary | ICD-10-CM | POA: Diagnosis not present

## 2023-05-07 DIAGNOSIS — D1801 Hemangioma of skin and subcutaneous tissue: Secondary | ICD-10-CM | POA: Diagnosis not present

## 2023-05-07 DIAGNOSIS — Z85828 Personal history of other malignant neoplasm of skin: Secondary | ICD-10-CM | POA: Diagnosis not present

## 2023-05-07 DIAGNOSIS — L814 Other melanin hyperpigmentation: Secondary | ICD-10-CM | POA: Diagnosis not present

## 2023-05-15 DIAGNOSIS — I10 Essential (primary) hypertension: Secondary | ICD-10-CM | POA: Diagnosis not present

## 2023-05-15 DIAGNOSIS — E119 Type 2 diabetes mellitus without complications: Secondary | ICD-10-CM | POA: Diagnosis not present

## 2023-05-15 DIAGNOSIS — M810 Age-related osteoporosis without current pathological fracture: Secondary | ICD-10-CM | POA: Diagnosis not present

## 2023-05-15 DIAGNOSIS — E785 Hyperlipidemia, unspecified: Secondary | ICD-10-CM | POA: Diagnosis not present

## 2023-05-21 ENCOUNTER — Other Ambulatory Visit: Payer: Self-pay | Admitting: Internal Medicine

## 2023-05-21 DIAGNOSIS — Z1231 Encounter for screening mammogram for malignant neoplasm of breast: Secondary | ICD-10-CM

## 2023-05-22 DIAGNOSIS — H6123 Impacted cerumen, bilateral: Secondary | ICD-10-CM | POA: Diagnosis not present

## 2023-05-22 DIAGNOSIS — R82998 Other abnormal findings in urine: Secondary | ICD-10-CM | POA: Diagnosis not present

## 2023-05-22 DIAGNOSIS — I10 Essential (primary) hypertension: Secondary | ICD-10-CM | POA: Diagnosis not present

## 2023-05-22 DIAGNOSIS — E119 Type 2 diabetes mellitus without complications: Secondary | ICD-10-CM | POA: Diagnosis not present

## 2023-05-22 DIAGNOSIS — M810 Age-related osteoporosis without current pathological fracture: Secondary | ICD-10-CM | POA: Diagnosis not present

## 2023-05-22 DIAGNOSIS — G4700 Insomnia, unspecified: Secondary | ICD-10-CM | POA: Diagnosis not present

## 2023-05-22 DIAGNOSIS — E785 Hyperlipidemia, unspecified: Secondary | ICD-10-CM | POA: Diagnosis not present

## 2023-05-22 DIAGNOSIS — Z0000 Encounter for general adult medical examination without abnormal findings: Secondary | ICD-10-CM | POA: Diagnosis not present

## 2023-06-05 DIAGNOSIS — H43812 Vitreous degeneration, left eye: Secondary | ICD-10-CM | POA: Diagnosis not present

## 2023-06-05 DIAGNOSIS — H04123 Dry eye syndrome of bilateral lacrimal glands: Secondary | ICD-10-CM | POA: Diagnosis not present

## 2023-06-05 DIAGNOSIS — H5310 Unspecified subjective visual disturbances: Secondary | ICD-10-CM | POA: Diagnosis not present

## 2023-06-06 DIAGNOSIS — H6123 Impacted cerumen, bilateral: Secondary | ICD-10-CM | POA: Diagnosis not present

## 2023-06-06 DIAGNOSIS — H9193 Unspecified hearing loss, bilateral: Secondary | ICD-10-CM | POA: Diagnosis not present

## 2023-07-18 ENCOUNTER — Ambulatory Visit
Admission: RE | Admit: 2023-07-18 | Discharge: 2023-07-18 | Disposition: A | Discharge status: Home / Self Care | Source: Ambulatory Visit | Attending: Internal Medicine | Admitting: Internal Medicine

## 2023-07-18 DIAGNOSIS — Z1231 Encounter for screening mammogram for malignant neoplasm of breast: Secondary | ICD-10-CM

## 2023-08-15 ENCOUNTER — Ambulatory Visit: Payer: Medicare Other | Admitting: Nurse Practitioner

## 2023-08-29 ENCOUNTER — Ambulatory Visit (INDEPENDENT_AMBULATORY_CARE_PROVIDER_SITE_OTHER): Admitting: Nurse Practitioner

## 2023-08-29 ENCOUNTER — Encounter: Payer: Self-pay | Admitting: Nurse Practitioner

## 2023-08-29 VITALS — BP 118/64 | HR 74 | Temp 99.0°F | Ht 60.75 in | Wt 182.0 lb

## 2023-08-29 DIAGNOSIS — R350 Frequency of micturition: Secondary | ICD-10-CM | POA: Diagnosis not present

## 2023-08-29 DIAGNOSIS — Z01419 Encounter for gynecological examination (general) (routine) without abnormal findings: Secondary | ICD-10-CM

## 2023-08-29 DIAGNOSIS — L900 Lichen sclerosus et atrophicus: Secondary | ICD-10-CM

## 2023-08-29 DIAGNOSIS — B009 Herpesviral infection, unspecified: Secondary | ICD-10-CM | POA: Diagnosis not present

## 2023-08-29 DIAGNOSIS — Z9289 Personal history of other medical treatment: Secondary | ICD-10-CM | POA: Diagnosis not present

## 2023-08-29 DIAGNOSIS — Z9189 Other specified personal risk factors, not elsewhere classified: Secondary | ICD-10-CM

## 2023-08-29 DIAGNOSIS — M816 Localized osteoporosis [Lequesne]: Secondary | ICD-10-CM | POA: Diagnosis not present

## 2023-08-29 DIAGNOSIS — Z780 Asymptomatic menopausal state: Secondary | ICD-10-CM | POA: Diagnosis not present

## 2023-08-29 MED ORDER — BETAMETHASONE VALERATE 0.1 % EX OINT: TOPICAL_OINTMENT | CUTANEOUS | 2 refills | Status: AC

## 2023-08-29 NOTE — Progress Notes (Signed)
 Joy Gonzales March 27, 1985 [id number]   History:  83 y.o. G2P2002 presents for breast and pelvic exam. Complains of urinary frequency x 1 day. Postmenopausal - no HRT, no bleeding. Normal pap history. 2013 right breast cancer managed with lumpectomy and radiation. 2000 bladder cancer. History of osteoporosis, declines further screenings and treatment. History of LS managed with Clobetasol , uses 2-4 times per week. HSV-1 managed with Valtrex  as needed for outbreaks.   Gynecologic History Patient's last menstrual period was 03/13/1990.   Contraception: post menopausal status Sexually active: No  Health Maintenance Last Pap: 04/08/2019. Results were: Normal Last mammogram: 07/18/2023. Results were: Normal Last colonoscopy: 2010 Last Dexa: 03/18/2016. Results were: T-score -3.9 at spine  Past medical history, past surgical history, family history and social history were all reviewed and documented in the EPIC chart. Widowed. Husband passed last year. Moved into retirement home.  2 sons, 4 grandchildren.   ROS:  A ROS was performed and pertinent positives and negatives are included.  Exam:  Vitals:   08/29/23 1021  BP: 118/64  Pulse: 74  Temp: 99 F (37.2 C)  TempSrc: Oral  SpO2: 99%  Weight: 182 lb (82.6 kg)  Height: 5' 0.75 (1.543 m)     Body mass index is 34.67 kg/m.  General appearance:  Normal Thyroid :  Symmetrical, normal in size, without palpable masses or nodularity. Respiratory  Auscultation:  Clear without wheezing or rhonchi Cardiovascular  Auscultation:  Regular rate, without rubs, murmurs or gallops  Edema/varicosities:  Not grossly evident Abdominal  Soft,nontender, without masses, guarding or rebound.  Liver/spleen:  No organomegaly noted  Hernia:  None appreciated  Skin  Inspection:  Grossly normal Breasts: Examined lying and sitting.   Right: Without masses, retractions, nipple discharge or axillary adenopathy.   Left: Without masses, retractions, nipple  discharge or axillary adenopathy. Gentitourinary   Inguinal/mons:  Normal without inguinal adenopathy  External genitalia:  Thin, pearly, wrinkled appearance of vulva, perineum, and anal region with redness surrounding, consistent with LS. Fusing of posterior vestibule.   BUS/Urethra/Skene's glands:  Normal  Vagina:  Normal appearing with normal color and discharge, no lesions. Atrophic changes  Cervix:  Normal appearing without discharge or lesions  Uterus:  Normal in size, shape and contour.  Midline and mobile, nontender  Adnexa/parametria:     Rt: Normal in size, without masses or tenderness.   Lt: Normal in size, without masses or tenderness.  Anus and perineum: Thin, pearly appearance with red borders consistent with LS  Digital rectal exam: Not indicated  Joy Gonzales, CMA present as chaperone.   UA: 1+ leukocytes, neg nitrites, trace blood, yellow/cloudy. Microscopic: wbc 6-10, rbc 0-2, moderate bacteria, 0-1 hyaline casts  Assessment/Plan:  83 y.o. [id number] for breast and pelvic exam.   Encounter for breast and pelvic examination - Education provided on SBEs, importance of preventative screenings, current guidelines, high calcium diet, regular exercise, and multivitamin daily. Labs with PCP.   Localized osteoporosis without current pathological fracture - 2018 T-score - 3.9 at spine. She declines further screenings and treatment. Does exercise classes 5 days a week.   Postmenopausal - no HRT, no bleeding  Lichen sclerosus - Plan: betamethasone  valerate ointment (VALISONE ) 0.1 % twice weekly. Using 2-4 times per week.   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 Rare outbreaks. Does not need refill at this time.   Urinary frequency - Plan: Urinalysis,Complete w/RFL Culture. Mild leukocytosis, neg nitrites. Culture pending. Increase water intake.   Screening for cervical cancer -  Normal Pap history.  No longer screening per guidelines.   Screening for breast  cancer - Normal mammogram history.  Continue annual screenings.  Normal breast exam today.  Screening for colon cancer - 2010 colonoscopy. Screenings no longer recommended per GI.   Return in 2 years for breast and pelvic exam. She is aware of high risk status due to HSV but would prefer coming every other year and I am agreeable.       Andee Bamberger DNP, 10:45 AM 08/29/2023

## 2023-08-31 ENCOUNTER — Ambulatory Visit: Payer: Self-pay | Admitting: Radiology

## 2023-08-31 LAB — URINALYSIS, COMPLETE W/RFL CULTURE
Bilirubin Urine: NEGATIVE
Glucose, UA: NEGATIVE
Ketones, ur: NEGATIVE
Nitrites, Initial: NEGATIVE
Protein, ur: NEGATIVE
Specific Gravity, Urine: 1.02 (ref 1.001–1.035)
pH: 6 (ref 5.0–8.0)

## 2023-08-31 LAB — URINE CULTURE
MICRO NUMBER:: 16595828
Result:: NO GROWTH
SPECIMEN QUALITY:: ADEQUATE

## 2023-08-31 LAB — CULTURE INDICATED

## 2023-09-06 ENCOUNTER — Telehealth: Payer: Self-pay | Admitting: *Deleted

## 2023-09-06 NOTE — Telephone Encounter (Signed)
 Call returned to patient. Patient was seen in office 08/29/23, urine culture negative. Patient is asking if any treatment needed for abnormal UA?   Inquired if any new symptoms have developed since OV, patient denies any new symptoms. Patient initially reported urinary frequency at night, states she has always had this, is less frequent since OV. Denies flank pain, fever/chills, N/V, dysuria. Patient asking if there is anything additional she needs to do?   Encouraged patient to increase water intake and return call to office if any new symptoms develop. Advised I will forward to covering provider and return call if any additional recommendations. Patient agreeable.   Routing to provider for final review. Patient is agreeable to disposition. Will close encounter.

## 2023-10-23 DIAGNOSIS — R8271 Bacteriuria: Secondary | ICD-10-CM | POA: Diagnosis not present

## 2023-10-23 DIAGNOSIS — Z8551 Personal history of malignant neoplasm of bladder: Secondary | ICD-10-CM | POA: Diagnosis not present

## 2023-11-21 DIAGNOSIS — H04123 Dry eye syndrome of bilateral lacrimal glands: Secondary | ICD-10-CM | POA: Diagnosis not present

## 2023-11-21 DIAGNOSIS — E119 Type 2 diabetes mellitus without complications: Secondary | ICD-10-CM | POA: Diagnosis not present

## 2023-11-21 DIAGNOSIS — H43813 Vitreous degeneration, bilateral: Secondary | ICD-10-CM | POA: Diagnosis not present

## 2023-11-21 DIAGNOSIS — Z961 Presence of intraocular lens: Secondary | ICD-10-CM | POA: Diagnosis not present

## 2023-12-04 DIAGNOSIS — Z23 Encounter for immunization: Secondary | ICD-10-CM | POA: Diagnosis not present
